# Patient Record
Sex: Male | Born: 1940 | Race: White | Hispanic: No | Smoking: Current every day smoker
Health system: Southern US, Community
[De-identification: ages and names within clinical notes are randomized; demographics above are authoritative.]

## PROBLEM LIST (undated history)

## (undated) DIAGNOSIS — I219 Acute myocardial infarction, unspecified: Secondary | ICD-10-CM

## (undated) DIAGNOSIS — I639 Cerebral infarction, unspecified: Secondary | ICD-10-CM

## (undated) DIAGNOSIS — I2510 Atherosclerotic heart disease of native coronary artery without angina pectoris: Secondary | ICD-10-CM

## (undated) DIAGNOSIS — I10 Essential (primary) hypertension: Secondary | ICD-10-CM

## (undated) DIAGNOSIS — I480 Paroxysmal atrial fibrillation: Secondary | ICD-10-CM

## (undated) DIAGNOSIS — E785 Hyperlipidemia, unspecified: Secondary | ICD-10-CM

## (undated) DIAGNOSIS — I739 Peripheral vascular disease, unspecified: Secondary | ICD-10-CM

## (undated) DIAGNOSIS — F0390 Unspecified dementia without behavioral disturbance: Secondary | ICD-10-CM

## (undated) DIAGNOSIS — N189 Chronic kidney disease, unspecified: Secondary | ICD-10-CM

## (undated) DIAGNOSIS — J449 Chronic obstructive pulmonary disease, unspecified: Secondary | ICD-10-CM

## (undated) DIAGNOSIS — I509 Heart failure, unspecified: Secondary | ICD-10-CM

## (undated) DIAGNOSIS — I6529 Occlusion and stenosis of unspecified carotid artery: Secondary | ICD-10-CM

## (undated) DIAGNOSIS — Z720 Tobacco use: Secondary | ICD-10-CM

## (undated) HISTORY — DX: Acute myocardial infarction, unspecified: I21.9

## (undated) HISTORY — DX: Chronic obstructive pulmonary disease, unspecified: J44.9

## (undated) HISTORY — DX: Heart failure, unspecified: I50.9

## (undated) HISTORY — DX: Hyperlipidemia, unspecified: E78.5

## (undated) HISTORY — DX: Occlusion and stenosis of unspecified carotid artery: I65.29

## (undated) HISTORY — DX: Chronic kidney disease, unspecified: N18.9

## (undated) HISTORY — DX: Essential (primary) hypertension: I10

---

## 1998-06-01 ENCOUNTER — Encounter: Payer: Self-pay | Admitting: Internal Medicine

## 1998-06-01 ENCOUNTER — Ambulatory Visit (HOSPITAL_COMMUNITY): Admission: RE | Admit: 1998-06-01 | Discharge: 1998-06-01 | Payer: Self-pay | Admitting: Internal Medicine

## 1998-07-20 ENCOUNTER — Ambulatory Visit (HOSPITAL_COMMUNITY): Admission: RE | Admit: 1998-07-20 | Discharge: 1998-07-20 | Payer: Self-pay | Admitting: *Deleted

## 2000-06-15 ENCOUNTER — Other Ambulatory Visit: Admission: RE | Admit: 2000-06-15 | Discharge: 2000-06-15 | Payer: Self-pay | Admitting: Urology

## 2000-06-15 ENCOUNTER — Encounter (INDEPENDENT_AMBULATORY_CARE_PROVIDER_SITE_OTHER): Payer: Self-pay | Admitting: Specialist

## 2001-03-08 ENCOUNTER — Encounter: Payer: Self-pay | Admitting: Internal Medicine

## 2001-03-08 ENCOUNTER — Encounter: Admission: RE | Admit: 2001-03-08 | Discharge: 2001-03-08 | Payer: Self-pay | Admitting: Internal Medicine

## 2001-08-13 ENCOUNTER — Encounter: Admission: RE | Admit: 2001-08-13 | Discharge: 2001-08-13 | Payer: Self-pay | Admitting: Specialist

## 2001-08-13 ENCOUNTER — Encounter: Payer: Self-pay | Admitting: Specialist

## 2001-12-26 ENCOUNTER — Inpatient Hospital Stay (HOSPITAL_COMMUNITY): Admission: RE | Admit: 2001-12-26 | Discharge: 2002-01-08 | Payer: Self-pay | Admitting: Surgery

## 2001-12-27 ENCOUNTER — Encounter (INDEPENDENT_AMBULATORY_CARE_PROVIDER_SITE_OTHER): Payer: Self-pay | Admitting: Cardiology

## 2002-01-02 ENCOUNTER — Encounter: Payer: Self-pay | Admitting: Surgery

## 2002-01-04 ENCOUNTER — Encounter: Payer: Self-pay | Admitting: Surgery

## 2002-01-04 HISTORY — PX: OTHER SURGICAL HISTORY: SHX169

## 2002-01-05 ENCOUNTER — Encounter: Payer: Self-pay | Admitting: Surgery

## 2002-01-06 ENCOUNTER — Encounter: Payer: Self-pay | Admitting: Surgery

## 2002-01-29 ENCOUNTER — Encounter: Admission: RE | Admit: 2002-01-29 | Discharge: 2002-01-29 | Payer: Self-pay | Admitting: Surgery

## 2002-01-29 ENCOUNTER — Encounter: Payer: Self-pay | Admitting: Surgery

## 2002-04-10 ENCOUNTER — Encounter: Payer: Self-pay | Admitting: Cardiovascular Disease

## 2002-04-10 ENCOUNTER — Inpatient Hospital Stay (HOSPITAL_COMMUNITY): Admission: EM | Admit: 2002-04-10 | Discharge: 2002-04-13 | Payer: Self-pay | Admitting: Emergency Medicine

## 2002-07-10 ENCOUNTER — Ambulatory Visit (HOSPITAL_COMMUNITY): Admission: RE | Admit: 2002-07-10 | Discharge: 2002-07-10 | Payer: Self-pay | Admitting: Cardiovascular Disease

## 2002-07-17 ENCOUNTER — Encounter (INDEPENDENT_AMBULATORY_CARE_PROVIDER_SITE_OTHER): Payer: Self-pay | Admitting: Specialist

## 2002-07-17 ENCOUNTER — Ambulatory Visit (HOSPITAL_COMMUNITY): Admission: RE | Admit: 2002-07-17 | Discharge: 2002-07-17 | Payer: Self-pay | Admitting: *Deleted

## 2002-09-10 ENCOUNTER — Encounter (INDEPENDENT_AMBULATORY_CARE_PROVIDER_SITE_OTHER): Payer: Self-pay | Admitting: Cardiology

## 2002-09-10 ENCOUNTER — Inpatient Hospital Stay (HOSPITAL_COMMUNITY): Admission: AD | Admit: 2002-09-10 | Discharge: 2002-09-18 | Payer: Self-pay

## 2002-09-10 ENCOUNTER — Encounter: Payer: Self-pay | Admitting: Cardiovascular Disease

## 2002-09-13 ENCOUNTER — Encounter: Payer: Self-pay | Admitting: Cardiovascular Disease

## 2002-09-17 ENCOUNTER — Encounter: Payer: Self-pay | Admitting: Cardiovascular Disease

## 2003-04-28 ENCOUNTER — Ambulatory Visit (HOSPITAL_COMMUNITY): Admission: RE | Admit: 2003-04-28 | Discharge: 2003-04-28 | Payer: Self-pay | Admitting: Neurosurgery

## 2003-07-11 ENCOUNTER — Encounter: Admission: RE | Admit: 2003-07-11 | Discharge: 2003-07-11 | Payer: Self-pay | Admitting: Neurosurgery

## 2003-10-06 ENCOUNTER — Ambulatory Visit (HOSPITAL_COMMUNITY): Admission: RE | Admit: 2003-10-06 | Discharge: 2003-10-06 | Payer: Self-pay | Admitting: *Deleted

## 2003-10-06 ENCOUNTER — Encounter (INDEPENDENT_AMBULATORY_CARE_PROVIDER_SITE_OTHER): Payer: Self-pay | Admitting: *Deleted

## 2003-11-03 ENCOUNTER — Ambulatory Visit: Admission: RE | Admit: 2003-11-03 | Discharge: 2003-11-03 | Payer: Self-pay | Admitting: Internal Medicine

## 2004-02-11 ENCOUNTER — Encounter: Admission: RE | Admit: 2004-02-11 | Discharge: 2004-02-11 | Payer: Self-pay | Admitting: Cardiovascular Disease

## 2004-02-16 ENCOUNTER — Ambulatory Visit (HOSPITAL_COMMUNITY): Admission: RE | Admit: 2004-02-16 | Discharge: 2004-02-16 | Payer: Self-pay | Admitting: Cardiovascular Disease

## 2004-04-11 ENCOUNTER — Emergency Department (HOSPITAL_COMMUNITY): Admission: EM | Admit: 2004-04-11 | Discharge: 2004-04-11 | Payer: Self-pay | Admitting: Emergency Medicine

## 2004-06-04 ENCOUNTER — Encounter: Admission: RE | Admit: 2004-06-04 | Discharge: 2004-06-04 | Payer: Self-pay | Admitting: Neurosurgery

## 2004-06-08 ENCOUNTER — Ambulatory Visit (HOSPITAL_COMMUNITY): Admission: RE | Admit: 2004-06-08 | Discharge: 2004-06-08 | Payer: Self-pay | Admitting: Neurosurgery

## 2004-06-17 ENCOUNTER — Ambulatory Visit (HOSPITAL_COMMUNITY): Admission: RE | Admit: 2004-06-17 | Discharge: 2004-06-17 | Payer: Self-pay | Admitting: Neurosurgery

## 2004-06-23 ENCOUNTER — Encounter: Admission: RE | Admit: 2004-06-23 | Discharge: 2004-06-23 | Payer: Self-pay | Admitting: Internal Medicine

## 2004-07-21 ENCOUNTER — Encounter: Admission: RE | Admit: 2004-07-21 | Discharge: 2004-07-21 | Payer: Self-pay | Admitting: Internal Medicine

## 2004-11-12 ENCOUNTER — Encounter: Admission: RE | Admit: 2004-11-12 | Discharge: 2004-11-12 | Payer: Self-pay | Admitting: Nephrology

## 2004-11-18 ENCOUNTER — Ambulatory Visit (HOSPITAL_COMMUNITY): Admission: RE | Admit: 2004-11-18 | Discharge: 2004-11-18 | Payer: Self-pay | Admitting: *Deleted

## 2004-11-18 ENCOUNTER — Encounter (INDEPENDENT_AMBULATORY_CARE_PROVIDER_SITE_OTHER): Payer: Self-pay | Admitting: *Deleted

## 2005-01-10 ENCOUNTER — Inpatient Hospital Stay (HOSPITAL_COMMUNITY): Admission: AD | Admit: 2005-01-10 | Discharge: 2005-01-12 | Payer: Self-pay | Admitting: Cardiovascular Disease

## 2005-07-02 ENCOUNTER — Emergency Department (HOSPITAL_COMMUNITY): Admission: EM | Admit: 2005-07-02 | Discharge: 2005-07-02 | Payer: Self-pay | Admitting: Emergency Medicine

## 2005-08-05 ENCOUNTER — Inpatient Hospital Stay (HOSPITAL_COMMUNITY): Admission: EM | Admit: 2005-08-05 | Discharge: 2005-08-07 | Payer: Self-pay | Admitting: Emergency Medicine

## 2005-11-03 ENCOUNTER — Emergency Department (HOSPITAL_COMMUNITY): Admission: EM | Admit: 2005-11-03 | Discharge: 2005-11-03 | Payer: Self-pay | Admitting: Emergency Medicine

## 2006-02-23 ENCOUNTER — Inpatient Hospital Stay (HOSPITAL_COMMUNITY): Admission: EM | Admit: 2006-02-23 | Discharge: 2006-02-25 | Payer: Self-pay | Admitting: Emergency Medicine

## 2006-05-23 ENCOUNTER — Encounter: Admission: RE | Admit: 2006-05-23 | Discharge: 2006-05-23 | Payer: Self-pay | Admitting: *Deleted

## 2006-07-12 ENCOUNTER — Encounter (INDEPENDENT_AMBULATORY_CARE_PROVIDER_SITE_OTHER): Payer: Self-pay | Admitting: Specialist

## 2006-07-12 ENCOUNTER — Ambulatory Visit (HOSPITAL_COMMUNITY): Admission: RE | Admit: 2006-07-12 | Discharge: 2006-07-12 | Payer: Self-pay | Admitting: *Deleted

## 2006-07-24 ENCOUNTER — Encounter: Payer: Self-pay | Admitting: Infectious Diseases

## 2006-08-09 ENCOUNTER — Encounter: Admission: RE | Admit: 2006-08-09 | Discharge: 2006-08-09 | Payer: Self-pay | Admitting: Neurosurgery

## 2006-09-29 ENCOUNTER — Ambulatory Visit (HOSPITAL_COMMUNITY): Admission: RE | Admit: 2006-09-29 | Discharge: 2006-09-29 | Payer: Self-pay | Admitting: Neurosurgery

## 2006-10-04 ENCOUNTER — Ambulatory Visit: Payer: Self-pay | Admitting: Infectious Diseases

## 2006-10-04 DIAGNOSIS — Z951 Presence of aortocoronary bypass graft: Secondary | ICD-10-CM | POA: Insufficient documentation

## 2006-10-04 DIAGNOSIS — E118 Type 2 diabetes mellitus with unspecified complications: Secondary | ICD-10-CM | POA: Insufficient documentation

## 2006-10-04 DIAGNOSIS — Z981 Arthrodesis status: Secondary | ICD-10-CM

## 2006-10-04 DIAGNOSIS — K219 Gastro-esophageal reflux disease without esophagitis: Secondary | ICD-10-CM | POA: Insufficient documentation

## 2006-10-04 DIAGNOSIS — N189 Chronic kidney disease, unspecified: Secondary | ICD-10-CM | POA: Insufficient documentation

## 2006-10-04 DIAGNOSIS — M869 Osteomyelitis, unspecified: Secondary | ICD-10-CM | POA: Insufficient documentation

## 2006-10-04 LAB — CONVERTED CEMR LAB
BUN: 51 mg/dL — ABNORMAL HIGH (ref 6–23)
CO2: 24 meq/L (ref 19–32)
CRP: 3.2 mg/dL — ABNORMAL HIGH (ref <0.6)
Calcium: 9 mg/dL (ref 8.4–10.5)
Chloride: 108 meq/L (ref 96–112)
Creatinine, Ser: 2.24 mg/dL — ABNORMAL HIGH (ref 0.40–1.50)
Eosinophils Absolute: 0.3 10*3/uL (ref 0.0–0.7)
Eosinophils Relative: 4 % (ref 0–5)
Glucose, Bld: 98 mg/dL (ref 70–99)
HCT: 43.5 % (ref 39.0–52.0)
Hemoglobin: 14.2 g/dL (ref 13.0–17.0)
Lymphs Abs: 2.2 10*3/uL (ref 0.7–3.3)
MCV: 103.3 fL — ABNORMAL HIGH (ref 78.0–100.0)
Monocytes Absolute: 0.6 10*3/uL (ref 0.2–0.7)
Monocytes Relative: 8 % (ref 3–11)
Platelets: 174 10*3/uL (ref 150–400)
Sed Rate: 23 mm/hr — ABNORMAL HIGH (ref 0–16)
Total Bilirubin: 0.5 mg/dL (ref 0.3–1.2)
WBC: 7.5 10*3/uL (ref 4.0–10.5)

## 2006-10-05 ENCOUNTER — Telehealth: Payer: Self-pay

## 2006-10-05 ENCOUNTER — Ambulatory Visit (HOSPITAL_COMMUNITY): Admission: RE | Admit: 2006-10-05 | Discharge: 2006-10-05 | Payer: Self-pay | Admitting: *Deleted

## 2006-10-05 ENCOUNTER — Encounter: Payer: Self-pay | Admitting: Infectious Diseases

## 2006-10-06 ENCOUNTER — Telehealth: Payer: Self-pay | Admitting: Infectious Diseases

## 2006-10-06 ENCOUNTER — Ambulatory Visit (HOSPITAL_COMMUNITY): Admission: RE | Admit: 2006-10-06 | Discharge: 2006-10-06 | Payer: Self-pay | Admitting: Infectious Diseases

## 2006-10-10 ENCOUNTER — Telehealth: Payer: Self-pay | Admitting: Infectious Diseases

## 2006-10-10 ENCOUNTER — Encounter: Payer: Self-pay | Admitting: Infectious Diseases

## 2006-10-11 ENCOUNTER — Encounter (HOSPITAL_COMMUNITY): Admission: RE | Admit: 2006-10-11 | Discharge: 2006-12-04 | Payer: Self-pay | Admitting: Infectious Diseases

## 2006-10-13 ENCOUNTER — Encounter: Payer: Self-pay | Admitting: Infectious Diseases

## 2006-10-16 ENCOUNTER — Telehealth: Payer: Self-pay | Admitting: Infectious Diseases

## 2006-10-24 ENCOUNTER — Telehealth: Payer: Self-pay | Admitting: Infectious Diseases

## 2006-10-30 ENCOUNTER — Encounter: Payer: Self-pay | Admitting: Infectious Diseases

## 2006-10-31 ENCOUNTER — Encounter: Payer: Self-pay | Admitting: Infectious Diseases

## 2006-11-07 ENCOUNTER — Encounter: Payer: Self-pay | Admitting: Infectious Diseases

## 2006-11-08 ENCOUNTER — Encounter (INDEPENDENT_AMBULATORY_CARE_PROVIDER_SITE_OTHER): Payer: Self-pay | Admitting: Internal Medicine

## 2006-11-08 ENCOUNTER — Observation Stay (HOSPITAL_COMMUNITY): Admission: EM | Admit: 2006-11-08 | Discharge: 2006-11-09 | Payer: Self-pay | Admitting: Emergency Medicine

## 2006-11-09 ENCOUNTER — Telehealth: Payer: Self-pay | Admitting: Infectious Diseases

## 2006-11-10 ENCOUNTER — Encounter: Payer: Self-pay | Admitting: Infectious Diseases

## 2006-11-14 ENCOUNTER — Encounter: Payer: Self-pay | Admitting: Infectious Diseases

## 2006-11-14 ENCOUNTER — Telehealth: Payer: Self-pay | Admitting: Infectious Diseases

## 2006-11-20 ENCOUNTER — Encounter: Admission: RE | Admit: 2006-11-20 | Discharge: 2006-11-20 | Payer: Self-pay | Admitting: Podiatry

## 2006-11-21 ENCOUNTER — Telehealth: Payer: Self-pay | Admitting: Infectious Diseases

## 2006-11-21 ENCOUNTER — Encounter: Payer: Self-pay | Admitting: Infectious Diseases

## 2006-12-25 ENCOUNTER — Ambulatory Visit: Payer: Self-pay | Admitting: Infectious Diseases

## 2006-12-25 LAB — CONVERTED CEMR LAB
BUN: 26 mg/dL — ABNORMAL HIGH (ref 6–23)
Basophils Absolute: 0 10*3/uL (ref 0.0–0.1)
CO2: 25 meq/L (ref 19–32)
Chloride: 103 meq/L (ref 96–112)
Creatinine, Ser: 1.55 mg/dL — ABNORMAL HIGH (ref 0.40–1.50)
Eosinophils Relative: 1 % (ref 0–5)
HCT: 45.7 % (ref 39.0–52.0)
Hemoglobin: 15.5 g/dL (ref 13.0–17.0)
Lymphocytes Relative: 22 % (ref 12–46)
Lymphs Abs: 2 10*3/uL (ref 0.7–3.3)
Monocytes Absolute: 0.6 10*3/uL (ref 0.2–0.7)
Neutro Abs: 6.5 10*3/uL (ref 1.7–7.7)
Potassium: 4.6 meq/L (ref 3.5–5.3)
RBC: 4.54 M/uL (ref 4.22–5.81)
Sed Rate: 15 mm/hr (ref 0–16)
WBC: 9.2 10*3/uL (ref 4.0–10.5)

## 2006-12-26 ENCOUNTER — Encounter: Admission: RE | Admit: 2006-12-26 | Discharge: 2006-12-26 | Payer: Self-pay | Admitting: Neurosurgery

## 2007-01-03 ENCOUNTER — Encounter: Admission: RE | Admit: 2007-01-03 | Discharge: 2007-01-03 | Payer: Self-pay | Admitting: Internal Medicine

## 2007-01-12 ENCOUNTER — Encounter: Payer: Self-pay | Admitting: Infectious Diseases

## 2007-07-12 ENCOUNTER — Encounter: Admission: RE | Admit: 2007-07-12 | Discharge: 2007-07-12 | Payer: Self-pay | Admitting: Cardiovascular Disease

## 2007-07-17 ENCOUNTER — Inpatient Hospital Stay (HOSPITAL_COMMUNITY): Admission: RE | Admit: 2007-07-17 | Discharge: 2007-07-27 | Payer: Self-pay | Admitting: Cardiovascular Disease

## 2007-07-17 ENCOUNTER — Encounter: Payer: Self-pay | Admitting: Infectious Diseases

## 2007-07-17 ENCOUNTER — Ambulatory Visit: Payer: Self-pay | Admitting: Infectious Diseases

## 2007-07-17 ENCOUNTER — Ambulatory Visit: Payer: Self-pay | Admitting: Vascular Surgery

## 2007-08-14 ENCOUNTER — Ambulatory Visit (HOSPITAL_COMMUNITY): Admission: RE | Admit: 2007-08-14 | Discharge: 2007-08-14 | Payer: Self-pay | Admitting: Infectious Diseases

## 2007-08-14 ENCOUNTER — Ambulatory Visit: Payer: Self-pay | Admitting: Infectious Diseases

## 2007-08-14 DIAGNOSIS — L0390 Cellulitis, unspecified: Secondary | ICD-10-CM

## 2007-08-14 DIAGNOSIS — L0291 Cutaneous abscess, unspecified: Secondary | ICD-10-CM | POA: Insufficient documentation

## 2007-08-14 LAB — CONVERTED CEMR LAB
Alkaline Phosphatase: 145 units/L — ABNORMAL HIGH (ref 39–117)
BUN: 36 mg/dL — ABNORMAL HIGH (ref 6–23)
Basophils Relative: 1 % (ref 0–1)
CO2: 24 meq/L (ref 19–32)
Creatinine, Ser: 1.61 mg/dL — ABNORMAL HIGH (ref 0.40–1.50)
Eosinophils Absolute: 0.3 10*3/uL (ref 0.0–0.7)
Eosinophils Relative: 4 % (ref 0–5)
Glucose, Bld: 123 mg/dL — ABNORMAL HIGH (ref 70–99)
HCT: 45.7 % (ref 39.0–52.0)
Lymphs Abs: 2.9 10*3/uL (ref 0.7–4.0)
MCHC: 33.9 g/dL (ref 30.0–36.0)
MCV: 96.8 fL (ref 78.0–100.0)
Monocytes Relative: 8 % (ref 3–12)
Neutrophils Relative %: 48 % (ref 43–77)
RBC: 4.72 M/uL (ref 4.22–5.81)
Total Bilirubin: 1 mg/dL (ref 0.3–1.2)
WBC: 7.5 10*3/uL (ref 4.0–10.5)

## 2007-08-15 ENCOUNTER — Encounter: Payer: Self-pay | Admitting: Infectious Diseases

## 2007-08-20 ENCOUNTER — Telehealth: Payer: Self-pay | Admitting: Infectious Diseases

## 2007-08-24 ENCOUNTER — Telehealth: Payer: Self-pay | Admitting: Infectious Diseases

## 2007-09-13 ENCOUNTER — Telehealth: Payer: Self-pay | Admitting: Infectious Diseases

## 2007-09-18 ENCOUNTER — Ambulatory Visit (HOSPITAL_COMMUNITY): Admission: RE | Admit: 2007-09-18 | Discharge: 2007-09-18 | Payer: Self-pay | Admitting: Infectious Diseases

## 2007-09-18 ENCOUNTER — Ambulatory Visit: Payer: Self-pay | Admitting: Infectious Diseases

## 2007-09-18 DIAGNOSIS — I739 Peripheral vascular disease, unspecified: Secondary | ICD-10-CM

## 2007-09-18 LAB — CONVERTED CEMR LAB
BUN: 18 mg/dL (ref 6–23)
CO2: 29 meq/L (ref 19–32)
Calcium: 9.2 mg/dL (ref 8.4–10.5)
Eosinophils Absolute: 0.5 10*3/uL (ref 0.0–0.7)
Eosinophils Relative: 7 % — ABNORMAL HIGH (ref 0–5)
Glucose, Bld: 117 mg/dL — ABNORMAL HIGH (ref 70–99)
HCT: 42.7 % (ref 39.0–52.0)
Hemoglobin: 14.2 g/dL (ref 13.0–17.0)
Lymphs Abs: 2.1 10*3/uL (ref 0.7–4.0)
MCHC: 33.3 g/dL (ref 30.0–36.0)
MCV: 101.2 fL — ABNORMAL HIGH (ref 78.0–100.0)
Monocytes Absolute: 0.6 10*3/uL (ref 0.1–1.0)
Monocytes Relative: 9 % (ref 3–12)
Neutrophils Relative %: 54 % (ref 43–77)
RBC: 4.22 M/uL (ref 4.22–5.81)
Sodium: 141 meq/L (ref 135–145)
WBC: 7.2 10*3/uL (ref 4.0–10.5)

## 2007-09-24 ENCOUNTER — Telehealth: Payer: Self-pay | Admitting: Infectious Diseases

## 2007-09-24 ENCOUNTER — Telehealth (INDEPENDENT_AMBULATORY_CARE_PROVIDER_SITE_OTHER): Payer: Self-pay | Admitting: Licensed Clinical Social Worker

## 2007-10-16 ENCOUNTER — Ambulatory Visit: Payer: Self-pay | Admitting: Vascular Surgery

## 2007-11-28 ENCOUNTER — Ambulatory Visit: Payer: Self-pay | Admitting: Infectious Diseases

## 2008-01-14 ENCOUNTER — Telehealth: Payer: Self-pay | Admitting: Infectious Diseases

## 2008-01-31 ENCOUNTER — Emergency Department (HOSPITAL_COMMUNITY): Admission: EM | Admit: 2008-01-31 | Discharge: 2008-01-31 | Payer: Self-pay | Admitting: Emergency Medicine

## 2008-02-26 ENCOUNTER — Ambulatory Visit: Payer: Self-pay | Admitting: Infectious Diseases

## 2008-02-26 ENCOUNTER — Ambulatory Visit (HOSPITAL_COMMUNITY): Admission: RE | Admit: 2008-02-26 | Discharge: 2008-02-26 | Payer: Self-pay | Admitting: Infectious Diseases

## 2008-04-23 ENCOUNTER — Ambulatory Visit (HOSPITAL_COMMUNITY): Admission: RE | Admit: 2008-04-23 | Discharge: 2008-04-23 | Payer: Self-pay | Admitting: *Deleted

## 2008-04-23 ENCOUNTER — Encounter (INDEPENDENT_AMBULATORY_CARE_PROVIDER_SITE_OTHER): Payer: Self-pay | Admitting: *Deleted

## 2008-05-08 ENCOUNTER — Ambulatory Visit: Payer: Self-pay | Admitting: Infectious Diseases

## 2008-07-11 ENCOUNTER — Encounter (HOSPITAL_BASED_OUTPATIENT_CLINIC_OR_DEPARTMENT_OTHER): Admission: RE | Admit: 2008-07-11 | Discharge: 2008-08-08 | Payer: Self-pay | Admitting: General Surgery

## 2008-07-16 ENCOUNTER — Ambulatory Visit (HOSPITAL_COMMUNITY): Admission: RE | Admit: 2008-07-16 | Discharge: 2008-07-16 | Payer: Self-pay | Admitting: General Surgery

## 2008-07-22 ENCOUNTER — Ambulatory Visit (HOSPITAL_COMMUNITY): Admission: RE | Admit: 2008-07-22 | Discharge: 2008-07-22 | Payer: Self-pay | Admitting: General Surgery

## 2008-07-26 ENCOUNTER — Inpatient Hospital Stay (HOSPITAL_COMMUNITY): Admission: EM | Admit: 2008-07-26 | Discharge: 2008-08-13 | Payer: Self-pay | Admitting: Emergency Medicine

## 2008-07-28 ENCOUNTER — Ambulatory Visit: Payer: Self-pay | Admitting: Vascular Surgery

## 2008-07-29 ENCOUNTER — Encounter: Payer: Self-pay | Admitting: Vascular Surgery

## 2008-07-29 ENCOUNTER — Encounter (INDEPENDENT_AMBULATORY_CARE_PROVIDER_SITE_OTHER): Payer: Self-pay | Admitting: Cardiology

## 2008-07-31 ENCOUNTER — Encounter: Payer: Self-pay | Admitting: Vascular Surgery

## 2008-07-31 HISTORY — PX: OTHER SURGICAL HISTORY: SHX169

## 2008-08-01 ENCOUNTER — Ambulatory Visit: Payer: Self-pay | Admitting: Physical Medicine & Rehabilitation

## 2008-08-07 ENCOUNTER — Encounter (INDEPENDENT_AMBULATORY_CARE_PROVIDER_SITE_OTHER): Payer: Self-pay | Admitting: Internal Medicine

## 2008-08-12 ENCOUNTER — Encounter (INDEPENDENT_AMBULATORY_CARE_PROVIDER_SITE_OTHER): Payer: Self-pay | Admitting: Cardiology

## 2008-08-19 ENCOUNTER — Ambulatory Visit: Payer: Self-pay | Admitting: Vascular Surgery

## 2008-10-09 ENCOUNTER — Inpatient Hospital Stay (HOSPITAL_COMMUNITY): Admission: EM | Admit: 2008-10-09 | Discharge: 2008-10-22 | Payer: Self-pay | Admitting: Emergency Medicine

## 2008-10-10 ENCOUNTER — Ambulatory Visit: Payer: Self-pay | Admitting: Infectious Diseases

## 2008-10-16 ENCOUNTER — Telehealth: Payer: Self-pay | Admitting: Infectious Diseases

## 2008-10-16 ENCOUNTER — Encounter (INDEPENDENT_AMBULATORY_CARE_PROVIDER_SITE_OTHER): Payer: Self-pay | Admitting: Internal Medicine

## 2008-10-17 ENCOUNTER — Encounter: Payer: Self-pay | Admitting: Internal Medicine

## 2008-10-27 ENCOUNTER — Telehealth: Payer: Self-pay | Admitting: Internal Medicine

## 2008-11-07 ENCOUNTER — Ambulatory Visit (HOSPITAL_COMMUNITY): Admission: RE | Admit: 2008-11-07 | Discharge: 2008-11-07 | Payer: Self-pay | Admitting: Internal Medicine

## 2008-11-12 ENCOUNTER — Telehealth: Payer: Self-pay | Admitting: Internal Medicine

## 2008-11-21 ENCOUNTER — Encounter: Admission: RE | Admit: 2008-11-21 | Discharge: 2009-01-13 | Payer: Self-pay | Admitting: Internal Medicine

## 2008-11-27 ENCOUNTER — Ambulatory Visit: Payer: Self-pay | Admitting: Internal Medicine

## 2008-11-27 DIAGNOSIS — A4102 Sepsis due to Methicillin resistant Staphylococcus aureus: Secondary | ICD-10-CM

## 2008-11-27 LAB — CONVERTED CEMR LAB
CO2: 26 meq/L (ref 19–32)
Calcium: 8.6 mg/dL (ref 8.4–10.5)
Creatinine, Ser: 0.88 mg/dL (ref 0.40–1.50)
Glucose, Bld: 144 mg/dL — ABNORMAL HIGH (ref 70–99)
Sed Rate: 76 mm/hr — ABNORMAL HIGH (ref 0–16)

## 2008-12-15 ENCOUNTER — Telehealth: Payer: Self-pay

## 2008-12-23 ENCOUNTER — Ambulatory Visit: Payer: Self-pay | Admitting: Internal Medicine

## 2008-12-23 ENCOUNTER — Encounter: Payer: Self-pay | Admitting: Infectious Diseases

## 2009-01-09 ENCOUNTER — Encounter (HOSPITAL_COMMUNITY): Admission: RE | Admit: 2009-01-09 | Discharge: 2009-01-13 | Payer: Self-pay | Admitting: Internal Medicine

## 2009-01-22 ENCOUNTER — Ambulatory Visit: Payer: Self-pay | Admitting: Internal Medicine

## 2009-03-24 ENCOUNTER — Ambulatory Visit: Payer: Self-pay | Admitting: Internal Medicine

## 2009-03-25 ENCOUNTER — Encounter: Payer: Self-pay | Admitting: Internal Medicine

## 2009-04-23 ENCOUNTER — Telehealth: Payer: Self-pay | Admitting: Internal Medicine

## 2009-04-23 ENCOUNTER — Ambulatory Visit: Payer: Self-pay | Admitting: Internal Medicine

## 2009-04-23 ENCOUNTER — Ambulatory Visit (HOSPITAL_COMMUNITY): Admission: RE | Admit: 2009-04-23 | Discharge: 2009-04-23 | Payer: Self-pay | Admitting: Internal Medicine

## 2009-04-23 LAB — CONVERTED CEMR LAB
BUN: 25 mg/dL — ABNORMAL HIGH (ref 6–23)
CO2: 24 meq/L (ref 19–32)
Chloride: 106 meq/L (ref 96–112)
Creatinine, Ser: 1.33 mg/dL (ref 0.40–1.50)
HCT: 37.5 % — ABNORMAL LOW (ref 39.0–52.0)
MCV: 98.9 fL (ref >=78.0)
RBC: 3.79 M/uL — ABNORMAL LOW (ref 4.22–5.81)
WBC: 5.8 10*3/uL (ref 4.0–10.5)

## 2009-04-30 ENCOUNTER — Ambulatory Visit: Payer: Self-pay | Admitting: Vascular Surgery

## 2009-04-30 ENCOUNTER — Encounter: Payer: Self-pay | Admitting: Infectious Diseases

## 2009-05-05 ENCOUNTER — Ambulatory Visit: Payer: Self-pay | Admitting: Internal Medicine

## 2009-06-05 ENCOUNTER — Encounter: Admission: RE | Admit: 2009-06-05 | Discharge: 2009-06-05 | Payer: Self-pay | Admitting: Gastroenterology

## 2009-07-07 ENCOUNTER — Ambulatory Visit: Payer: Self-pay | Admitting: Internal Medicine

## 2009-07-07 DIAGNOSIS — L89509 Pressure ulcer of unspecified ankle, unspecified stage: Secondary | ICD-10-CM | POA: Insufficient documentation

## 2009-08-26 ENCOUNTER — Encounter: Admission: RE | Admit: 2009-08-26 | Discharge: 2009-08-26 | Payer: Self-pay

## 2009-09-24 ENCOUNTER — Ambulatory Visit: Payer: Self-pay | Admitting: Vascular Surgery

## 2009-11-04 ENCOUNTER — Ambulatory Visit: Payer: Self-pay | Admitting: Vascular Surgery

## 2009-12-02 ENCOUNTER — Inpatient Hospital Stay (HOSPITAL_COMMUNITY)
Admission: EM | Admit: 2009-12-02 | Discharge: 2009-12-14 | Payer: Self-pay | Source: Home / Self Care | Admitting: Emergency Medicine

## 2009-12-03 ENCOUNTER — Ambulatory Visit: Payer: Self-pay | Admitting: Vascular Surgery

## 2009-12-03 ENCOUNTER — Encounter (INDEPENDENT_AMBULATORY_CARE_PROVIDER_SITE_OTHER): Payer: Self-pay | Admitting: Internal Medicine

## 2009-12-03 ENCOUNTER — Encounter: Payer: Self-pay | Admitting: Vascular Surgery

## 2009-12-04 ENCOUNTER — Encounter (INDEPENDENT_AMBULATORY_CARE_PROVIDER_SITE_OTHER): Payer: Self-pay | Admitting: Internal Medicine

## 2009-12-07 ENCOUNTER — Encounter (INDEPENDENT_AMBULATORY_CARE_PROVIDER_SITE_OTHER): Payer: Self-pay | Admitting: Internal Medicine

## 2009-12-07 HISTORY — PX: OTHER SURGICAL HISTORY: SHX169

## 2009-12-08 ENCOUNTER — Ambulatory Visit: Payer: Self-pay | Admitting: Physical Medicine & Rehabilitation

## 2010-01-06 ENCOUNTER — Ambulatory Visit: Payer: Self-pay | Admitting: Vascular Surgery

## 2010-03-24 ENCOUNTER — Encounter (HOSPITAL_COMMUNITY)
Admission: RE | Admit: 2010-03-24 | Discharge: 2010-04-06 | Payer: Self-pay | Source: Home / Self Care | Attending: Cardiovascular Disease | Admitting: Cardiovascular Disease

## 2010-03-28 ENCOUNTER — Encounter: Payer: Self-pay | Admitting: Neurosurgery

## 2010-03-28 ENCOUNTER — Encounter: Payer: Self-pay | Admitting: Neurology

## 2010-04-01 IMAGING — XA IR FLUORO GUIDE CV LINE*R*
1 series · 3 of 3 positions shown · non-contrast
Comparison: none

CLINICAL DATA: MRSA; retracted right upper extremity PICC line;
request is now made for exchange of existing  PICC for a new PICC

RIGHT UPPER EXTREMITY  PICC EXCHANGE UNDER FLUORO GUIDANCE
TECHNIQUE: The existing right upper extremity PICC entry site was
prepped and draped in the normal sterile fashion.  1% lidocaine was
used for local anesthesia.  The proximal segment of the PICC cath
was cut and a guide wire was placed and advanced to the SVC right
atrial junction.  The existing PICC cath was then removed and
exchanged out over a peel-away sheath for a new 44 cm double lumen
power PICC  with its tip lying in the SVC right atrial junction.
Fluoroscopy during the procedure and fluoro spot radiograph
confirms appropriate catheter position.  The catheter was flushed,
secured to the skin with Prolene sutures, and covered with a
sterile dressing.  No immediate complication.
Fluoroscopy Time: 1.3 minutes.

[Series 1: run · 3 of 3 slices shown]
[im 1/3]
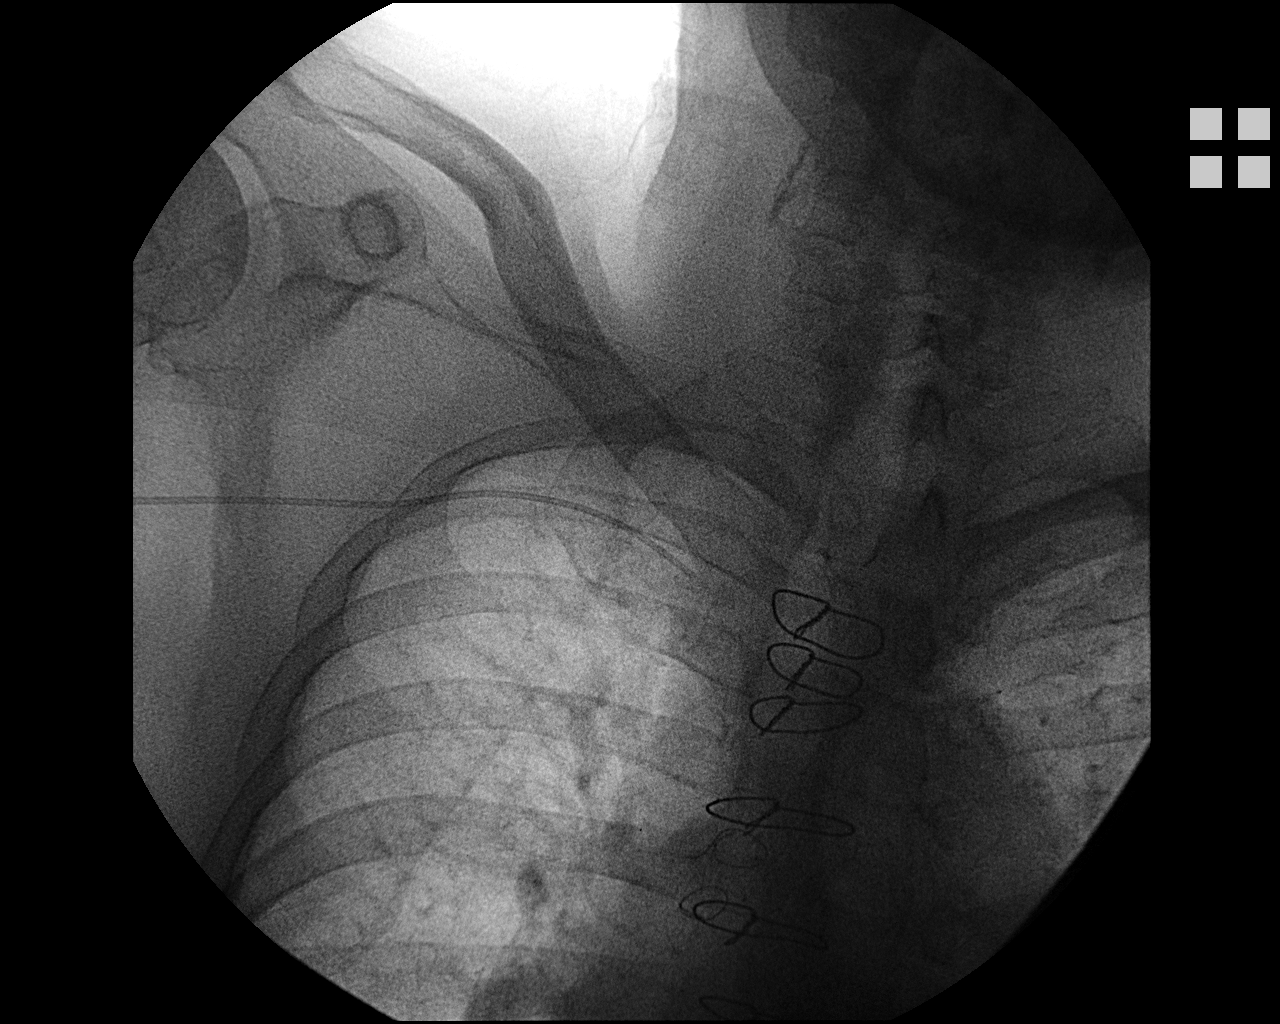
[im 2/3]
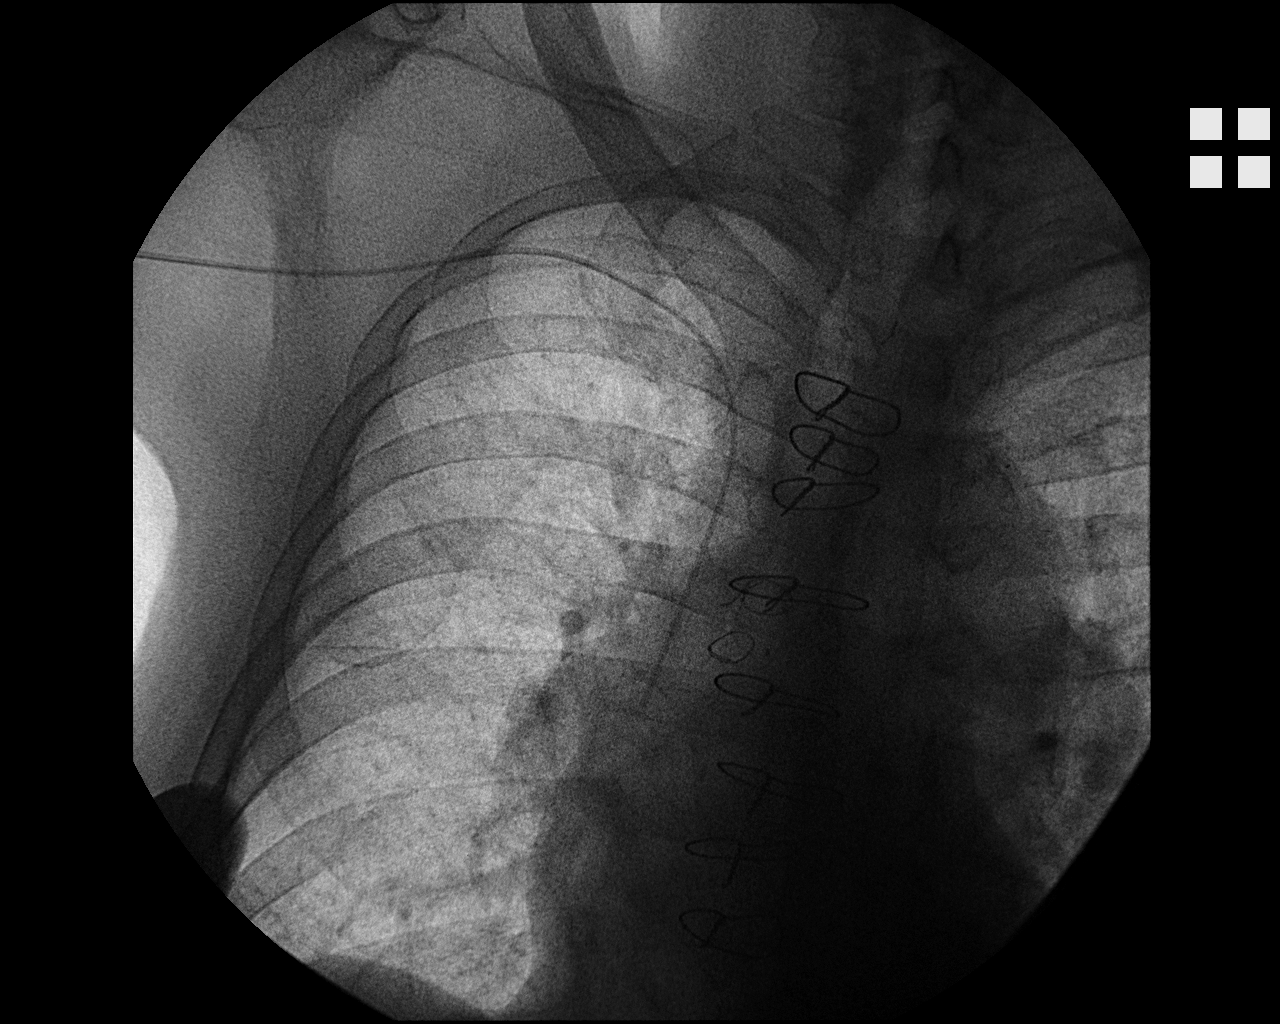
[im 3/3]
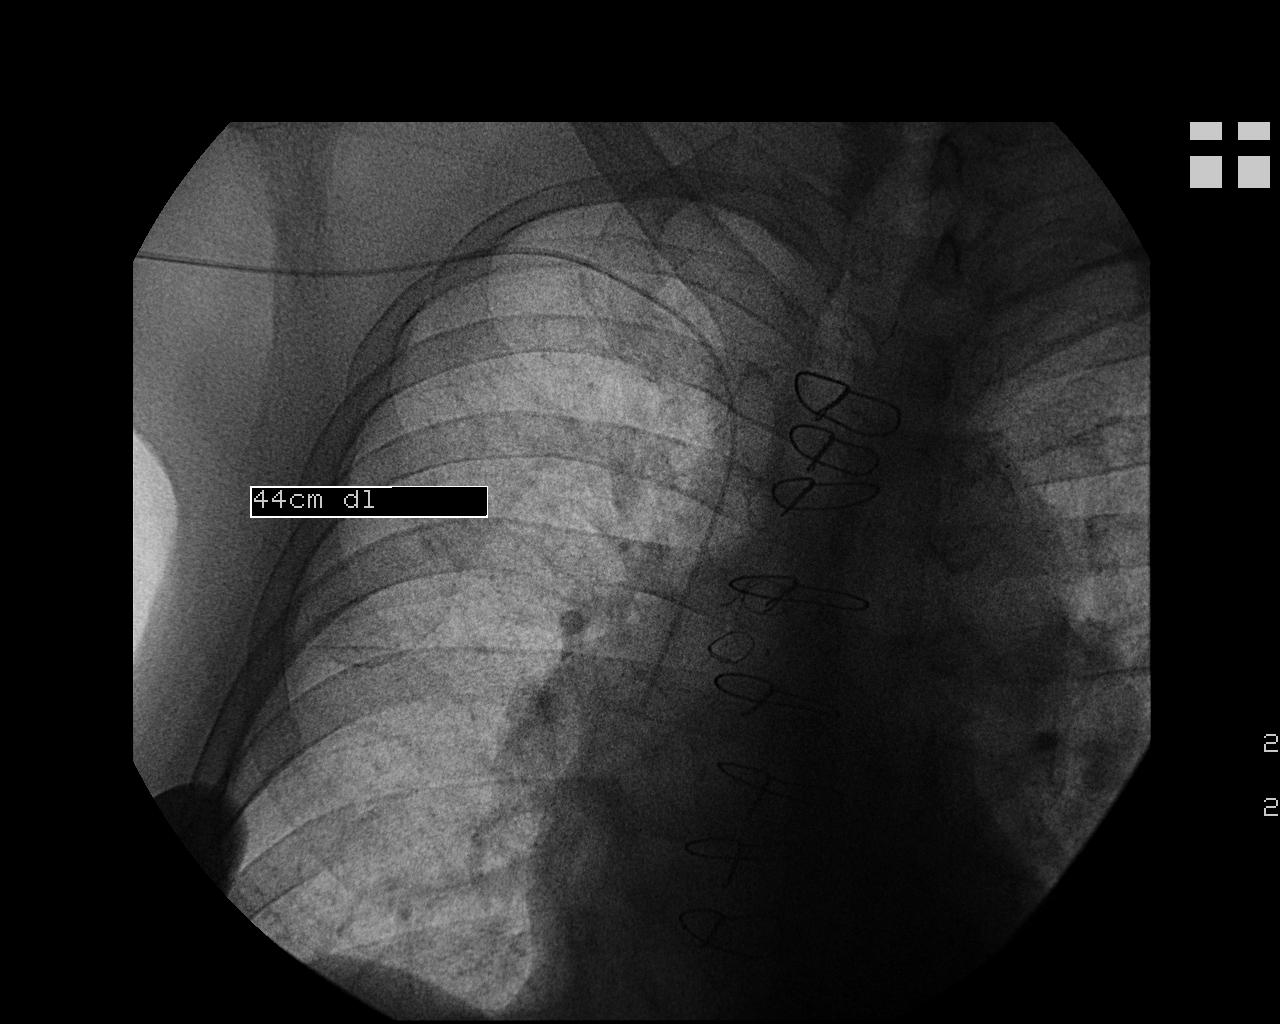

[3 of 3 positions shown; findings below may reference images not displayed]

IMPRESSION: Successful fluoroscopic guided exchange of existing right upper
extremity PICC for a new double lumen power PICC. .  The catheter
is ready for use.

Read by: Mazari, Ibraa.-AURREL

## 2010-04-04 LAB — CONVERTED CEMR LAB: Sed Rate: 16 mm/hr (ref 0–16)

## 2010-04-06 NOTE — Assessment & Plan Note (Signed)
Summary: 6WK F/U/VS   Primary Provider:  [name]  CC:  f/u visit .  History of Present Illness: [name] is in for his routine follow-up visit.  He has now been off of all antibiotics for about 2 1/2 months following nearly 3 months of antibiotic therapy for MRSA bacteremia and thoracic spine infection.  He has been off of his narcotic pain medication for one month.  He lost his balance about 3 weeks ago while climbing the ramp to his house and fell on his left side.  He has had more pain in his left hip since that time.  He has difficulty distinguishing between his various aches and pains but it appears that he has had no significant increase in his back pain.  He has had no fever, chills, or sweats.  Preventive Screening-Counseling & Management  Alcohol-Tobacco     Alcohol drinks/day: 1     Alcohol type: beer     Smoking Status: current     Smoking Cessation Counseling: yes     Packs/Day: 2.0     Passive Smoke Exposure: yes  Caffeine-Diet-Exercise     Caffeine use/day: 3     Does Patient Exercise: not on PT at home  Safety-Violence-Falls     Seat Belt Use: 0   Updated Prior Medication List: AMIODARONE HCL 200 MG  TABS (AMIODARONE HCL) Take 1 tablet by mouth once every other day.  On oppostie day take a half tablet. BENICAR 40 MG TABS (OLMESARTAN MEDOXOMIL) Take 1 tablet by mouth once a day FLOVENT HFA 220 MCG/ACT AERO (FLUTICASONE PROPIONATE  HFA) 1 puff two times a day GABAPENTIN 300 MG CAPS (GABAPENTIN) 1-2 daily HYDROXYZINE HCL 25 MG TABS (HYDROXYZINE HCL) 1-3 as needed itching REMERON 15 MG  TABS (MIRTAZAPINE) one at bedtime NIASPAN 1000 MG  TBCR (NIACIN (ANTIHYPERLIPIDEMIC)) Take 1 tablet by mouth once a day PRAVASTATIN SODIUM 40 MG  TABS (PRAVASTATIN SODIUM) Take 1 tablet by mouth once a day PRISTIQ 50 MG XR24H-TAB (DESVENLAFAXINE SUCCINATE) 1 daily PROTONIX 40 MG  PACK (PANTOPRAZOLE SODIUM) Take 1 tablet by mouth at bedtime ATIVAN 0.5 MG TABS (LORAZEPAM) Take  1/2-1 tablet by mouth two times a day BC FAST PAIN RELIEF 845-65 MG PACK (ASPIRIN-CAFFEINE) Take 1 tablet by mouth three times a day as needed for pain  Current Allergies (reviewed today): ! WELLBUTRIN Vital Signs:  Patient profile:   70 year old male Height:      72 inches (182.88 cm) Weight:      156.8 pounds (71.27 kg) BMI:     21.34 Temp:     97.5 degrees F (36.39 degrees C) oral Pulse (ortho):   67 / minute BP sitting:   171 / 75  (right arm) Cuff size:   large  Vitals Entered By: [name] (March 24, 2009 3:11 PM) CC: f/u visit  Is Patient Diabetic? Yes Did you bring your meter with you today? not checked this morning Pain Assessment Patient in pain? yes     Location: lower back Intensity: 7 Type: aching Onset of pain  Constant Nutritional Status BMI of 19 -24 = normal Nutritional Status Detail appetite "not good"  Have you ever been in a relationship where you felt threatened, hurt or afraid?not asked, daughter present   Does patient need assistance? Functional Status Self care Ambulation Wheelchair   Physical Exam  General:  alert and well-nourished.   Mouth:  poor dentition and teeth missing.   Lungs:  normal breath sounds, no  crackles, and no wheezes.   Heart:  normal rate, regular rhythm, and no murmur.   Skin:  he has multiple scabbed skin lesions.  There is a large scab on his right forearm from where his job scratched them.  He has a large scab on his left shin where he cut himself when he fell recently.  He has a chronic unchanged scattered lesion on his left lateral malleolus.  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  None of these lesions appear infected.   Impression & Recommendations:  Problem # 1:  INFECTION NOS, BONE, UNSPECIFIED SITE (ICD-730.90) [name] has now been off antibiotics for over two months.  I suspect that his hip pain is due to the fall and not due to a relapse of his staph infection.  I  will continue observation off of antibiotics and repeat a sed rate and C. reactive protein today.  They had completely normalized at the time of his last visit. The following medications were removed from the medication list:    Hydrocodone-acetaminophen 10-325 Mg Tabs (Hydrocodone-acetaminophen) ..... One by mouth every 4 hours as needed for pain His updated medication list for this problem includes:    Bc Fast Pain Relief 845-65 Mg Pack (Aspirin-caffeine) .Marland Kitchen... Take 1 tablet by mouth three times a day as needed for pain  Orders: Est. Patient Level IV (91478) T-C-Reactive Protein (29562-13086) T-Sed Rate (Automated) (57846-96295)  Patient Instructions: 1)  Please schedule a follow-up appointment in 1 month. Process Orders Check Orders Results:     [facility]: Check successful Tests Sent for requisitioning (March 24, 2009 3:44 PM):     03/24/2009: [facility] -- T-C-Reactive Protein [[id number]] (signed)     03/24/2009: [facility] -- T-Sed Rate (Automated) [id number] (signed)

## 2010-04-06 NOTE — Consult Note (Signed)
Summary: [facility]   Imported By: Florinda Marker 05/27/2009 09:11:02  _____________________________________________________________________  External Attachment:    Type:   Image     Comment:   External Document

## 2010-04-06 NOTE — Assessment & Plan Note (Signed)
Summary: 59month f/u/vs   Primary Provider:  [name]  CC:  follow-up visit, right foot lateral ulcer on ankle, and right foot edema.  History of Present Illness: [name] is in for his routine follow-up visit.  He has been off of antibiotics for about two months now and has not had any further fever, chills, or recurrent back pain.  However the small scabbed area on his left lateral ankle has recently enlarged and he has had a slight amount of yellow drainage.  He does not recall any trauma to the area.  Preventive Screening-Counseling & Management  Alcohol-Tobacco     Alcohol drinks/day: 1     Alcohol type: beer     Smoking Status: current     Smoking Cessation Counseling: yes     Packs/Day: 2.0     Passive Smoke Exposure: yes  Caffeine-Diet-Exercise     Caffeine use/day: 3     Does Patient Exercise: not on PT at home  Safety-Violence-Falls     Seat Belt Use: 0   Current Allergies (reviewed today): ! WELLBUTRIN Vital Signs:  Patient profile:   70 year old male Height:      72 inches (182.88 cm) Weight:      160.8 pounds (73.09 kg) BMI:     21.89 Temp:     97.5 degrees F (36.39 degrees C) oral Pulse rate:   64 / minute BP sitting:   135 / 68  (right arm) Cuff size:   large  Vitals Entered By: [name] RN (April 23, 2009 11:00 AM) CC: follow-up visit, right foot lateral ulcer on ankle, right foot edema Is Patient Diabetic? Yes Did you bring your meter with you today? 115 yesterday Pain Assessment Patient in pain? yes     Location: lower back Intensity: 5 Type: aching Onset of pain  Constant Nutritional Status BMI of 19 -24 = normal  Have you ever been in a relationship where you felt threatened, hurt or afraid?not asked daughter present   Does patient need assistance? Functional Status Cook/clean, Shopping, Social activities Ambulation Impaired:Risk for fall Comments artificial lower leg, uses wheelchair most days    Physical  Exam  General:  alert and well-nourished.  he is in his wheelchair. Extremities:  the small superficial scab on the left than ankle laterally has now turned into a dime-sized ulcer with surrounding erythema.  There is a small amount of yellow dressing on a gauze dressing.  I cannot express any drainage from the ulcer.  There is no fluctuance.   Impression & Recommendations:  Problem # 1:  CELLULITIS AND ABSCESS OF UNSPECIFIED SITE (ICD-682.9) I suspect that his MRSA bacteremia and thoracic spine infection are cured.  His sed rate and C. reactive protein at the time of his visit last month had normalized. However, he has developed a new left ankle ulcer her that has mild surrounding cellulitis.  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  I have told him and his daughter that if he continues to smoke it will be very difficult to cure this small ulcer.  I will start him back on doxycycline, check lab work and a plain x-ray today and attempt to arrange reevaluation  with Dr. Cari Caraway, his vascular surgeon. His updated medication list for this problem includes:    Bc Fast Pain Relief 845-65 Mg Pack (Aspirin-caffeine) .Marland Kitchen... Take 1 tablet by mouth three times a day as needed for pain    Doxycycline Hyclate 100 Mg Caps (Doxycycline hyclate) .Marland Kitchen... Take 1 capsule by mouth two times a day  Orders: Est. Patient Level III (04540) T-Basic Metabolic Panel ([id number]) T-CBC No Diff ([id number]) T-C-Reactive Protein ([id number]) T-Sed Rate (Automated) ([id number]) Diagnostic X-Ray/Fluoroscopy (Diagnostic X-Ray/Flu)  Medications Added to Medication List This Visit: 1)  Ativan 0.5 Mg Tabs (Lorazepam) ....  As needed only per pt. 2)  Doxycycline Hyclate 100 Mg Caps (Doxycycline hyclate) .... Take 1 capsule by mouth two times a day  Patient Instructions: 1)  Please schedule a follow-up appointment in 2 weeks, 05/05/09. Prescriptions: DOXYCYCLINE HYCLATE 100 MG CAPS (DOXYCYCLINE HYCLATE) Take 1 capsule by mouth two times a day  #28 x 0   Entered and Authorized by:   Cliffton Asters MD   Signed by:   Cliffton Asters MD on 04/23/2009   Method used:   Print then Give to Patient   RxID:   [id number]  Process Orders Check Orders Results:     [facility]: Check successful Tests Sent for requisitioning (April 23, 2009 12:20 PM):     04/23/2009: [facility] -- T-Basic Metabolic Panel [id number] (signed)     04/23/2009: [facility] -- T-CBC No Diff [[id number]] (signed)     04/23/2009: [facility] -- T-C-Reactive Protein [phone] (signed)     04/23/2009: [facility] -- T-Sed Rate (Automated) [[id number]] (signed)   Prevention & Chronic Care Immunizations   Influenza vaccine: Not documented    Tetanus booster: Not documented    Pneumococcal vaccine: Not documented    H. zoster vaccine: Not documented  Colorectal Screening   Hemoccult: Not documented    Colonoscopy: Not documented  Other Screening   PSA: Not documented   Smoking status: current  (04/23/2009)   Smoking cessation counseling: yes  (04/23/2009)  Diabetes Mellitus   HgbA1C: Not documented    Eye exam: Not documented    Foot exam: Not documented   High risk foot: Not documented   Foot care education: Not documented    Urine microalbumin/creatinine ratio: Not documented  Lipids   Total Cholesterol: Not documented   LDL: Not documented   LDL Direct: Not documented   HDL: Not documented   Triglycerides: Not documented  Self-Management Support :    Diabetes self-management support: Not documented

## 2010-04-06 NOTE — Assessment & Plan Note (Signed)
Summary: 2WK F/U/PER DR [name]/VS   Primary Provider:  [name]  CC:  follow-up visit, wound on left outer ankle, dime-sized with erythema on periphery, whitish center witih moist-appearing eschar, and very little yellowish exudate on 2x2 gauze that was removed .  History of Present Illness: [name] is in for his follow-up visit.  He has 2 1/2 more days of his doxycycline to complete.  He says that he feels like the redness and swelling of his left foot have improved.  After his last visit he and his daughter returned home and noticed that his wheelchair or did tend to rub his left ankle when he was getting up.  Since adjusted it so this is not as likely to happen.  He saw Dr. Edilia [name] again  who felt that it was reasonable to continue observation as long as his foot and ulcer were improving, but he said that he would have a very low threshold for arteriography if he is not improving.  [name] continues to smoke cigarettes.  Preventive Screening-Counseling & Management  Alcohol-Tobacco     Alcohol drinks/day: 1     Alcohol type: beer     Smoking Status: current     Smoking Cessation Counseling: yes     Packs/Day: 2.0     Passive Smoke Exposure: yes  Caffeine-Diet-Exercise     Caffeine use/day: 3     Does Patient Exercise: not on PT at home   Current Allergies (reviewed today): ! [facility] Vital Signs:  Patient profile:   70 year old male Height:      72 inches (182.88 cm) Weight:      159.0 pounds (72.27 kg) BMI:     21.64 Temp:     97.5 degrees F (36.39 degrees C) oral Pulse rate:   62 / minute BP sitting:   152 / 68  (left arm)  Vitals Entered By: Bryan Maduro RN (May 05, 2009 10:45 AM) CC: follow-up visit, wound on left outer ankle, dime-sized with erythema on periphery, whitish center witih moist-appearing eschar, very little yellowish exudate on 2x2 gauze that was removed  Is Patient Diabetic? Yes Did you bring your meter with you today? No, doesn't  check regularly Pain Assessment Patient in pain? no      Nutritional Status BMI of 19 -24 = normal Nutritional Status Detail appetite "not good"  Have you ever been in a relationship where you felt threatened, hurt or afraid?not asked   Does patient need assistance? Functional Status Cook/clean, Shopping Ambulation Impaired:Risk for fall, Wheelchair   Physical Exam  General:  alert and well-nourished.  he is in his wheelchair. Extremities:  there is less erythema around his left lateral ankle ulcer or hand over the dorsum of his foot.  The swelling of his foot had also improved.  However the ulcer has increased in size and now measures 2 x 1.5 cm.  The center of the ulcer is heaped up area of fairly firm but yellow tissue.   Impression & Recommendations:  Problem # 1:  CELLULITIS AND ABSCESS OF UNSPECIFIED SITE (ICD-682.9) is mild cellulitis has improved with empiric doxycycline.  Her recent x-ray did not reveal any evidence of osteomyelitis.  His sed rate was normal and his C. reactive protein was only slightly elevated.  However I am very concerned about his ability to heal this ulcer.  I've encouraged him to do everything he possibly can to quit smoking cigarettes.  He plans to follow up with Dr. Christiane Ha  Allyson [name]  and I would agree with Dr. Adele [name] low threshold for diagnostic arteriography of his left leg.  I will have him stop doxycycline when he runs out in two and one half days. His updated medication list for this problem includes:    Bc Fast Pain Relief 845-65 Mg Pack (Aspirin-caffeine) .Marland Kitchen... Take 1 tablet by mouth three times a day as needed for pain    Doxycycline Hyclate 100 Mg Caps (Doxycycline hyclate) .Marland Kitchen... Take 1 capsule by mouth two times a day  Orders: Est. Patient Level III (16109)  Patient Instructions: 1)  Please schedule a follow-up appointment in 2 months.  Prevention & Chronic Care Immunizations   Influenza vaccine: Not documented    Tetanus booster: Not  documented    Pneumococcal vaccine: Not documented    H. zoster vaccine: Not documented  Colorectal Screening   Hemoccult: Not documented    Colonoscopy: Not documented  Other Screening   PSA: Not documented   Smoking status: current  (05/05/2009)   Smoking cessation counseling: yes  (05/05/2009)  Diabetes Mellitus   HgbA1C: Not documented    Eye exam: Not documented    Foot exam: Not documented   High risk foot: Not documented   Foot care education: Not documented    Urine microalbumin/creatinine ratio: Not documented  Lipids   Total Cholesterol: Not documented   LDL: Not documented   LDL Direct: Not documented   HDL: Not documented   Triglycerides: Not documented  Self-Management Support :    Diabetes self-management support: Not documented

## 2010-04-06 NOTE — Assessment & Plan Note (Signed)
Summary: F/U/VS   Primary Provider:  [name]  CC:  follow-up visit.  History of Present Illness: [name] is in for his routine visit.  He has now been off of doxycycline and all other antibiotics for two months.  He continues to have problems with a poorly healing left lateral ankle ulcer but has not had any fever changes in the ulcers suggesting infection.  He saw Dr. Daphene Jaeger recently for some cardiac testing but does not know the results.  He has not seen any of his other doctors since his last visit with me.  He continues to smoke 1 1/2 packs of cigarettes daily and does not believe he is able to quit.  Preventive Screening-Counseling & Management  Alcohol-Tobacco     Alcohol drinks/day: 1     Alcohol type: beer     Smoking Status: current     Smoking Cessation Counseling: yes     Packs/Day: 1.5     Passive Smoke Exposure: yes  Caffeine-Diet-Exercise     Caffeine use/day: 3     Does Patient Exercise: not on PT at home  Safety-Violence-Falls     Seat Belt Use: 0   Updated Prior Medication List: AMIODARONE HCL 200 MG  TABS (AMIODARONE HCL) Take 1 tablet by mouth once every other day.  On oppostie day take a half tablet. BENICAR 40 MG TABS (OLMESARTAN MEDOXOMIL) Take 1 tablet by mouth once a day FLOVENT HFA 220 MCG/ACT AERO (FLUTICASONE PROPIONATE  HFA) 1 puff two times a day GABAPENTIN 300 MG CAPS (GABAPENTIN) 1-2 daily REMERON 15 MG  TABS (MIRTAZAPINE) one at bedtime NIASPAN 1000 MG  TBCR (NIACIN (ANTIHYPERLIPIDEMIC)) Take 1 tablet by mouth once a day PRAVASTATIN SODIUM 40 MG  TABS (PRAVASTATIN SODIUM) Take 1 tablet by mouth once a day PROTONIX 40 MG  PACK (PANTOPRAZOLE SODIUM) Take 1 tablet by mouth at bedtime ATIVAN 0.5 MG TABS (LORAZEPAM) as needed only per pt. BC FAST PAIN RELIEF 845-65 MG PACK (ASPIRIN-CAFFEINE) Take 1 tablet by mouth three times a day as needed for pain CITALOPRAM HYDROBROMIDE 20 MG TABS (CITALOPRAM HYDROBROMIDE) Take 1 tablet by mouth  once a day  Current Allergies (reviewed today): ! WELLBUTRIN Vital Signs:  Patient profile:   70 year old male Height:      72 inches (182.88 cm) Weight:      162 pounds (73.64 kg) BMI:     22.05 Temp:     98.2 degrees F (36.78 degrees C) oral Pulse rate:   56 / minute BP sitting:   182 / 81  (left arm) Cuff size:   regular  Vitals Entered By: [name] RN (Jul 07, 2009 10:25 AM) CC: follow-up visit Is Patient Diabetic? No Pain Assessment Patient in pain? yes     Location: stomach, back Intensity: 7 Type: aching Onset of pain  gallstones, intermitent Nutritional Status BMI of 19 -24 = normal Nutritional Status Detail appetite "not good"  Have you ever been in a relationship where you felt threatened, hurt or afraid?not asked   Does patient need assistance? Functional Status Self care Ambulation Normal   Physical Exam  General:  alert.   Extremities:  the left lateral ankle ulcer is slightly smaller and measures about 1.5 cm in diameter.  There is no erythema or other signs of infection.   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  He has no signs of infection at this time I will continue observation off of antibiotics.  I suggested that he follow up with Dr. Nanetta Batty if the ulcer is not healing to see if he recommends repeat arteriogram. The following medications were removed from the medication list:    Doxycycline Hyclate 100 Mg Caps (Doxycycline hyclate) .Marland Kitchen... Take 1 capsule by mouth two times a day His updated medication list for this problem includes:    Bc Fast Pain Relief 845-65 Mg Pack (Aspirin-caffeine) .Marland Kitchen... Take 1 tablet by mouth three times a day as needed for pain  Orders: Est. Patient Level III (30865)  Medications Added to Medication List This Visit: 1)  Citalopram Hydrobromide 20 Mg  Tabs (Citalopram hydrobromide) .... Take 1 tablet by mouth once a day  Patient Instructions: 1)  Please schedule a follow-up appointment as needed.

## 2010-04-06 NOTE — Progress Notes (Signed)
Summary: Appt w/ Dr. Edilia Bo, 04/30/2009 @ 1500 (dopplers) 1530 MD  Phone Note Outgoing Call   Call placed by: [name] RN,  April 23, 2009 12:34 PM Call placed to: Dr. Cristal Deer Dickson's office Summary of Call: Left lower extremity ankle ulcer.  Dr. Orvan Falconer requesting appt. with Dr. Edilia Bo to evaluate left lower extremity. Message left on appt. secretary's phone line. [name] RN  April 23, 2009 12:36 PM [name] office returned call.  Appt. given for Thrusday, Feb. 24, 2011 @ 1500 for Dopplers and 1530 to see Dr. Edilia Bo.  Phone message left for daughter, Joanthan Hlavacek with Appt. information.  [name] to call [name] office if she needs to change these appts. [name] RN  April 23, 2009 2:48 PM

## 2010-04-28 NOTE — Consult Note (Signed)
NAME:  [name], [name] NO.:  [id number]  MEDICAL RECORD NO.:  [id number]           PATIENT TYPE:  LOCATION:                                 FACILITY:  PHYSICIAN:  Nanetta Batty, M.D.   DATE OF BIRTH:  03-20-1940  DATE OF CONSULTATION:  12/03/2009 DATE OF DISCHARGE:                                CONSULTATION   CHIEF COMPLAINT:  Symptomatic bradycardia/hypotension.  HISTORY OF PRESENT ILLNESS:  I have a 70 year old pleasant white male known to [facility] needing cardiac evaluation due to presenting chief complaint of exacerbated general weakness over the past 24 hours.  The patient denies any exacerbating or alleviating factors.  The patient denies any related near syncopal and/or syncopal events.  The patient also denies any associated chest pain, anginal equivalence, DOE, PMD, pillow orthopnea and/or pedal edema of the left lower extremity.  The patient also denies any concomitant unexplained weight gain.  The patient states he has been compliant and tolerant of prescribed therapeutic regimen which includes multiple antiarrhythmics as well as antihypertensive medications.  Upon evaluation in the ED, the patient was found to be borderline hypotensive, sinus bradycardia at 54 beats per minute.  The patient denies any associated fever, chills and/or frank GI blood loss. The patient has a significant coronary history to include known multivessel CAD, history status post CABG x3 vessels, hypertension, atrial fibrillation, CHF with reported EF of 45%- 50% as well as peripheral vascular disease.  Other known ACS risk factors include type 2 diabetes, dyslipidemia, COPD, chronic kidney disease.  PAST MEDICAL HISTORY: 1. 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 2. Type 2 diabetes. 3. Hypertension. 4. Dyslipidemia. 5. CAD. 6. CHF. 7. COPD, on O2 and is prednisone dependent. 8. Stage II CKD. 9. PVD. 10.Atrial  fibrillation. 11.ACD. 12.Status post CABG x3 vessels. 13.Osteoporosis. 14.History of MRSA bacteremia.  SOCIAL HISTORY:  The patient is a reformed smoker.  Denies any use of illicit drugs and the use of consumption of alcohol beverages.  FAMILY HISTORY:  Negative for any family history of premature CAD, acute MI, CVA or TIA in family members.  ALLERGIES MULTIPLE:  Cymbalta, Ibuprofen, IVP dye, iodine.  HOME MEDICATIONS: 1. BC powders p.r.n. 2. Zantac 150 mg b.i.d. 3. Ranexa 500 mg daily. 4. Pravastatin 80 mg daily. 5. MiraLAX 17 g q.i.d. 6. Omeprazole 20 mg b.i.d. 7. Niaspan 500 mg q.h.s. 9. Mirtazapine 50 mg q.h.s. 10.Imdur 60 mg daily. 11.Hydrocodone p.r.n. 12.Gabapentin 300 mg t.i.d. 13.Flovent 220 mcg 1 puff b.i.d. 14.Citalopram 40 mg daily. 15.Carvedilol 6.25 mg b.i.d. 16.Benicar HCT 40/12.5 mg daily. 17.Ambien 100 mg daily.  REVIEW OF SYSTEMS:  CARDIOVASCULAR:  History of present illness. ALLERGIES:  Cymbalta, Ibuprofen, IVP dye, iodine.  PULMONARY:  Positive for COPD.  Negative for current shortness of breath, cough, wheezing, TB.  No history of hemoptysis.  GI:  Positive for GERD.  Negative for PED, IBS, diverticulitis/diverticulosis, cholecystitis, pancreatitis, history of colonic polyp or mass.  NEUROLOGICAL:  Negative for CVA, TIA, seizure disorder, syncopal spell.  ENDOCRINE:  Positive for diabetes, dyslipidemia, osteoporosis.  Negative for gout.  No thyroid disease. GENITOURINARY:  Positive for  CKD type 2.  Negative for BPH, hematuria. Positive for nephrolithiasis and history of renal cell CA.  Rest of 12- point review of systems unremarkable.  PHYSICAL EXAMINATION:  GENERAL:  I have a 70 year old male, alert and oriented x3 spheres in no acute distress. VITAL SIGNS:  Afebrile with a blood pressure currently 94/46, heart rate 49, respirations 12.  PERTINENT LABORATORY DATA:  Sodium 139, potassium 3.8, chloride 108, CO2 24, glucose 104, total calcium 7.9.   BUN and creatinine 39 and 2.57, respectively.  STUDIES:  Chest x-ray performed without any acute cardiopulmonary changes noted.  Cardiac enzymes x1, troponins less than 0.05.  CK MB less than 1.  White count 9,000.  H AND H 11 and 35, platelet count 137. EKG sinus brady without any acute ST-T wave changes consistent with infarction and ongoing ischemia. NECK:  Soft, supple without any appreciable JVD and/or palpable goiter. LUNGS:  Decreased aeration at bases bilaterally with scant expiratory wheezing noted. HEART:  S1, S2 without appreciable gallops or rubs. EXTREMITIES:  No edema to the left lower extremity.  Dorsalis pedis pulse 1/4. NEUROLOGICAL:  The patient neurologically intact.  Cranial nerves II-XII grossly intact.  Patient following simple commands and mentating appropriately.  ASSESSMENT/PLAN: 1. Symptomatic bradycardia, rule out possible acute coronary syndrome.     Other differential diagnoses consider possible iatrogenic secondary     to patient's multiple antihypertensive antiarrhythmics.  Also,     consider possible tachybrady syndrome.  Plan is to continue to     cycle cardiac enzymes q.6 hours x3 total.  Also, check BNP with     a.m. labs.  Will assess for 2D echo with [facility] to read.  Left ventricular systolic function without any     disease in the patient's native valves.  Will also hold beta     blocker therapy as well as the patient's Benicar and Imdur.  Showed     a possible EP evaluation for TBS if the patient does not rule in. 2. Hypotension, likely multifactorial considered secondary to     dehydration.  Also, consider possible sepsis and/or secondary     iatrogenic cause due to patient's known multiple antihypertensive     agents. 3. Known multivitamin coronary artery disease status post coronary     artery bypass graft x3 vessels.  Plan was ACS protocol initiated.     Will defer use of nitrates and/or beta-blocker therapy  due to     patient's current hypotensive state. 4. Ejection fraction of 45%-50%.  Plan will be to repeat 2D echo in     a.m. 5. History of atrial fibrillation. 6. Peripheral vascular disease. 7. Stage II chronic kidney disease, likely acute on chronic     exacerbation secondary to volume depletion. 8. [place] Heart Association and Class II systolic congestive heart     failure.  Currently compensated.    ______________________________ Donell Sievert, PA   ______________________________ Nanetta Batty, M.D.    SS/MEDQ  D:  12/03/2009  T:  12/03/2009  Job:  [id number]  cc:   Dr. Charolett Bumpers, MD  Electronically Signed by Donell Sievert PA on 04/22/2010 07:32:30 AM Electronically Signed by Nanetta Batty M.D. on 04/28/2010 11:34:49 AM

## 2010-05-05 ENCOUNTER — Ambulatory Visit: Payer: Self-pay | Admitting: Vascular Surgery

## 2010-05-19 LAB — BASIC METABOLIC PANEL
BUN: 12 mg/dL (ref 6–23)
BUN: 13 mg/dL (ref 6–23)
BUN: 13 mg/dL (ref 6–23)
BUN: 14 mg/dL (ref 6–23)
BUN: 21 mg/dL (ref 6–23)
CO2: 24 mEq/L (ref 19–32)
CO2: 29 mEq/L (ref 19–32)
CO2: 29 mEq/L (ref 19–32)
CO2: 31 mEq/L (ref 19–32)
CO2: 31 mEq/L (ref 19–32)
Calcium: 8.4 mg/dL (ref 8.4–10.5)
Calcium: 8.4 mg/dL (ref 8.4–10.5)
Calcium: 8.6 mg/dL (ref 8.4–10.5)
Calcium: 8.6 mg/dL (ref 8.4–10.5)
Calcium: 8.6 mg/dL (ref 8.4–10.5)
Calcium: 8.7 mg/dL (ref 8.4–10.5)
Calcium: 9.3 mg/dL (ref 8.4–10.5)
Chloride: 103 mEq/L (ref 96–112)
Chloride: 104 mEq/L (ref 96–112)
Chloride: 104 mEq/L (ref 96–112)
Chloride: 106 mEq/L (ref 96–112)
Creatinine, Ser: 1.23 mg/dL (ref 0.4–1.5)
Creatinine, Ser: 1.37 mg/dL (ref 0.4–1.5)
Creatinine, Ser: 1.69 mg/dL — ABNORMAL HIGH (ref 0.4–1.5)
GFR calc Af Amer: 49 mL/min — ABNORMAL LOW (ref >60)
GFR calc Af Amer: 57 mL/min — ABNORMAL LOW (ref >60)
GFR calc Af Amer: >60 mL/min (ref >60)
GFR calc Af Amer: >60 mL/min (ref >60)
GFR calc non Af Amer: 43 mL/min — ABNORMAL LOW (ref >60)
GFR calc non Af Amer: 50 mL/min — ABNORMAL LOW (ref >60)
GFR calc non Af Amer: 51 mL/min — ABNORMAL LOW (ref >60)
GFR calc non Af Amer: 58 mL/min — ABNORMAL LOW (ref >60)
Glucose, Bld: 104 mg/dL — ABNORMAL HIGH (ref 70–99)
Glucose, Bld: 105 mg/dL — ABNORMAL HIGH (ref 70–99)
Glucose, Bld: 123 mg/dL — ABNORMAL HIGH (ref 70–99)
Glucose, Bld: 137 mg/dL — ABNORMAL HIGH (ref 70–99)
Glucose, Bld: 93 mg/dL (ref 70–99)
Potassium: 3.6 mEq/L (ref 3.5–5.1)
Potassium: 3.6 mEq/L (ref 3.5–5.1)
Potassium: 3.8 mEq/L (ref 3.5–5.1)
Potassium: 3.9 mEq/L (ref 3.5–5.1)
Potassium: 4.2 mEq/L (ref 3.5–5.1)
Sodium: 138 mEq/L (ref 135–145)
Sodium: 138 mEq/L (ref 135–145)
Sodium: 139 mEq/L (ref 135–145)
Sodium: 140 mEq/L (ref 135–145)
Sodium: 143 mEq/L (ref 135–145)

## 2010-05-19 LAB — CBC
HCT: 26.8 % — ABNORMAL LOW (ref 39.0–52.0)
HCT: 27.6 % — ABNORMAL LOW (ref 39.0–52.0)
HCT: 28.7 % — ABNORMAL LOW (ref 39.0–52.0)
HCT: 30.2 % — ABNORMAL LOW (ref 39.0–52.0)
HCT: 32.2 % — ABNORMAL LOW (ref 39.0–52.0)
Hemoglobin: 10 g/dL — ABNORMAL LOW (ref 13.0–17.0)
Hemoglobin: 12.1 g/dL — ABNORMAL LOW (ref 13.0–17.0)
Hemoglobin: 8.9 g/dL — ABNORMAL LOW (ref 13.0–17.0)
Hemoglobin: 9 g/dL — ABNORMAL LOW (ref 13.0–17.0)
Hemoglobin: 9.4 g/dL — ABNORMAL LOW (ref 13.0–17.0)
MCH: 33.5 pg (ref 26.0–34.0)
MCH: 33.8 pg (ref 26.0–34.0)
MCH: 34.9 pg — ABNORMAL HIGH (ref 26.0–34.0)
MCH: 35.2 pg — ABNORMAL HIGH (ref 26.0–34.0)
MCHC: 32.7 g/dL (ref 30.0–36.0)
MCHC: 32.8 g/dL (ref 30.0–36.0)
MCHC: 33.1 g/dL (ref 30.0–36.0)
MCHC: 33.3 g/dL (ref 30.0–36.0)
MCHC: 33.9 g/dL (ref 30.0–36.0)
MCV: 102.1 fL — ABNORMAL HIGH (ref 78.0–100.0)
MCV: 103.9 fL — ABNORMAL HIGH (ref 78.0–100.0)
MCV: 104.5 fL — ABNORMAL HIGH (ref 78.0–100.0)
Platelets: 201 10*3/uL (ref 150–400)
Platelets: 222 10*3/uL (ref 150–400)
RBC: 2.6 MIL/uL — ABNORMAL LOW (ref 4.22–5.81)
RBC: 2.81 MIL/uL — ABNORMAL LOW (ref 4.22–5.81)
RBC: 3.47 MIL/uL — ABNORMAL LOW (ref 4.22–5.81)
RDW: 12.9 % (ref 11.5–15.5)
RDW: 13 % (ref 11.5–15.5)
RDW: 13.1 % (ref 11.5–15.5)
RDW: 13.1 % (ref 11.5–15.5)
RDW: 14.1 % (ref 11.5–15.5)
WBC: 11.1 10*3/uL — ABNORMAL HIGH (ref 4.0–10.5)
WBC: 9.1 10*3/uL (ref 4.0–10.5)

## 2010-05-19 LAB — GLUCOSE, CAPILLARY
Glucose-Capillary: 103 mg/dL — ABNORMAL HIGH (ref 70–99)
Glucose-Capillary: 112 mg/dL — ABNORMAL HIGH (ref 70–99)
Glucose-Capillary: 112 mg/dL — ABNORMAL HIGH (ref 70–99)
Glucose-Capillary: 116 mg/dL — ABNORMAL HIGH (ref 70–99)
Glucose-Capillary: 117 mg/dL — ABNORMAL HIGH (ref 70–99)
Glucose-Capillary: 118 mg/dL — ABNORMAL HIGH (ref 70–99)
Glucose-Capillary: 120 mg/dL — ABNORMAL HIGH (ref 70–99)
Glucose-Capillary: 123 mg/dL — ABNORMAL HIGH (ref 70–99)
Glucose-Capillary: 124 mg/dL — ABNORMAL HIGH (ref 70–99)
Glucose-Capillary: 128 mg/dL — ABNORMAL HIGH (ref 70–99)
Glucose-Capillary: 139 mg/dL — ABNORMAL HIGH (ref 70–99)
Glucose-Capillary: 139 mg/dL — ABNORMAL HIGH (ref 70–99)
Glucose-Capillary: 141 mg/dL — ABNORMAL HIGH (ref 70–99)
Glucose-Capillary: 142 mg/dL — ABNORMAL HIGH (ref 70–99)
Glucose-Capillary: 153 mg/dL — ABNORMAL HIGH (ref 70–99)
Glucose-Capillary: 161 mg/dL — ABNORMAL HIGH (ref 70–99)
Glucose-Capillary: 72 mg/dL (ref 70–99)
Glucose-Capillary: 84 mg/dL (ref 70–99)
Glucose-Capillary: 86 mg/dL (ref 70–99)
Glucose-Capillary: 87 mg/dL (ref 70–99)
Glucose-Capillary: 89 mg/dL (ref 70–99)
Glucose-Capillary: 93 mg/dL (ref 70–99)
Glucose-Capillary: 95 mg/dL (ref 70–99)

## 2010-05-19 LAB — BRAIN NATRIURETIC PEPTIDE
Pro B Natriuretic peptide (BNP): 619 pg/mL — ABNORMAL HIGH (ref 0.0–100.0)
Pro B Natriuretic peptide (BNP): 908 pg/mL — ABNORMAL HIGH (ref 0.0–100.0)

## 2010-05-19 LAB — DIFFERENTIAL
Basophils Absolute: 0 10*3/uL (ref 0.0–0.1)
Basophils Relative: 0 % (ref 0–1)
Eosinophils Relative: 2 % (ref 0–5)
Lymphocytes Relative: 15 % (ref 12–46)
Monocytes Absolute: 0.6 10*3/uL (ref 0.1–1.0)

## 2010-05-19 LAB — CLOSTRIDIUM DIFFICILE EIA: C difficile Toxins A+B, EIA: NEGATIVE

## 2010-05-20 LAB — COMPREHENSIVE METABOLIC PANEL
ALT: 9 U/L (ref 0–53)
AST: 10 U/L (ref 0–37)
AST: 13 U/L (ref 0–37)
Albumin: 2.5 g/dL — ABNORMAL LOW (ref 3.5–5.2)
Alkaline Phosphatase: 56 U/L (ref 39–117)
BUN: 37 mg/dL — ABNORMAL HIGH (ref 6–23)
CO2: 21 mEq/L (ref 19–32)
CO2: 22 mEq/L (ref 19–32)
Calcium: 8.1 mg/dL — ABNORMAL LOW (ref 8.4–10.5)
Chloride: 116 mEq/L — ABNORMAL HIGH (ref 96–112)
Creatinine, Ser: 2.38 mg/dL — ABNORMAL HIGH (ref 0.4–1.5)
GFR calc Af Amer: 33 mL/min — ABNORMAL LOW (ref >60)
GFR calc Af Amer: 45 mL/min — ABNORMAL LOW (ref >60)
GFR calc non Af Amer: 27 mL/min — ABNORMAL LOW (ref >60)
GFR calc non Af Amer: 37 mL/min — ABNORMAL LOW (ref >60)
Glucose, Bld: 119 mg/dL — ABNORMAL HIGH (ref 70–99)
Potassium: 4.2 mEq/L (ref 3.5–5.1)
Sodium: 144 mEq/L (ref 135–145)
Total Bilirubin: 0.3 mg/dL (ref 0.3–1.2)
Total Bilirubin: 0.6 mg/dL (ref 0.3–1.2)

## 2010-05-20 LAB — BASIC METABOLIC PANEL
Calcium: 7.9 mg/dL — ABNORMAL LOW (ref 8.4–10.5)
Chloride: 108 mEq/L (ref 96–112)
Creatinine, Ser: 2.57 mg/dL — ABNORMAL HIGH (ref 0.4–1.5)
GFR calc Af Amer: 30 mL/min — ABNORMAL LOW (ref >60)
Sodium: 139 mEq/L (ref 135–145)

## 2010-05-20 LAB — CARDIAC PANEL(CRET KIN+CKTOT+MB+TROPI)
CK, MB: 1.7 ng/mL (ref 0.3–4.0)
Total CK: 34 U/L (ref 7–232)
Total CK: 41 U/L (ref 7–232)

## 2010-05-20 LAB — HEMOCCULT GUIAC POC 1CARD (OFFICE): Fecal Occult Bld: NEGATIVE

## 2010-05-20 LAB — IRON AND TIBC
Iron: 14 ug/dL — ABNORMAL LOW (ref 42–135)
TIBC: 173 ug/dL — ABNORMAL LOW (ref 215–435)

## 2010-05-20 LAB — POCT I-STAT, CHEM 8
Chloride: 112 mEq/L (ref 96–112)
HCT: 36 % — ABNORMAL LOW (ref 39.0–52.0)
Potassium: 4.1 mEq/L (ref 3.5–5.1)

## 2010-05-20 LAB — MRSA PCR SCREENING: MRSA by PCR: NEGATIVE

## 2010-05-20 LAB — RETICULOCYTES
RBC.: 3.53 MIL/uL — ABNORMAL LOW (ref 4.22–5.81)
Retic Count, Absolute: 28.2 10*3/uL (ref 19.0–186.0)
Retic Ct Pct: 0.8 % (ref 0.4–3.1)

## 2010-05-20 LAB — URINE MICROSCOPIC-ADD ON

## 2010-05-20 LAB — LIPID PANEL
LDL Cholesterol: 39 mg/dL (ref 0–99)
Total CHOL/HDL Ratio: 2.4 RATIO
VLDL: 10 mg/dL (ref 0–40)

## 2010-05-20 LAB — HEMOGLOBIN A1C: Mean Plasma Glucose: 114 mg/dL (ref <117)

## 2010-05-20 LAB — C-REACTIVE PROTEIN: CRP: 19.5 mg/dL — ABNORMAL HIGH (ref <0.6)

## 2010-05-20 LAB — DIFFERENTIAL
Lymphocytes Relative: 17 % (ref 12–46)
Lymphs Abs: 1.6 10*3/uL (ref 0.7–4.0)
Neutrophils Relative %: 69 % (ref 43–77)

## 2010-05-20 LAB — URINALYSIS, ROUTINE W REFLEX MICROSCOPIC
Bilirubin Urine: NEGATIVE
Glucose, UA: NEGATIVE mg/dL
Ketones, ur: NEGATIVE mg/dL
Nitrite: NEGATIVE
Protein, ur: 30 mg/dL — AB
pH: 5 (ref 5.0–8.0)

## 2010-05-20 LAB — CBC
Hemoglobin: 11.5 g/dL — ABNORMAL LOW (ref 13.0–17.0)
Hemoglobin: 12.1 g/dL — ABNORMAL LOW (ref 13.0–17.0)
MCH: 34.5 pg — ABNORMAL HIGH (ref 26.0–34.0)
MCHC: 32.8 g/dL (ref 30.0–36.0)
MCV: 101.5 fL — ABNORMAL HIGH (ref 78.0–100.0)
MCV: 105.1 fL — ABNORMAL HIGH (ref 78.0–100.0)
Platelets: 157 10*3/uL (ref 150–400)
Platelets: 163 10*3/uL (ref 150–400)
RBC: 3.25 MIL/uL — ABNORMAL LOW (ref 4.22–5.81)
RBC: 3.35 MIL/uL — ABNORMAL LOW (ref 4.22–5.81)
WBC: 11.3 10*3/uL — ABNORMAL HIGH (ref 4.0–10.5)
WBC: 9 10*3/uL (ref 4.0–10.5)

## 2010-05-20 LAB — PROCALCITONIN: Procalcitonin: <0.1 ng/mL

## 2010-05-20 LAB — POCT CARDIAC MARKERS
Myoglobin, poc: 105 ng/mL (ref 12–200)
Troponin i, poc: <0.05 ng/mL (ref 0.00–0.09)

## 2010-05-20 LAB — GLUCOSE, CAPILLARY
Glucose-Capillary: 114 mg/dL — ABNORMAL HIGH (ref 70–99)
Glucose-Capillary: 119 mg/dL — ABNORMAL HIGH (ref 70–99)
Glucose-Capillary: 133 mg/dL — ABNORMAL HIGH (ref 70–99)
Glucose-Capillary: 142 mg/dL — ABNORMAL HIGH (ref 70–99)
Glucose-Capillary: 161 mg/dL — ABNORMAL HIGH (ref 70–99)
Glucose-Capillary: 77 mg/dL (ref 70–99)

## 2010-05-20 LAB — CULTURE, BLOOD (ROUTINE X 2)
Culture  Setup Time: 201109290354
Culture: NO GROWTH

## 2010-05-20 LAB — LACTIC ACID, PLASMA: Lactic Acid, Venous: 0.7 mmol/L (ref 0.5–2.2)

## 2010-05-20 LAB — URINE CULTURE: Culture  Setup Time: 201109291653

## 2010-05-20 LAB — ROCKY MTN SPOTTED FVR AB, IGG-BLOOD: RMSF IgG: 0.04 IV

## 2010-05-20 LAB — SODIUM, URINE, RANDOM: Sodium, Ur: 66 mEq/L

## 2010-05-20 LAB — CK TOTAL AND CKMB (NOT AT ARMC): Relative Index: INVALID (ref 0.0–2.5)

## 2010-05-20 LAB — SEDIMENTATION RATE: Sed Rate: 66 mm/hr — ABNORMAL HIGH (ref 0–16)

## 2010-05-20 LAB — CORTISOL: Cortisol, Plasma: 13.4 ug/dL

## 2010-05-20 LAB — VITAMIN B12: Vitamin B-12: 1985 pg/mL — ABNORMAL HIGH (ref 211–911)

## 2010-05-20 LAB — ROCKY MTN SPOTTED FVR AB, IGM-BLOOD: RMSF IgM: 0.14 IV (ref 0.00–0.89)

## 2010-06-10 ENCOUNTER — Other Ambulatory Visit: Payer: Self-pay | Admitting: Gastroenterology

## 2010-06-12 LAB — BASIC METABOLIC PANEL
BUN: 11 mg/dL (ref 6–23)
BUN: 12 mg/dL (ref 6–23)
BUN: 12 mg/dL (ref 6–23)
BUN: 10 mg/dL (ref 6–23)
BUN: 15 mg/dL (ref 6–23)
BUN: 16 mg/dL (ref 6–23)
BUN: 17 mg/dL (ref 6–23)
BUN: 19 mg/dL (ref 6–23)
BUN: 26 mg/dL — ABNORMAL HIGH (ref 6–23)
CO2: 30 mEq/L (ref 19–32)
CO2: 33 mEq/L — ABNORMAL HIGH (ref 19–32)
CO2: 23 mEq/L (ref 19–32)
CO2: 25 mEq/L (ref 19–32)
CO2: 25 mEq/L (ref 19–32)
Calcium: 8.4 mg/dL (ref 8.4–10.5)
Calcium: 8.9 mg/dL (ref 8.4–10.5)
Calcium: 9 mg/dL (ref 8.4–10.5)
Calcium: 9 mg/dL (ref 8.4–10.5)
Chloride: 100 mEq/L (ref 96–112)
Chloride: 102 mEq/L (ref 96–112)
Chloride: 102 mEq/L (ref 96–112)
Chloride: 108 mEq/L (ref 96–112)
Chloride: 112 mEq/L (ref 96–112)
Chloride: 112 mEq/L (ref 96–112)
Creatinine, Ser: 1.02 mg/dL (ref 0.4–1.5)
Creatinine, Ser: 1.17 mg/dL (ref 0.4–1.5)
Creatinine, Ser: 0.77 mg/dL (ref 0.4–1.5)
Creatinine, Ser: 0.88 mg/dL (ref 0.4–1.5)
Creatinine, Ser: 0.93 mg/dL (ref 0.4–1.5)
Creatinine, Ser: 0.96 mg/dL (ref 0.4–1.5)
GFR calc Af Amer: >60 mL/min (ref >60)
GFR calc Af Amer: >60 mL/min (ref >60)
GFR calc Af Amer: >60 mL/min (ref >60)
GFR calc non Af Amer: >60 mL/min (ref >60)
GFR calc non Af Amer: >60 mL/min (ref >60)
GFR calc non Af Amer: >60 mL/min (ref >60)
GFR calc non Af Amer: >60 mL/min (ref >60)
GFR calc non Af Amer: >60 mL/min (ref >60)
GFR calc non Af Amer: >60 mL/min (ref >60)
Glucose, Bld: 158 mg/dL — ABNORMAL HIGH (ref 70–99)
Glucose, Bld: 170 mg/dL — ABNORMAL HIGH (ref 70–99)
Glucose, Bld: 190 mg/dL — ABNORMAL HIGH (ref 70–99)
Glucose, Bld: 146 mg/dL — ABNORMAL HIGH (ref 70–99)
Glucose, Bld: 170 mg/dL — ABNORMAL HIGH (ref 70–99)
Glucose, Bld: 170 mg/dL — ABNORMAL HIGH (ref 70–99)
Glucose, Bld: 129 mg/dL — ABNORMAL HIGH (ref 70–99)
Potassium: 2.9 mEq/L — ABNORMAL LOW (ref 3.5–5.1)
Potassium: 3.4 mEq/L — ABNORMAL LOW (ref 3.5–5.1)
Potassium: 3.5 mEq/L (ref 3.5–5.1)
Potassium: 3 mEq/L — ABNORMAL LOW (ref 3.5–5.1)
Potassium: 3.6 mEq/L (ref 3.5–5.1)
Potassium: 4.4 mEq/L (ref 3.5–5.1)
Sodium: 141 mEq/L (ref 135–145)
Sodium: 138 mEq/L (ref 135–145)

## 2010-06-12 LAB — CROSSMATCH

## 2010-06-12 LAB — CBC
HCT: 30.9 % — ABNORMAL LOW (ref 39.0–52.0)
HCT: 30.6 % — ABNORMAL LOW (ref 39.0–52.0)
HCT: 25 % — ABNORMAL LOW (ref 39.0–52.0)
Hemoglobin: 10.2 g/dL — ABNORMAL LOW (ref 13.0–17.0)
Hemoglobin: 10.4 g/dL — ABNORMAL LOW (ref 13.0–17.0)
Hemoglobin: 8 g/dL — ABNORMAL LOW (ref 13.0–17.0)
MCHC: 32.9 g/dL (ref 30.0–36.0)
MCHC: 31.1 g/dL (ref 30.0–36.0)
MCHC: 32.1 g/dL (ref 30.0–36.0)
MCV: 91.3 fL (ref 78.0–100.0)
MCV: 88.6 fL (ref 78.0–100.0)
MCV: 90.4 fL (ref 78.0–100.0)
Platelets: 291 10*3/uL (ref 150–400)
Platelets: 361 10*3/uL (ref 150–400)
Platelets: 409 10*3/uL — ABNORMAL HIGH (ref 150–400)
Platelets: 417 10*3/uL — ABNORMAL HIGH (ref 150–400)
Platelets: 465 10*3/uL — ABNORMAL HIGH (ref 150–400)
Platelets: 537 10*3/uL — ABNORMAL HIGH (ref 150–400)
Platelets: 557 10*3/uL — ABNORMAL HIGH (ref 150–400)
RBC: 3.31 MIL/uL — ABNORMAL LOW (ref 4.22–5.81)
RBC: 3.43 MIL/uL — ABNORMAL LOW (ref 4.22–5.81)
RBC: 2.87 MIL/uL — ABNORMAL LOW (ref 4.22–5.81)
RDW: 22.1 % — ABNORMAL HIGH (ref 11.5–15.5)
RDW: 16.7 % — ABNORMAL HIGH (ref 11.5–15.5)
RDW: 17.6 % — ABNORMAL HIGH (ref 11.5–15.5)
RDW: 16.5 % — ABNORMAL HIGH (ref 11.5–15.5)
RDW: 16.7 % — ABNORMAL HIGH (ref 11.5–15.5)
WBC: 11.1 10*3/uL — ABNORMAL HIGH (ref 4.0–10.5)
WBC: 13.2 10*3/uL — ABNORMAL HIGH (ref 4.0–10.5)
WBC: 14.4 10*3/uL — ABNORMAL HIGH (ref 4.0–10.5)

## 2010-06-12 LAB — DIFFERENTIAL
Eosinophils Absolute: 0 10*3/uL (ref 0.0–0.7)
Eosinophils Absolute: 0.1 10*3/uL (ref 0.0–0.7)
Eosinophils Relative: 0 % (ref 0–5)
Lymphocytes Relative: 5 % — ABNORMAL LOW (ref 12–46)
Lymphocytes Relative: 6 % — ABNORMAL LOW (ref 12–46)
Lymphs Abs: 0.8 10*3/uL (ref 0.7–4.0)
Lymphs Abs: 1 10*3/uL (ref 0.7–4.0)
Monocytes Absolute: 0.9 10*3/uL (ref 0.1–1.0)
Monocytes Relative: 6 % (ref 3–12)
Monocytes Relative: 5 % (ref 3–12)
Neutro Abs: 12.8 10*3/uL — ABNORMAL HIGH (ref 1.7–7.7)
Neutrophils Relative %: 89 % — ABNORMAL HIGH (ref 43–77)

## 2010-06-12 LAB — GLUCOSE, CAPILLARY
Glucose-Capillary: 120 mg/dL — ABNORMAL HIGH (ref 70–99)
Glucose-Capillary: 154 mg/dL — ABNORMAL HIGH (ref 70–99)
Glucose-Capillary: 157 mg/dL — ABNORMAL HIGH (ref 70–99)
Glucose-Capillary: 160 mg/dL — ABNORMAL HIGH (ref 70–99)
Glucose-Capillary: 168 mg/dL — ABNORMAL HIGH (ref 70–99)
Glucose-Capillary: 215 mg/dL — ABNORMAL HIGH (ref 70–99)
Glucose-Capillary: 242 mg/dL — ABNORMAL HIGH (ref 70–99)
Glucose-Capillary: 108 mg/dL — ABNORMAL HIGH (ref 70–99)
Glucose-Capillary: 116 mg/dL — ABNORMAL HIGH (ref 70–99)
Glucose-Capillary: 119 mg/dL — ABNORMAL HIGH (ref 70–99)
Glucose-Capillary: 131 mg/dL — ABNORMAL HIGH (ref 70–99)
Glucose-Capillary: 131 mg/dL — ABNORMAL HIGH (ref 70–99)
Glucose-Capillary: 132 mg/dL — ABNORMAL HIGH (ref 70–99)
Glucose-Capillary: 136 mg/dL — ABNORMAL HIGH (ref 70–99)
Glucose-Capillary: 137 mg/dL — ABNORMAL HIGH (ref 70–99)
Glucose-Capillary: 138 mg/dL — ABNORMAL HIGH (ref 70–99)
Glucose-Capillary: 140 mg/dL — ABNORMAL HIGH (ref 70–99)
Glucose-Capillary: 142 mg/dL — ABNORMAL HIGH (ref 70–99)
Glucose-Capillary: 148 mg/dL — ABNORMAL HIGH (ref 70–99)
Glucose-Capillary: 149 mg/dL — ABNORMAL HIGH (ref 70–99)
Glucose-Capillary: 152 mg/dL — ABNORMAL HIGH (ref 70–99)
Glucose-Capillary: 154 mg/dL — ABNORMAL HIGH (ref 70–99)
Glucose-Capillary: 157 mg/dL — ABNORMAL HIGH (ref 70–99)
Glucose-Capillary: 158 mg/dL — ABNORMAL HIGH (ref 70–99)
Glucose-Capillary: 160 mg/dL — ABNORMAL HIGH (ref 70–99)
Glucose-Capillary: 163 mg/dL — ABNORMAL HIGH (ref 70–99)
Glucose-Capillary: 164 mg/dL — ABNORMAL HIGH (ref 70–99)
Glucose-Capillary: 171 mg/dL — ABNORMAL HIGH (ref 70–99)
Glucose-Capillary: 172 mg/dL — ABNORMAL HIGH (ref 70–99)
Glucose-Capillary: 179 mg/dL — ABNORMAL HIGH (ref 70–99)
Glucose-Capillary: 96 mg/dL (ref 70–99)
Glucose-Capillary: 99 mg/dL (ref 70–99)
Glucose-Capillary: 103 mg/dL — ABNORMAL HIGH (ref 70–99)

## 2010-06-12 LAB — URINALYSIS, ROUTINE W REFLEX MICROSCOPIC
Glucose, UA: NEGATIVE mg/dL
Ketones, ur: NEGATIVE mg/dL
Leukocytes, UA: NEGATIVE
Protein, ur: 30 mg/dL — AB
Urobilinogen, UA: 0.2 mg/dL (ref 0.0–1.0)

## 2010-06-12 LAB — CLOSTRIDIUM DIFFICILE EIA: C difficile Toxins A+B, EIA: NEGATIVE

## 2010-06-12 LAB — URINE MICROSCOPIC-ADD ON

## 2010-06-12 LAB — CULTURE, BLOOD (ROUTINE X 2)
Culture: NO GROWTH
Culture: NO GROWTH
Culture: NO GROWTH

## 2010-06-12 LAB — COMPREHENSIVE METABOLIC PANEL
ALT: 28 U/L (ref 0–53)
AST: 30 U/L (ref 0–37)
Albumin: 2.1 g/dL — ABNORMAL LOW (ref 3.5–5.2)
CO2: 28 mEq/L (ref 19–32)
Calcium: 9.3 mg/dL (ref 8.4–10.5)
Creatinine, Ser: 1.11 mg/dL (ref 0.4–1.5)
GFR calc Af Amer: >60 mL/min (ref >60)
Sodium: 135 mEq/L (ref 135–145)
Total Protein: 7.6 g/dL (ref 6.0–8.3)

## 2010-06-12 LAB — CARDIAC PANEL(CRET KIN+CKTOT+MB+TROPI): Troponin I: 0.07 ng/mL — ABNORMAL HIGH (ref 0.00–0.06)

## 2010-06-12 LAB — IRON AND TIBC
Iron: 14 ug/dL — ABNORMAL LOW (ref 42–135)
Saturation Ratios: 9 % — ABNORMAL LOW (ref 20–55)
UIBC: 144 ug/dL

## 2010-06-12 LAB — POTASSIUM: Potassium: 4.2 mEq/L (ref 3.5–5.1)

## 2010-06-12 LAB — MAGNESIUM: Magnesium: 1.8 mg/dL (ref 1.5–2.5)

## 2010-06-12 LAB — CULTURE, ROUTINE-ABSCESS: Culture: NO GROWTH

## 2010-06-12 LAB — PHOSPHORUS
Phosphorus: 3 mg/dL (ref 2.3–4.6)
Phosphorus: 3.7 mg/dL (ref 2.3–4.6)

## 2010-06-12 LAB — HEMOCCULT GUIAC POC 1CARD (OFFICE): Fecal Occult Bld: NEGATIVE

## 2010-06-12 LAB — VITAMIN B12: Vitamin B-12: 625 pg/mL (ref 211–911)

## 2010-06-12 LAB — HEMOGLOBIN A1C: Hgb A1c MFr Bld: 6.4 % — ABNORMAL HIGH (ref 4.6–6.1)

## 2010-06-12 LAB — RETICULOCYTES
RBC.: 2.77 MIL/uL — ABNORMAL LOW (ref 4.22–5.81)
Retic Count, Absolute: 47.1 10*3/uL (ref 19.0–186.0)

## 2010-06-12 LAB — VANCOMYCIN, TROUGH
Vancomycin Tr: 22.5 ug/mL — ABNORMAL HIGH (ref 10.0–20.0)
Vancomycin Tr: 22.7 ug/mL — ABNORMAL HIGH (ref 10.0–20.0)

## 2010-06-14 LAB — BASIC METABOLIC PANEL
CO2: 29 mEq/L (ref 19–32)
CO2: 29 mEq/L (ref 19–32)
Calcium: 8.3 mg/dL — ABNORMAL LOW (ref 8.4–10.5)
Calcium: 8.4 mg/dL (ref 8.4–10.5)
Chloride: 100 mEq/L (ref 96–112)
Chloride: 102 mEq/L (ref 96–112)
Chloride: 104 mEq/L (ref 96–112)
Creatinine, Ser: 1.1 mg/dL (ref 0.4–1.5)
GFR calc Af Amer: >60 mL/min (ref >60)
GFR calc non Af Amer: >60 mL/min (ref >60)
Glucose, Bld: 104 mg/dL — ABNORMAL HIGH (ref 70–99)
Glucose, Bld: 99 mg/dL (ref 70–99)
Potassium: 4.6 mEq/L (ref 3.5–5.1)
Sodium: 137 mEq/L (ref 135–145)
Sodium: 140 mEq/L (ref 135–145)

## 2010-06-14 LAB — HEPATIC FUNCTION PANEL
Albumin: 1.6 g/dL — ABNORMAL LOW (ref 3.5–5.2)
Alkaline Phosphatase: 76 U/L (ref 39–117)
Indirect Bilirubin: 0.7 mg/dL (ref 0.3–0.9)
Total Protein: 5.9 g/dL — ABNORMAL LOW (ref 6.0–8.3)

## 2010-06-14 LAB — CULTURE, BLOOD (ROUTINE X 2)
Culture: NO GROWTH
Culture: NO GROWTH

## 2010-06-14 LAB — COMPREHENSIVE METABOLIC PANEL
AST: 19 U/L (ref 0–37)
Alkaline Phosphatase: 77 U/L (ref 39–117)
BUN: 10 mg/dL (ref 6–23)
CO2: 32 mEq/L (ref 19–32)
Calcium: 8.5 mg/dL (ref 8.4–10.5)
Creatinine, Ser: 1.11 mg/dL (ref 0.4–1.5)
Creatinine, Ser: 1.34 mg/dL (ref 0.4–1.5)
GFR calc Af Amer: >60 mL/min (ref >60)
GFR calc non Af Amer: 53 mL/min — ABNORMAL LOW (ref >60)
Glucose, Bld: 142 mg/dL — ABNORMAL HIGH (ref 70–99)
Potassium: 3.9 mEq/L (ref 3.5–5.1)
Total Bilirubin: 0.7 mg/dL (ref 0.3–1.2)
Total Protein: 6.5 g/dL (ref 6.0–8.3)

## 2010-06-14 LAB — BRAIN NATRIURETIC PEPTIDE: Pro B Natriuretic peptide (BNP): 842 pg/mL — ABNORMAL HIGH (ref 0.0–100.0)

## 2010-06-14 LAB — CBC
HCT: 24.4 % — ABNORMAL LOW (ref 39.0–52.0)
HCT: 26.1 % — ABNORMAL LOW (ref 39.0–52.0)
Hemoglobin: 8.2 g/dL — ABNORMAL LOW (ref 13.0–17.0)
Hemoglobin: 8.4 g/dL — ABNORMAL LOW (ref 13.0–17.0)
Hemoglobin: 8.9 g/dL — ABNORMAL LOW (ref 13.0–17.0)
MCHC: 34.3 g/dL (ref 30.0–36.0)
MCHC: 34.6 g/dL (ref 30.0–36.0)
MCV: 103.5 fL — ABNORMAL HIGH (ref 78.0–100.0)
MCV: 103.7 fL — ABNORMAL HIGH (ref 78.0–100.0)
MCV: 104.6 fL — ABNORMAL HIGH (ref 78.0–100.0)
Platelets: 263 10*3/uL (ref 150–400)
Platelets: 271 10*3/uL (ref 150–400)
RBC: 2.33 MIL/uL — ABNORMAL LOW (ref 4.22–5.81)
RBC: 2.33 MIL/uL — ABNORMAL LOW (ref 4.22–5.81)
RDW: 13.8 % (ref 11.5–15.5)
RDW: 13.9 % (ref 11.5–15.5)
RDW: 14.1 % (ref 11.5–15.5)
WBC: 7.5 10*3/uL (ref 4.0–10.5)

## 2010-06-14 LAB — GLUCOSE, CAPILLARY
Glucose-Capillary: 100 mg/dL — ABNORMAL HIGH (ref 70–99)
Glucose-Capillary: 124 mg/dL — ABNORMAL HIGH (ref 70–99)
Glucose-Capillary: 125 mg/dL — ABNORMAL HIGH (ref 70–99)
Glucose-Capillary: 134 mg/dL — ABNORMAL HIGH (ref 70–99)
Glucose-Capillary: 136 mg/dL — ABNORMAL HIGH (ref 70–99)
Glucose-Capillary: 141 mg/dL — ABNORMAL HIGH (ref 70–99)
Glucose-Capillary: 149 mg/dL — ABNORMAL HIGH (ref 70–99)
Glucose-Capillary: 150 mg/dL — ABNORMAL HIGH (ref 70–99)
Glucose-Capillary: 153 mg/dL — ABNORMAL HIGH (ref 70–99)
Glucose-Capillary: 169 mg/dL — ABNORMAL HIGH (ref 70–99)
Glucose-Capillary: 216 mg/dL — ABNORMAL HIGH (ref 70–99)
Glucose-Capillary: 55 mg/dL — ABNORMAL LOW (ref 70–99)
Glucose-Capillary: 68 mg/dL — ABNORMAL LOW (ref 70–99)
Glucose-Capillary: 72 mg/dL (ref 70–99)
Glucose-Capillary: 72 mg/dL (ref 70–99)
Glucose-Capillary: 89 mg/dL (ref 70–99)
Glucose-Capillary: 97 mg/dL (ref 70–99)

## 2010-06-14 LAB — BLOOD GAS, ARTERIAL
Acid-Base Excess: 5.8 mmol/L — ABNORMAL HIGH (ref 0.0–2.0)
O2 Content: 3 L/min
pCO2 arterial: 46.8 mmHg — ABNORMAL HIGH (ref 35.0–45.0)
pH, Arterial: 7.425 (ref 7.350–7.450)
pO2, Arterial: 79.6 mmHg — ABNORMAL LOW (ref 80.0–100.0)

## 2010-06-14 LAB — AMMONIA: Ammonia: 17 umol/L (ref 11–35)

## 2010-06-14 LAB — VANCOMYCIN, TROUGH: Vancomycin Tr: 21.9 ug/mL — ABNORMAL HIGH (ref 10.0–20.0)

## 2010-06-15 LAB — DIFFERENTIAL
Basophils Relative: 0 % (ref 0–1)
Basophils Relative: 0 % (ref 0–1)
Eosinophils Relative: 0 % (ref 0–5)
Eosinophils Relative: 0 % (ref 0–5)
Lymphocytes Relative: 2 % — ABNORMAL LOW (ref 12–46)
Lymphs Abs: 0.4 10*3/uL — ABNORMAL LOW (ref 0.7–4.0)
Monocytes Absolute: 0.5 10*3/uL (ref 0.1–1.0)
Monocytes Relative: 3 % (ref 3–12)
Monocytes Relative: 7 % (ref 3–12)
Neutrophils Relative %: 95 % — ABNORMAL HIGH (ref 43–77)
Smear Review: DECREASED

## 2010-06-15 LAB — CBC
HCT: 29.2 % — ABNORMAL LOW (ref 39.0–52.0)
HCT: 31.7 % — ABNORMAL LOW (ref 39.0–52.0)
HCT: 34.9 % — ABNORMAL LOW (ref 39.0–52.0)
HCT: 36.2 % — ABNORMAL LOW (ref 39.0–52.0)
Hemoglobin: 10.1 g/dL — ABNORMAL LOW (ref 13.0–17.0)
Hemoglobin: 10.9 g/dL — ABNORMAL LOW (ref 13.0–17.0)
Hemoglobin: 11 g/dL — ABNORMAL LOW (ref 13.0–17.0)
Hemoglobin: 11.1 g/dL — ABNORMAL LOW (ref 13.0–17.0)
MCHC: 34 g/dL (ref 30.0–36.0)
MCHC: 34.5 g/dL (ref 30.0–36.0)
MCHC: 34.9 g/dL (ref 30.0–36.0)
MCV: 105 fL — ABNORMAL HIGH (ref 78.0–100.0)
MCV: 105.1 fL — ABNORMAL HIGH (ref 78.0–100.0)
MCV: 105.6 fL — ABNORMAL HIGH (ref 78.0–100.0)
MCV: 105.7 fL — ABNORMAL HIGH (ref 78.0–100.0)
Platelets: 134 10*3/uL — ABNORMAL LOW (ref 150–400)
Platelets: 144 10*3/uL — ABNORMAL LOW (ref 150–400)
Platelets: 149 10*3/uL — ABNORMAL LOW (ref 150–400)
Platelets: 151 10*3/uL (ref 150–400)
Platelets: 153 10*3/uL (ref 150–400)
RBC: 2.96 MIL/uL — ABNORMAL LOW (ref 4.22–5.81)
RDW: 13.5 % (ref 11.5–15.5)
RDW: 14.8 % (ref 11.5–15.5)
RDW: 14.8 % (ref 11.5–15.5)
RDW: 15 % (ref 11.5–15.5)
RDW: 15 % (ref 11.5–15.5)
WBC: 11.7 10*3/uL — ABNORMAL HIGH (ref 4.0–10.5)
WBC: 13.7 10*3/uL — ABNORMAL HIGH (ref 4.0–10.5)
WBC: 7.1 10*3/uL (ref 4.0–10.5)

## 2010-06-15 LAB — LIPID PANEL
Cholesterol: 84 mg/dL (ref 0–200)
LDL Cholesterol: 38 mg/dL (ref 0–99)
Triglycerides: 107 mg/dL (ref <150)
VLDL: 21 mg/dL (ref 0–40)

## 2010-06-15 LAB — GLUCOSE, CAPILLARY
Glucose-Capillary: 133 mg/dL — ABNORMAL HIGH (ref 70–99)
Glucose-Capillary: 140 mg/dL — ABNORMAL HIGH (ref 70–99)
Glucose-Capillary: 143 mg/dL — ABNORMAL HIGH (ref 70–99)
Glucose-Capillary: 145 mg/dL — ABNORMAL HIGH (ref 70–99)
Glucose-Capillary: 152 mg/dL — ABNORMAL HIGH (ref 70–99)
Glucose-Capillary: 173 mg/dL — ABNORMAL HIGH (ref 70–99)
Glucose-Capillary: 176 mg/dL — ABNORMAL HIGH (ref 70–99)
Glucose-Capillary: 177 mg/dL — ABNORMAL HIGH (ref 70–99)
Glucose-Capillary: 179 mg/dL — ABNORMAL HIGH (ref 70–99)
Glucose-Capillary: 188 mg/dL — ABNORMAL HIGH (ref 70–99)
Glucose-Capillary: 189 mg/dL — ABNORMAL HIGH (ref 70–99)
Glucose-Capillary: 194 mg/dL — ABNORMAL HIGH (ref 70–99)
Glucose-Capillary: 195 mg/dL — ABNORMAL HIGH (ref 70–99)
Glucose-Capillary: 197 mg/dL — ABNORMAL HIGH (ref 70–99)
Glucose-Capillary: 200 mg/dL — ABNORMAL HIGH (ref 70–99)
Glucose-Capillary: 202 mg/dL — ABNORMAL HIGH (ref 70–99)
Glucose-Capillary: 209 mg/dL — ABNORMAL HIGH (ref 70–99)
Glucose-Capillary: 209 mg/dL — ABNORMAL HIGH (ref 70–99)
Glucose-Capillary: 216 mg/dL — ABNORMAL HIGH (ref 70–99)
Glucose-Capillary: 224 mg/dL — ABNORMAL HIGH (ref 70–99)
Glucose-Capillary: 239 mg/dL — ABNORMAL HIGH (ref 70–99)
Glucose-Capillary: 258 mg/dL — ABNORMAL HIGH (ref 70–99)
Glucose-Capillary: 258 mg/dL — ABNORMAL HIGH (ref 70–99)

## 2010-06-15 LAB — CULTURE, BLOOD (ROUTINE X 2)

## 2010-06-15 LAB — COMPREHENSIVE METABOLIC PANEL
Albumin: 2.6 g/dL — ABNORMAL LOW (ref 3.5–5.2)
Albumin: 3.6 g/dL (ref 3.5–5.2)
BUN: 28 mg/dL — ABNORMAL HIGH (ref 6–23)
BUN: 11 mg/dL (ref 6–23)
Calcium: 8.4 mg/dL (ref 8.4–10.5)
Calcium: 9.2 mg/dL (ref 8.4–10.5)
Creatinine, Ser: 1.4 mg/dL (ref 0.4–1.5)
Creatinine, Ser: 0.96 mg/dL (ref 0.4–1.5)
Potassium: 3.4 mEq/L — ABNORMAL LOW (ref 3.5–5.1)
Total Protein: 6.3 g/dL (ref 6.0–8.3)
Total Protein: 7.1 g/dL (ref 6.0–8.3)

## 2010-06-15 LAB — BASIC METABOLIC PANEL
BUN: 15 mg/dL (ref 6–23)
BUN: 19 mg/dL (ref 6–23)
BUN: 19 mg/dL (ref 6–23)
BUN: 19 mg/dL (ref 6–23)
BUN: 22 mg/dL (ref 6–23)
CO2: 24 mEq/L (ref 19–32)
CO2: 27 mEq/L (ref 19–32)
CO2: 27 mEq/L (ref 19–32)
Calcium: 8.1 mg/dL — ABNORMAL LOW (ref 8.4–10.5)
Calcium: 8.1 mg/dL — ABNORMAL LOW (ref 8.4–10.5)
Calcium: 8.2 mg/dL — ABNORMAL LOW (ref 8.4–10.5)
Calcium: 8.2 mg/dL — ABNORMAL LOW (ref 8.4–10.5)
Chloride: 104 mEq/L (ref 96–112)
Chloride: 106 mEq/L (ref 96–112)
Chloride: 107 mEq/L (ref 96–112)
Chloride: 109 mEq/L (ref 96–112)
Creatinine, Ser: 1.19 mg/dL (ref 0.4–1.5)
Creatinine, Ser: 1.22 mg/dL (ref 0.4–1.5)
GFR calc Af Amer: >60 mL/min (ref >60)
GFR calc Af Amer: >60 mL/min (ref >60)
GFR calc non Af Amer: 53 mL/min — ABNORMAL LOW (ref >60)
GFR calc non Af Amer: 59 mL/min — ABNORMAL LOW (ref >60)
GFR calc non Af Amer: 59 mL/min — ABNORMAL LOW (ref >60)
GFR calc non Af Amer: >60 mL/min (ref >60)
GFR calc non Af Amer: >60 mL/min (ref >60)
Glucose, Bld: 141 mg/dL — ABNORMAL HIGH (ref 70–99)
Glucose, Bld: 157 mg/dL — ABNORMAL HIGH (ref 70–99)
Glucose, Bld: 159 mg/dL — ABNORMAL HIGH (ref 70–99)
Glucose, Bld: 212 mg/dL — ABNORMAL HIGH (ref 70–99)
Glucose, Bld: 223 mg/dL — ABNORMAL HIGH (ref 70–99)
Glucose, Bld: 229 mg/dL — ABNORMAL HIGH (ref 70–99)
Potassium: 3.2 mEq/L — ABNORMAL LOW (ref 3.5–5.1)
Potassium: 3.4 mEq/L — ABNORMAL LOW (ref 3.5–5.1)
Potassium: 3.6 mEq/L (ref 3.5–5.1)
Potassium: 3.6 mEq/L (ref 3.5–5.1)
Potassium: 3.7 mEq/L (ref 3.5–5.1)
Sodium: 137 mEq/L (ref 135–145)
Sodium: 138 mEq/L (ref 135–145)
Sodium: 139 mEq/L (ref 135–145)
Sodium: 140 mEq/L (ref 135–145)
Sodium: 143 mEq/L (ref 135–145)
Sodium: 143 mEq/L (ref 135–145)

## 2010-06-15 LAB — VANCOMYCIN, TROUGH: Vancomycin Tr: 18.8 ug/mL (ref 10.0–20.0)

## 2010-06-15 LAB — CALCIUM: Calcium: 8.2 mg/dL — ABNORMAL LOW (ref 8.4–10.5)

## 2010-06-15 LAB — POCT I-STAT, CHEM 8
Chloride: 99 mEq/L (ref 96–112)
Creatinine, Ser: 1.5 mg/dL (ref 0.4–1.5)
Glucose, Bld: 331 mg/dL — ABNORMAL HIGH (ref 70–99)
HCT: 38 % — ABNORMAL LOW (ref 39.0–52.0)
Potassium: 3.4 mEq/L — ABNORMAL LOW (ref 3.5–5.1)

## 2010-06-15 LAB — APTT
aPTT: 33 seconds (ref 24–37)
aPTT: 33 seconds (ref 24–37)
aPTT: 34 seconds (ref 24–37)

## 2010-06-15 LAB — URINALYSIS, ROUTINE W REFLEX MICROSCOPIC
Bilirubin Urine: NEGATIVE
Nitrite: NEGATIVE
Specific Gravity, Urine: 1.016 (ref 1.005–1.030)
pH: 5 (ref 5.0–8.0)

## 2010-06-15 LAB — MAGNESIUM: Magnesium: 1.8 mg/dL (ref 1.5–2.5)

## 2010-06-15 LAB — PROTIME-INR
INR: 1.2 (ref 0.00–1.49)
Prothrombin Time: 15.3 seconds — ABNORMAL HIGH (ref 11.6–15.2)

## 2010-06-15 LAB — TSH: TSH: 0.714 u[IU]/mL (ref 0.350–4.500)

## 2010-06-15 LAB — HEMOGLOBIN A1C: Mean Plasma Glucose: 114 mg/dL

## 2010-06-15 LAB — URINE MICROSCOPIC-ADD ON

## 2010-07-20 NOTE — Discharge Summary (Signed)
NAMEBRAYLON, [name]          ACCOUNT NO.:  [id number]   MEDICAL RECORD NO.:  [id number]          PATIENT TYPE:  INP   LOCATION:  3728                         FACILITY:  [facility]   PHYSICIAN:  Hind I Elsaid, MD      DATE OF BIRTH:  May 25, 1940   DATE OF ADMISSION:  07/26/2008  DATE OF DISCHARGE:  08/13/2008                               DISCHARGE SUMMARY   DISCHARGE DIAGNOSES:  1. Methicillin resistant staph aureus bacteremia with negative TEE.  2. Status post right below knee amputation secondary to osteomyelitis      and abscess of the right foot.  3. History of coronary artery disease, ejection fraction 45 to 50%.  4. Bilateral pleural effusion.  5. Osteoporosis.  6. Atrial fibrillation.  7. Multifactorial anemia.  8. Diabetes mellitus.  9. Altered mental status resolved.  10.Severe protein calorie malnutrition.  11.Deconditioning.  12.Hypertension.  13.Coronary artery disease status post coronary artery bypass graft.  14.Diabetic foot with Charcot joint.  15.History of degenerative disk disease status post back surgery.  16.Neuropathy.   MEDICATIONS:  1. Amiodarone 100 mg daily.  2. Neurontin 300 mg p.o. b.i.d..  3. Remeron 15 mg p.o. q.h.s.  4. Diovan 160 mg daily.  5. Lasix 40 mg p.o. daily.  6. Metoclopramide 10 mg twice a day before meals.  7. Niaspan 100 mg daily.  8. Omeprazole 20 mg twice a day.  9. Pravastatin 80 mg daily.  10.Promethazine 25 mg three times a day as needed.  11.Droperidol 1 mg daily.  12.__________ 100 mg daily.  13.__________ 2 to 5 mg three times a day as needed for stomach pain.  14.Advair Diskus 250 /50 one puff twice a day.  15.Albuterol MDI 2 puffs inhaler twice a day q.4h. p.r.n.Marland Kitchen  16.Vancomycin 500 mg IV q.12h until August 19, 2008.  17.Cipro 500 mg p.o. b.i.d. for two weeks.  18.Lactobacillus acidophilus one tablet p.o. b.i.d.Marland Kitchen  19.Potassium chloride 10 mEq p.o. daily.  20.Insulin, Lantus 20 units subcutaneous q.h.s.  21.Insulin  NovoLog sensitive scale t.i.d. a.c. and h.s.  22.Aspirin 325 mg p.o. daily.  23.Docusate sodium 100 mg p.o. daily.  24.Ensure 237 p.o. t.i.d.  25.Iron supplement 150 mg p.o. daily.  26.Oxycodone  27.Percocet one to two tablets p.o. q.4-6h. p.r.n.   FOLLOWUP:  The patient needs to followup with Dr. Di Kindle.  Edilia Bo, number [phone] for evaluation of the wound and removal of  staples within next 10 days.   PROCEDURE:  TEE which did not show any evidence of vegetation, trace  mitral regurgitation, mild mitral valve thickening, moderate to marked  atherosclerosis, ejection fraction 45%, general hypokinesia and  bilateral pleural effusion.   HOSPITAL COURSE:  Please review the discharge summary done by Dr. Elliot Cousin and Dr. Veneta Penton Elnour.   1. Regarding Methicillin resistant staph aureus, patient noted to have      on May 23, one of the blood cultures positive for MRSA.  The      patient had PE.  Report was negative for any evidence of      vegetation.  The patient will be discharged with vancomycin to  complete four weeks of antibiotics.  As we mentioned, vancomycin      started on Jul 26, 2008 and to be finished August 19, 2008.  We      recommend also to repeat blood culture after completing the      vancomycin to assure complete resolution of MRSA.  Also we      recommend vancomycin level peak and trough to be followed at the      nursing home facility and vancomycin trough should be checked 30      minutes prior to morning dose and the dose __________at 10 a.m.      Vancomycin dose will be adjusted by SNF pharmacy.  PICC line was      inserted on June 3 in anticipation of ongoing vancomycin treatment      following hospital discharge.  2. 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  Dr. Edilia Bo recommended followup within      10 to 15 days for removal of staples and to check the wound.  Also      he  recommended to be discontinued with Cipro rather than Zosyn.      The patient will be discharged on 2 weeks of Cipro and recommend      Dr. Edilia Bo to evaluate the wound before stopping Cipro.      Lactobacillus also supplement to avoid any side effects of      prolonged antibiotic.  3. Diabetes mellitus.  The patient will continue with insulin Lantus      and NovoLog sliding scale.  4. Multifactorial anemia.  Hemoglobin remained stable around 8.4.  5. Confusion which is completely resolved at this time.  It was felt      this could be secondary to medications, mainly narcotic.  The      patient will be discharged with small dose of pain medication.      Apparently the patient has history of chronic back pain and he is      on chronic pain medication.  6. Paroxysmal atrial fibrillation.  He is not treated with Coumadin      chronically and has remained under good control and he is on      aspirin, not Coumadin.   At this time we felt the patient received enough treatment at hospital  stay and further care could be provided at nursing home facility.      Hind Bosie Helper, MD  Electronically Signed    HIE/MEDQ  D:  08/13/2008  T:  08/13/2008  Job:  [id number]

## 2010-07-20 NOTE — Discharge Summary (Signed)
[name], [name]          ACCOUNT NO.:  [id number]   MEDICAL RECORD NO.:  [id number]          PATIENT TYPE:  INP   LOCATION:  6732                         FACILITY:  [facility]   PHYSICIAN:  Beckey Rutter, MD  DATE OF BIRTH:  Jul 22, 1940   DATE OF ADMISSION:  10/10/2008  DATE OF DISCHARGE:  10/22/2008                               DISCHARGE SUMMARY   Please refer to the previous dictated discharge summation on October 13, 2008, by Dr. __________on August 16 by Dr. Charlestine Massed.  Dictation done as a progress note.   DISCHARGE DIAGNOSIS:  Please refer to the August 16 interim discharge  summary for the discharge diagnosis list.   DISCHARGE MEDICATIONS:  1. Vancomycin 750 mg IV daily until November 22, 2008.  2. Rifampin 300 mg p.o. b.i.d.  3. Amiodarone 200 mg p.o. daily.  4. Aspirin 325 mg p.o. daily.  5. Coreg 12.5 mg p.o. b.i.d.  6. Fentanyl/Duragesic 25 mcg daily to be changed every 3 days.  Plan      is to taper off the Duragesic dose as tolerated to improve his      mentation.  7. FloraQ 1 capsule twice a day.  8. Flovent 1 puff b.i.d. p.r.n.  9. Lasix 20 mg p.o. b.i.d.  10.Neurontin 300 mg p.o. b.i.d.  11.Insulin aspart sliding scale t.i.d. a.c.  12.Iron complex/NuIron 150 mg p.o. b.i.d.  13.Lactose-free nutritional supplementation/Ensure t.i.d.  14.Xopenex 1.25 mg inhaler q.6 h p.r.n.  15.Flagyl 500 mg p.o. t.i.d.  The Flagyl should be continued until      October 26, 2008.  16.Remeron 15 mg p.o. q.h.s.  17.Niacin 500 mg p.o. q.h.s.  18.Benicar 40 mg p.o. n.p.o. daily.  19.Protonix 40 mg p.o. daily.  20.Potassium chloride/K-Dur 20 mEq p.o. daily.  21.Prednisone 5 mg p.o. daily.  22.Crestor 10 mg p.o. daily h.s.   The patient was seen by Cardiology today who recommended no further  cardiac testing/management at this time.   The patient still has waxing and waning disease status.  It is  recommended that the pain medication and psychotropic medication  be  minimized or tapered off to help with the mentation.  The patient's  condition is stable to be released to skilled nursing facility.  The  patient was advised to follow up with the ID clinic if further  recommendation regarding the long-term antibiotic is needed.      Beckey Rutter, MD  Electronically Signed     EME/MEDQ  D:  10/22/2008  T:  10/22/2008  Job:  [id number]

## 2010-07-20 NOTE — Op Note (Signed)
NAMEORDEAN, FOUTS NO.:  [id number]   MEDICAL RECORD NO.:  [id number]          PATIENT TYPE:  INP   LOCATION:  3732                         FACILITY:  [facility]   PHYSICIAN:  Di Kindle. Edilia Bo, M.D.DATE OF BIRTH:  Mar 05, 1941   DATE OF PROCEDURE:  07/31/2008  DATE OF DISCHARGE:                               OPERATIVE REPORT   PREOPERATIVE DIAGNOSIS:  Osteomyelitis of the right foot with abscess of  the right foot.   POSTOPERATIVE DIAGNOSIS:  Osteomyelitis of the right foot with abscess  of the right foot.   PROCEDURE:  Right below-the-knee amputation.   SURGEON:  Di Kindle. Edilia Bo, MD   ASSISTANT:  Jerold Coombe, PA   ANESTHESIA:  General.   INDICATIONS:  This is a 70 year old gentleman who presented with a  nonhealing wound of the right foot.  He was having significant purulent  drainage from the wound which grew MRSA.  He developed sepsis with MRSA  in his blood and he underwent an MRI which showed extensive infection of  the right foot.  There was also evidence of osteomyelitis and given the  extent of the wound and the proximity to the heel, I felt that after the  wound was fully debrided, there really would not be enough foot left for  limb salvage.  Given his sepsis, I felt the safest and most appropriate  course was primary below-the-knee amputation.  I discussed this with the  patient and his daughter and son and they were agreeable to proceed with  primary below-the-knee amputation.   TECHNIQUE:  The patient was taken to the operating room and received a  general anesthetic.  The right lower extremity was prepped and draped in  the usual sterile fashion.  The circumference of the limb was measured  10 cm distal to the tibial tuberosity and two thirds of this distance  was used to mark the anterior incision.  A long posterior flap of equal  length was then marked.  Tourniquet had been placed on the upper thigh.  The leg was  exsanguinated with an Esmarch bandage and the tourniquet  inflated to 300 mmHg.  Under tourniquet control, the incision was  carried down through the skin, subcutaneous tissue with fascia and  muscle to the tibia and fibula which were dissected free  circumferentially.  The periosteum was elevated and the bone divided  proximal to the level of skin division.  The anterior aspect of the  tibia was beveled.  The fibula was divided proximally and the limb  removed.  The arteries and veins were each individually suture ligated  with 2-0 silk ties.  The tourniquet was then released.  Additional  hemostasis was obtained using electrocautery and 2-0 silk ties.  The  edges of the bone were rasped and  then the wound irrigated with copious amounts of saline.  The fascial  layer was then closed with interrupted 2-0 Vicryls.  The skin was closed  with staples.  A sterile dressing was applied.  The patient tolerated  the procedure well and was transferred to the recovery room in  satisfactory condition.  All needle  and sponge counts were correct.      Di Kindle. Edilia Bo, M.D.  Electronically Signed     CSD/MEDQ  D:  07/31/2008  T:  07/31/2008  Job:  [id number]

## 2010-07-20 NOTE — H&P (Signed)
NAME:  [name], [name] NO.:  [id number]   MEDICAL RECORD NO.:  [id number]          PATIENT TYPE:  EMS   LOCATION:  ED                           FACILITY:  [facility]   PHYSICIAN:  Vania Rea, M.D. DATE OF BIRTH:  [birth year]   DATE OF ADMISSION:  10/09/2008  DATE OF DISCHARGE:                              HISTORY & PHYSICAL   PRIMARY CARE PHYSICIAN:  Dr. Jackquline Denmark at [facility] as  well as Dr. Merri Brunette as an outpatient.   NEUROSURGEON:  Dr. Lovell Sheehan.   CHIEF COMPLAINT:  MRSA bacteremia and confusion.   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  At the  facility, the patient has done very poorly because of his longstanding  history of chronic back pain, and about a month ago and was started on  narcotics for his back pain and has been, according to his daughter,  confused and hallucinating since then.  Of note, a CBC was done on June  24th which showed a white count of 8.6 with a hemoglobin of 8.3.  The  patient continued to be confused and have the back pains and do very  poorly.  CBC was repeated on July 29th, which showed a white count of  14.6.  The patient apparently had been having low grade fever.  He was  started on Rocephin.  Blood cultures were ordered, and blood cultures  revealed MRSA bacteremia.  The patient was started on vancomycin today  and sent to the emergency room for further evaluation.   Because of the patient's confusion we are unable to get a history  directly from him, so the history is coming from the nursing home and  his old medical records as well as from his daughter, [name].   His daughter, [name],  notes that he has had chronic back pains and  chronic back surgery with pinning about 2 to 3 years ago.  She notes  also he has a past history of infection in his spine about 3 years ago  and was treated with a 6-week course of intravenous antibiotics.  She  notes that she feels his current confusion is due to his narcotic  medications, and she feels he also has a history of a bad reaction to  Wellbutrin, according to her mother, and she feels he should be taking  Zoloft.   PAST MEDICAL HISTORY:  1. History of MRSA bacteremia in May.  2. 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  3. Coronary artery disease with ejection fraction of 45 to 50%.  4. Peripheral vascular disease.  5. Bilateral pleural effusion.  6. Osteoporosis.  7. Atrial fibrillation.  8. Chronic anemia, multifactorial.  9. Diabetes type 2.  10.Severe history of malnutrition.  11.Deconditioning.  12.Hypertension.  13.History of CABG.  14.History of diabetic foot with Charcot joint.  15.History of degenerative disk disease, status post back surgery.  16.History of neuropathy.   MEDICATIONS:  1. Percocet 5/325 1 tablet every 4 hours p.r.n.  2. Advair 250/50 1 puff twice daily.  3. Albuterol nebulizer 2.5 mg twice daily.  4. Lactobacillus 1 tablet daily.  5. Lantus 20 units at bedtime.  6. NovoLog by sliding scale 3 times daily before meals.  7. Glycolax 17 grams twice daily.  8. Colace 100 mg daily.  9. Senna 2 tablets daily.  10.Ferrex iron 150 mg twice daily.  11.Iron sulfate 325 mg daily.  12.Neurontin 300 mg each morning and 600 mg each evening.  13.Multivitamins with iron daily.  14.Omeprazole 20 mg twice daily.  15.Remeron 15 mg at bedtime.  16.Zoloft 150 mg daily.  17.Wellbutrin 75 mg daily.  18.Niaspan 500 mg at bedtime.  19.Pravastatin 80 mg daily.  20.Amiodarone 100 mg daily.  21.Dilantin 160 mg daily.  22.Lasix 40 mg daily.  23.Potassium 10 mEq  daily.  24.Aspirin 325 mg daily.  25.Prednisone 5 mg daily.  26.Reglan 10 mg twice daily before meals.  27.Rocephin 1 gram IM daily started about 6 days ago.  Last      discontinued yesterday.  28.Vancomycin 1 gram IV daily started this morning,   ALLERGIES:  Reportedly to IVP DYE, IBUPROFEN, and CYMBALTA, according to  his daughter.  He also has bad reaction to [facility].   SOCIAL HISTORY:  He is a former drinker and a tobacco abuser.  There is  no history of illicit drug use.   FAMILY HISTORY:  Significant for father who died at age 69 secondary to  alcohol abuse.  Mother who died in her 36s from a stroke.   REVIEW OF SYSTEMS:  Other than noted above unable to get a complete  review of systems.   PHYSICAL EXAMINATION:  A confused elderly Caucasian gentleman lying in  the stretcher.  VITALS:  Temperature is 100.7, pulse 84, respirations 18, blood pressure  145/88.  He is saturating at 93% on 2 liters.  Pupils are round and equal.  Mucous membranes pale.  Anicteric.  No cervical lymphadenopathy or thyromegaly.  CHEST:  Clear to auscultation bilaterally.  CARDIOVASCULAR:  Regular rhythm.  No murmurs.  ABDOMEN:  Soft and nontender.  EXTREMITIES:  He is status post right BKA.  He has stage II decubitus  ulcer of his right buttock.  CENTRAL NERVOUS SYSTEM:  The patient does not cooperate with the exam.  He does not appear to have any clear focal weakness but unable to assess  reflexes.   LABS:  White count is 16.6, hemoglobin 8.1, MCV 90.7, platelets 557.  He  has 88% neutrophils.  His absolute neutrophil count is 14.7.  CBC is  significant for glucose of 143, BUN of 33, and creatinine 1.1, otherwise  unremarkable.  His albumin is 2.1.  His liver functions otherwise  unremarkable.  Urinalysis:  He has 30 proteins, and the urine is cloudy.  Urinalysis microscopy is unremarkable.   Two-view chest x-ray shows COPD.  No acute pulmonary findings.   MRI of the thoracic spine was  degraded by motion artifact, but the  overall impression was consistent with diskitis and osteomyelitis  involving T4-T5 and T7-T11, and there was a slight impression upon the  cord of the T10-T11 area.  There is a possibility of a dorsal epidural  abscess extending from T5-T7.   ASSESSMENT:  1. 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  2. Methicillin-resistant staphylococcus aureus bacteremia.  3. Chronic confusion, possibly rated related to central nervous system      infection, possibly medication-induced, possibly due to bacteremia.  4. Chronic anemia.  5. Coronary artery disease.  6. Diabetes type 2.  7. History of atrial fibrillation, now in sinus rhythm.  8. Chronic obstructive pulmonary disease, stable.  9. Chronic pain syndrome.  10.Hypertension.   PLAN:  Will admit this gentleman for intravenous antibiotic therapy with  vancomycin at CNS doses.  Will consult neurosurgery for assistance with  management and will hold off on his psychotropic medications, which may  be confirmed attributing to mental status changes.  Will check a BNP and  hydrate him gently since he appears to have a  markedly elevated BUN-to-creatinine ratio.  Will otherwise continue his  home medications except for temporary hold on his Lasix and make  adjustments to his orders as more information becomes available.  Other  plans as per orders.      Vania Rea, M.D.  Electronically Signed     LC/MEDQ  D:  10/09/2008  T:  10/09/2008  Job:  [id number]   cc:   Soyla Murphy. Renne Crigler, M.D.  Fax: [phone]   Barry Dienes. Eloise Harman, M.D.  Fax: [phone]   Cristi Loron, M.D.  Fax: [phone]

## 2010-07-20 NOTE — H&P (Signed)
NAME:  [name], [name] NO.:  [id number]   MEDICAL RECORD NO.:  [id number]          PATIENT TYPE:  INP   LOCATION:  1827                         FACILITY:  [facility]   PHYSICIAN:  Hillery Aldo, M.D.   DATE OF BIRTH:  07-10-1940   DATE OF ADMISSION:  11/08/2006  DATE OF DISCHARGE:                              HISTORY & PHYSICAL   PRIMARY CARE PHYSICIAN:  Dr. Renne Crigler.   CARDIOLOGIST:  Dr. Tresa Endo.   CHIEF COMPLAINT:  Chest pressure, diaphoresis.   HISTORY OF PRESENT ILLNESS:  The patient is a 70 year old male who is  being actively treated for diskitis and possible osteomyelitis of the  lumbar spine with outpatient IV antibiotics.  The patient therefore has  a peripherally inserted central catheter in the right arm.  At  approximately 3 a.m. last night, he awoke with a vague sense of  discomfort accompanied by diaphoresis.  The patient's complaints seem to  center around a discomfort in the right chest area and he felt that it  was related to his PICC line.  The patient called his son, who lives 2  doors over from him, and his son sat with him for a while and when the  patient's symptoms did not resolve, he came to the emergency department  for evaluation.  When asked directly if he is having chest pain, the  patient denies this.  He states he feels better now.  He has had some  longstanding nausea, but no vomiting.  He denies any fever or chills.  He does endorse a decreased appetite with approximate 10- to 12-pound  weight loss over the past week which he thinks may be related to fluid  shifts.  He is occasionally short of breath due to his underlying COPD  and asthma, but denies any changes in his breathing patterns.  He has  not had any changes in his cough or amounts of sputum or quality of  sputum.  He denies any paroxysmal nocturnal dyspnea.  Nevertheless, the  patient has significant cardiac risk factors and therefore we are  admitting him to rule out acute  coronary syndrome.   PAST MEDICAL HISTORY:  1. Coronary artery disease, status post coronary artery bypass      grafting in 2003 with history of MI after back surgery.  2. Gastroesophageal reflux disease with Barrett's esophagus.  3. Peripheral vascular disease.  4. Renal artery stenosis.  5. Chronic renal insufficiency with a baseline creatinine of 1.4.  6. History of diabetes, not currently on any medications, states that      over the past year or 2, he has discontinued medications due to      hypoglycemic episodes.  7. Hypertension.  8. Hyperlipidemia.  9. Chronic obstructive pulmonary disease.  10.Peripheral neuropathy.  11.Degenerative disk disease, status post back surgery, with recent      diagnosis of diskitis/osteomyelitis of the lumbar spine.  12.History of paroxysmal atrial fibrillation, not a Coumadin candidate      due to falls.  13.History of hospitalization for community-acquired pneumonia.  14.History of MRSA foot ulcer.   FAMILY HISTORY:  The patient's mother is  alive at 84 and has had breast  cancer.  The patient's father died at 87 from lung cancer.  He has one  living brother who is 68 and healthy.  He has a sister who died at age  68 from possibly bleeding ulcer, although he cannot confirm this.   SOCIAL HISTORY:  The patient is divorced.  He lives alone.  His son  lives 2 doors down from him and can provide help if needed.  He has a  history of heavy alcohol use, but quit prior to his bypass grafting  surgery in 2003.  He has a heavy tobacco history, up to 2-3 packs daily,  currently 1-1/2 packs per day at a 40 pack-year history.  He is a  retired Personnel officer.   ALLERGIES:  CYMBALTA causes seizures.   MEDICATIONS:  1. Advair 250/50 one inhalation b.i.d.  2. Albuterol metered-dose inhaler two puffs p.r.n.  3. MSIR 15 mg t.i.d.  4. Mucinex 600 mg b.i.d.  5. Ambien 5 to 10 mg nightly.  6. Phenergan 25 mg p.r.n.  7. Amiodarone 200 mg daily.  8. Prilosec  daily.  9. Remeron, dosage unclear.  10.Avapro 150 mg daily.  11.Crestor 10 mg daily.  12.Lasix 20 mg b.i.d.  13.Lyrica 50 mg t.i.d.  14.Lopressor 12.5 mg b.i.d.:  Recently discontinued.   REVIEW OF SYSTEMS:  As noted in the elements of the HPI above.  The  patient also endorses insomnia with difficulty sleeping.  He also has  had constipation, but no melena or hematochezia.   PHYSICAL EXAM:  VITAL SIGNS:  Temperature 96.5, pulse 59, respirations  18, blood pressure 164/75, O2 saturation 98% on 2 liters.  General:  Well-developed, well-nourished male in no acute distress.  HEENT:  Normocephalic, atraumatic.  PERRL.  EOMI.  Oropharynx is clear  with poor dentition.  NECK:  Supple, no thyromegaly, no lymphadenopathy, no jugular venous  distention.  CHEST:  Decreased breath sounds at the bases, but clear.  No wheezes.  HEART:  Regular rate and rhythm.  No murmurs, rubs, or gallops.  ABDOMEN:  Soft, nontender, nondistended with normoactive bowel sounds.  He does have a periumbilical hernia that reduces easily.  EXTREMITIES:  There is asymmetric edema with the right foot being 2+ and  trace in the left.  SKIN:  Warm and dry.  There is a small ulcer to the bottom of his left  foot with no signs of active infection.  NEUROLOGIC:  The patient is alert and oriented x3.  Cranial nerves II-  XII are grossly intact.  Moves all extremities x4 with diminished, but  equal strength.  He does have peripheral neuropathy with altered  sensation in the feet bilaterally.   DATA REVIEW:  A 12-lead EKG shows normal sinus rhythm with first-degree  AV block and prolonged Q-T interval was 533 milliseconds.  There are no  ST- or T-wave abnormalities appreciated.   Chest x-ray:  No acute findings.  There are changes of COPD/emphysema  noted.   LABORATORY DATA:  White blood cell count is 7.7, hemoglobin 15.1,  hematocrit 43.6, platelets 178,000.  Sodium is 139, potassium 3.9,  chloride 105, bicarb 26.1,  BUN 15, creatinine 1.6, glucose 140.  Point-  of-care cardiac markers are negative for 2 sets.  Alcohol level was less  than 5.   ASSESSMENT AND PLAN:  1. Chest pressure with diaphoresis:  The patient has multiple cardiac      risk factors including his age, male sex, history of prior  myocardial infarction, history of hypertension, history of      dyslipidemia, and ongoing tobacco abuse.  He is at high risk for      recurrent events and therefore we are admitting him to the      telemetry unit and monitoring him closely.  We will cycle cardiac      enzymes every 8 hours for 3 sets.  We will check a 2-D      echocardiogram to evaluate his left ventricular function.  If      indicated, we will involve his cardiologist, Dr. Tresa Endo, for further      workup.  We will start him on aspirin therapy, continue his      angiotensin receptor blocker, and given his current hypertension,      we will restart Lopressor at low dose and hold for pulse rate less      than 60.  2. Hypertension:  As noted above, we will resume his Lopressor, which      had been discontinued recently, and monitor his blood pressure and      adjust his medications as needed.  3. Hyperlipidemia:  We will continue the patient's Crestor and check a      fasting lipid panel to ensure he is adequately treated.  4. Chronic obstructive pulmonary disease:  This appears stable; we      will continue his Advair and nebulized bronchodilator therapy.  5. Renal insufficiency:  The patient's creatinine is 1.6, slightly      higher than his known baseline of 1.4.  We will monitor his renal      function closely.  6. History of methicillin-resistant Staphylococcus aureus foot ulcer:      The patient has another ulcer which does not appear to be actively      infected.  There is no drainage.  We will get the wound care      registered nurse to evaluate and make treatment recommendations.  7. History of diabetes:  The patient's blood  glucose currently is 140.      We will monitor his blood glucoses and start him on sliding-scale      insulin if indicated..  We will also check a hemoglobin A1c.  8. Prophylaxis:  We will initiate Protonix for gastrointestinal      prophylaxis and Lovenox for deep venous thrombosis prophylaxis.  9. Diskitis: We will consult Dr. Ninetta Lights to determine his antibiotic      regimen and continue these in-house.  He has completed 5 out of a 6      week planned course of therapy.      Hillery Aldo, M.D.  Electronically Signed     CR/MEDQ  D:  11/08/2006  T:  11/08/2006  Job:  [id number]   cc:   Soyla Murphy. Renne Crigler, M.D.  Nicki Guadalajara, M.D.

## 2010-07-20 NOTE — Group Therapy Note (Signed)
[name], [name]          ACCOUNT NO.:  [id number]   MEDICAL RECORD NO.:  [id number]          PATIENT TYPE:  INP   LOCATION:  6732                         FACILITY:  [facility]   PHYSICIAN:  Charlestine Massed, MDDATE OF BIRTH:  05-Nov-1940                                 PROGRESS NOTE   PRIMARY CARE PHYSICIAN:  Dr. Renne Crigler.   CARDIOLOGIST:  Dr. Nanetta Batty of [facility].   INFECTIOUS DISEASE:  Dr. Clydie Braun.   NEUROSURGERY:  Dr. Phoebe Perch.   DIAGNOSES:  1. 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  2. Methicillin-resistant Staphylococcus aureus bacteremia.  3. Congestive heart failure with ejection fraction 20-25%, down from      40-45% one month ago.  4. Fluid overload as the main reason for ACUTE DECOMPENSATION AND      WORSENING OF congestive heart failure.  5. Hospital acquired pneumonia on antibiotics.  6. Episodes of nonsustained ventricular tachycardia, currently      controlled.  7. Hypertension.  8. Dyslipidemia.  9. History of coronary artery disease.  10.Diabetes mellitus.  11.Altered mental status with delirium and occasional hallucinations,      currently resolving.  12.Deconditioning with weakness.   CURRENT MEDICATIONS:  1. Vancomycin IV piggyback 1 g IV q.24 hours until November 22, 2008.  2. Zosyn 3.375 g IV piggyback IV q.6 hours until November 01, 2008.  3. Rifampin 10 mg p.o. b.i.d. until November 22, 2008.  4. Avelox 400 mg IV daily until November 01, 2008.  5. Amiodarone 20 mg p.o. daily.  6. Aspirin 325 mg p.o. daily.  7. Coreg 12.5 mg p.o. b.i.d.  8. Fentanyl patch 25 mcg q.72 hours.  9. Flora-Q 1 cap p.o. b.i.d.  10.Lasix 20 mg p.o. b.i.d.  11.Gabapentin 10 mg p.o. b.i.d.  12.Niferex 150 mg p.o. b.i.d.  13.Lactose free Ensure 237 mL p.o. t.i.d.  14.Flagyl 500 mg p.o. t.i.d. until October 26, 2008.  15.Remeron 15 mg p.o. nightly  16.Niaspan 20 mg p.o.  nightly.  17.Benicar 40 mg p.o. daily.  18.Protonix 40 mg p.o. daily.  19.Prednisone 5 mg p.o. daily with meals.  20.Crestor 10 mg p.o. daily.  21.Xopenex 1.25 mg by nebulizer q.6 hours p.r.n. wheezing.   HOSPITAL COURSE BY PROBLEM:  Please refer to detailed summary dictated by Dr. Kathryne Hitch on October 13, 2008.   1. In short, [name] is a 70 year old gentleman with      previous history of spinal osteomyelitis with MRSA who was      readmitted with fever and confusion with encephalopathy, and blood      cultures grew MRSA bacteremia.  MRA of spine revealed 2 foci of      osteomyelitis in the thoracic spine with residual abscess.  The      patient was felt to be in need of surgery and there was no evidence      of compression.  The patient did not need any neurosurgical      intervention and he was started on IV vancomycin and rifampin as      per  infectious disease advice.  After the antibiotics were started,      repeat blood cultures were done, which showed that the bacteria was      cleared from the blood.  In view of the osteomyelitis and      bacteremia, the patient will need IV vancomycin for a total of 6      weeks.  Transthoracic echocardiogram was done, which revealed low      ejection fraction of 20%, but there was no evidence of vegetation      on the echocardiogram.  As per infectious disease, the patient will      require 6 weeks of antibiotics that will cover for any endocarditis      even if it is present.  2. The patient developed WORSENING OF THE HEART FAILURE during the      hospital stay.  The patient had previous SYSTOLIC dysfunction WITH      AN EF of 45% and currently is found to be 20-25%.  This could be      attributed to increased volume load as there is a 10 kg weight gain      over this period, and so the patient has been put on diuresis.  He      had episodes of nonspecific ventricular tachycardia, which are also      believed to be due to  heart failure.  Seen by Dr. Nanetta Batty of      [facility] who is his cardiologist and started      on diuresis.  The patient has diuresed extremely well.  He is      feeling a lot better now and he does not have any episodes of      nonsustained ventricular tachycardia.  Telemetry has revealed few      PVC and couplets.  Cardiology is set to continue the medication      diuresis and repeat BNP as well as echocardiogram after several      weeks.  Continue BNP check to see resolution of the heart failure.  3. The patient developed hospital acquired pneumonia during this stay.      He is discharged on Zosyn and also on Avelox.  Will continue Zosyn      and Avelox for 2 weeks.  He will be on vancomycin anyhow for the      osteomyelitis, so triple antibiotic coverage for hospital acquired      pneumonia will be continued and that is for a 2 weeks total.  4. Currently, the patient's level of functioning is very poor as he      gets pain with mobility.  He will need further continued PT/OT at      the skilled nursing facility.  Should also ensure no further      swelling in the spine and there is no cord compression noted.  Also      will follow up with them after that and suggested no neurosurgical      intervention, and the patient is okay for discharge.  5. The patient to have slightly more diuresis and once he is stable,      he will be discharged by the next 2 to 3 days.  He will be advised      to follow up with cardiology and infectious disease as an      outpatient.  6. His clostridium difficile colitis has been diagnosed during the  stay, and he will be on a total of 14 days of p.o. Flagyl.      Currently, he does not have any diarrhea, so I suppose that the      clostridium difficile is treated well and is responding to the      antibiotics.  Antibiotics course will end on August 22.   SUBJECTIVE:  Today, the patient was seen and evaluated and is feeling  better and ate  more today.  No aspiration and good urine output seen.   OBJECTIVE:  VITAL SIGNS:  Temperature 98.3, heart rate 61, respirations 16-20, blood  pressure 146/74, O2 saturation 94% on room air.  GENERAL:  The patient is awake and alert, answers questions well.  Not  in any distress.  HEAD AND NECK:  Pupils are equal and reactive to light.  No bleeding  seen or redness mucosa.  No thrush.  No JVD seen.  CHEST:  Bilateral air entry is good.  Few crackles heard at the bases on  the left side.  There is decreased air entry on the left side  posteriorly.  No wheezing heard.  CARDIAC:  S1, S2 regular.  No murmurs.  ABDOMEN:  Soft, nontender.  Bowel sounds positive.  EXTREMITIES:  Trace pedal edema bilaterally.  No tenderness or swelling  in the calves.  There is a PICC line on the right upper extremity and  the site is clean and no induration noted.  SKIN:  No sacral ulcers.  No skin breakages.  CNS:  Alert and fully oriented.  Does not have any hallucinations.   LABORATORY STUDIES:  BNP is 2723.  BMP sodium 140, potassium 3.8, chloride 107, bicarb 30.  BUN 8, creatinine 1.02, glucose 184.  Calcium 8.4.  Magnesium 1.7.  Phosphorus 3.0.  Blood cultures August 9, no growth so far.  Blood  cultures on August 5, showed methicillin-resistant Staphylococcus aureus  sensitive to vancomycin and sensitive to trimethoprim sulfamethoxazole,  tetracycline, and also resistant to levofloxacin and gentamicin.   ASSESSMENT AND PLAN:  1. Methicillin-resistant Staphylococcus aureus bacteremia.  2. Hospital acquired pneumonia.  3. Clostridium difficile colitis.  4. Coronary artery disease with congestive heart failure with fluid      overload.  5. Electrolyte imbalance currently corrected.  6. Hypertension.  7. Dyslipidemia.  8. Diabetes.  9. Encephalopathy currently resolved.  10.Deconditioning and weakness secondary to hospital stay and pain in      the thoracic spine.  11.Anemia of  chronic disease.   PLAN:  1. ID. The patient will be continued on vancomycin and Avelox, and      metronidazole, and on Zosyn.  Will follow the patient clinically.      TOTAL OF 6 WEEKS OF VANCOMYCIN, 2 WEEKS OF FLAGYL.  2. Cardiac.  Will continue on amiodarone, Coreg, and aspirin.  Also      diuresis with Lasix.  3. Pulmonary.  He will be continued on Flovent and Xopenex.  Stop      Advair with Solu-Medrol component, and will change albuterol in      view of the fact that the patient developed nonsustained      ventricular tachycardia, so no stimulants for now.  4. Pain.  Continue Fentanyl patch and gabapentin.  5. Depression history.  Continue Remeron.  6. Hypertension.  Continue Benicar.  7. Dyslipidemia.  Continue Crestor and Niaspan.  8. Deep vein thrombosis prophylaxis.  Continue Lovenox.   DISPOSITION:  The patient will be ready for discharge in another  1 to 2 days depending  on his cardiac status.  A total of 40 minutes spent today ON THE  DISCHARGE      Charlestine Massed, MD  Electronically Signed     UT/MEDQ  D:  10/20/2008  T:  10/20/2008  Job:  [id number]   cc:   Soyla Murphy. Renne Crigler, M.D.  Fax: [phone]   Nanetta Batty, M.D.  Fax: [phone]   Mick Sell, MD   Clydene Fake, M.D.  Fax: [phone]

## 2010-07-20 NOTE — Assessment & Plan Note (Signed)
OFFICE VISIT   [name], GENTER  DOB:  1940-10-17                                       09/24/2009  PIRJJ#:[id number]   I saw the patient in the office today for continued followup of his  wound on the left lateral malleolus.  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  He had an extensive wound  with MRSA and extensive osteomyelitis.  I saw him back in February of  this year with a wound on the lateral aspect of his left foot which  measured approximately a centimeter in diameter.  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  His creatinine at that time was 1.33.  He felt that  the wound was improving and did not wish to proceed with  revascularization.  He comes in now for a followup visit.   The patient states that the wound has been followed by [facility] and that they feel that the wound is improving at its base and  showing some evidence of healing.  He has not had any significant pain  associated with the wound.  He does have a prosthesis for his right  below the knee amputation and is ambulatory although his activity is  fairly limited.  I do not get any clear-cut history of claudication in  the left calf or history of rest pain.   SOCIAL HISTORY:  He is single.  He has two children.  He smokes one and  a half packs per day of cigarettes.   His past medical history is significant for adult onset diabetes,  hypertension, hypercholesterolemia, coronary artery disease, congestive  heart failure, COPD and chronic kidney disease.   REVIEW OF SYSTEMS:  CARDIOVASCULAR:  He has had some occasional chest  pain and he is being followed by Dr. Tresa Endo.  He is scheduled to see him  on August 2.  He also admits to  dyspnea on exertion.  He has had no  history of stroke or TIAs.  He has had no history of DVT or phlebitis.  PULMONARY:  He does have a history of productive cough, asthma and  wheezing at times.  MUSCULOSKELETAL:  He does have a history of arthritis.   PHYSICAL EXAMINATION:  General:  This is a pleasant 70 year old  gentleman who appears his stated age.  Vital signs:  His blood pressure  is 172/61, heart rate is 65, temperature is 98.3.  HEENT:  Unremarkable.  Lungs:  Are clear bilaterally to auscultation.  Cardiovascular:  I do  not detect any carotid bruits.  He has a regular rate and rhythm.  Right  below the knee amputation which has healed nicely with no open wounds.  On the left side I cannot palpate a femoral pulse.  I cannot palpate  popliteal or pedal pulses on the left.  The wound on the lateral  malleolus on the left leg measures 12 mm in maximum diameter.  There is  no erythema or drainage currently.  There appears to be a dry eschar  over the wound.  Abdomen:  Soft and nontender with normal pitched bowel  sounds.  Neurological:  He has no focal weakness or paresthesias.  Lymphatics:  He has no significant cervical, axillary or inguinal  lymphadenopathy.   I did order and independently interpret his arterial Doppler study today  which shows monophasic Doppler signals in the left, posterior tibial and  anterior tibial positions with an ABI of 52%.  This has not changed  compared to the study in February of this year.  I have also reviewed  his laboratory work.  His creatinine on 09/04/2009 was 1.73 which has  increased from 1.33 back in February.   With respect to his chest pain he is scheduled to see Dr. Tresa Endo later  this month.  He understands that this is a higher priority than his leg.  He is convinced that the wound is improving and that is the report from  the home health nurse although I am certainly concerned that this is a  limb threatening situation on the left  given that he has had this wound  at least since February with evidence of multilevel arterial occlusive  disease on exam.  The wound for now does appear to be stable however.  I  think he would benefit from an arteriogram although the situation is  further complicated by his renal insufficiency.  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  I plan on  seeing him back in 6 weeks.  He does know to call sooner if the wound  progresses or any other issues arise.     Di Kindle. Edilia Bo, M.D.  Electronically Signed   CSD/MEDQ  D:  09/24/2009  T:  09/25/2009  Job:  [id number]   cc:   Nicki Guadalajara, M.D.  Richard F. Caryn Section, M.D.  Soyla Murphy. Renne Crigler, M.D.

## 2010-07-20 NOTE — Group Therapy Note (Signed)
NAME:  [name], [name] NO.:  [id number]   MEDICAL RECORD NO.:  [id number]          PATIENT TYPE:  INP   LOCATION:  3728                         FACILITY:  [facility]   PHYSICIAN:  Elliot Cousin, M.D.    DATE OF BIRTH:  1941-02-11                                 PROGRESS NOTE   ADDENDUM  Please see the previous discharge summary dictated by Dr. Tamsen Roers.  Of  note, the patient has not been discharged yet.  This is an addendum  progress note summary covering the dates from August 06, 2008 through August 09, 2008.   HOSPITAL COURSE AND DIAGNOSES:  1. Methicillin-resistant Staphylococcus aureus bacteremia.  It was      noted that on Jul 27, 2008, one out of two blood cultures became      positive for MRSA.  The patient was started on vancomycin      empirically at the time of the initial hospital assessment on Jul 26, 2008.  I discussed ongoing therapy with Dr. Sampson Goon,      infectious diseases physician.  He recommended vancomycin therapy      for a total of 4 weeks.  He also recommended ordering a 2-D      echocardiogram for evaluation of endocarditis.  Prior to my      curbside consultation with Dr. Sampson Goon, I had already ordered      another set of blood cultures which was obtained on August 06, 2008.      The blood cultures have remained negative thus far.  The 2-D      echocardiogram was performed on August 07, 2008 and read last night by      Dr. Nicki Guadalajara.  The results essentially revealed an ejection      fraction of 40%, diffuse hypokinesis of the left ventricle, a      moderately thickened and mildly calcified aortic valve and mild      tricuspid regurgitation.  However, Dr. Tresa Endo noted that he could      not rule out a vegetation on the aortic valve and recommended      further evaluation with a TEE.  Dr. Tresa Endo was consulted today.  He      will try to scheduled the TEE in the next few days.  Currently, the      patient is afebrile and his white blood cell  count is within normal      limits.  As of today, he has completed 13 days out of 30 days of      vancomycin therapy.  Of note, a PICC line was inserted on August 07, 2008 in anticipation of ongoing vancomycin treatment following      hospital discharge.  2. 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  Vascular surgeon, Dr.      Edilia Bo, continued to follow the patient.  He restarted Zosyn      several days ago.  He examined the right stump this morning and per  his assessment, the incision site was intact and looked good.  He      advised that the staples should come out in approximately 3 weeks.      He plans to arrange a followup with the patient in his office.  In      addition to vancomycin, Dr. Edilia Bo suggested that the patient      could be discharged on Cipro rather than Zosyn.  3. History of coronary artery disease and cardiomyopathy.  As above,      the patient's echocardiogram revealed an ejection fraction of 40%.      His latest chest x-ray on August 07, 2008 revealed interval worsening      of bilateral lower lobe atelectasis or pneumonia.  Clinically, the      patient does not have pneumonia.  However, because of his      persistent recumbent state, the airspace densities in the lower      lobes are secondary to atelectasis.  The patient was encouraged to      use the incentive spirometry.  He is being treated with oxygen      periodically.  His oxygen saturations are ranging between 92% and      94% on 2 liters.  Although his BNP was elevated at 842, the patient      does not appear to be clinically volume overloaded.  He is being      maintained on Lasix, Zocor, Lovaza and Benicar.  4. Paroxysmal atrial fibrillation.  The patient's heart rate has been      well controlled on amiodarone.  He is also receiving antiplatelet      therapy with aspirin.  He is not treated with Coumadin chronically.  5.  Multifactorial anemia.  The patient has anemia secondary to a      combination of chronic disease, blood loss from surgery, and      possibly chronic blood loss from GI sources (he has a history of      gastritis/antritis and colon polyps).  The patient is being treated      with Nu-Iron and Protonix.  His hemoglobin has been ranging from      8.4 to 11.  6. Diabetes mellitus.  The patient's blood glucose has been well      controlled, although there has been a propensity for hypoglycemia      because of his poor intake.  Insulin has been adjusted.      accordingly.  7. Confusion.  The patient has underlying mild dementia.  Over the      past several days, he has been more confused.  Scheduled Oxycontin      was placed on hold.  A number of studies were ordered including an      ABG and blood ammonia.  The ABG on room 3 liters of oxygen revealed      a pH of 7.42, pC02 of 47, and p02 of 80.  His blood ammonia was 17.      The confusion has been likely opiate-induced.  8. Severe protein calorie malnutrion and poor appetite.  For the past      several days, the patient's oral intake has been poor.  He      acknowledged that he does not have an appetite to eat.  He does      have chronic abdominal pain, however, it has not been any worse      that usual. His albumin was  1.8.  The registered dietitian was      consulted and concurred with supplementing with Ensure which had      already been started.  The nursing staff was asked to encourage the      patient to eat and to assist him with his meals.  9. Deconditioning.  Skilled nursing facility placement was recommended      by the therapists.  The patient's daughter agreed with the      recommendation and therefore a bed search has been started.  10.Discharge disposition.  The patient is not ready for hospital      discharge as a TEE is planned, but has not been scheduled yet by      cardiology.  He will need to follow up with Dr. Edilia Bo in  2 to 3      weeks.      Elliot Cousin, M.D.  Electronically Signed     DF/MEDQ  D:  08/09/2008  T:  08/09/2008  Job:  [id number]   cc:   Di Kindle. Edilia Bo, M.D.  [address]  [place]  [place] [address]   Soyla Murphy. Renne Crigler, M.D.  Fax: [phone]   Nanetta Batty, M.D.  Fax: [phone]   Wilber Bihari. Caryn Section, M.D.  Fax: [phone]

## 2010-07-20 NOTE — Assessment & Plan Note (Signed)
OFFICE VISIT   [name], [name]  DOB:  Apr 15, 1940                                       08/19/2008  ZOXWR#:[id number]   I saw the patient in the office today for followup after his recent  right below the knee amputation.  This a pleasant 70 year old gentleman  who had presented with a nonhealing wound of the right foot.  He was  found to have significant purulent drainage which grew MRSA and sepsis.  MRI showed an extensive infection involving the entire foot and it was  not felt that this could be salvaged.  He underwent right below the knee  amputation on 07/31/2008 and did well postoperatively.   On examination the amputation site is healing nicely.  There is no  erythema or drainage.  The stump is warm and well-perfused.  We removed  his staples in the office today and we will have him return in 2 months  for a followup visit.  I have given him a prescription for a prosthesis.   Di Kindle. Edilia Bo, M.D.  Electronically Signed   CSD/MEDQ  D:  08/19/2008  T:  08/20/2008  Job:  [id number]

## 2010-07-20 NOTE — Discharge Summary (Signed)
NAMECHRISTON, [name] NO.:  [id number]   MEDICAL RECORD NO.:  [id number]          PATIENT TYPE:  INP   LOCATION:  2025                         FACILITY:  [facility]   PHYSICIAN:  Nanetta Batty, M.D.   DATE OF BIRTH:  [birth year]   DATE OF ADMISSION:  07/17/2007  DATE OF DISCHARGE:  07/27/2007                               DISCHARGE SUMMARY   DISCHARGE DIAGNOSES:  1. Methicillin-resistant Staphylococcus aureus cellulitis of the lower      extremities, discharged on antibiotics for 4 weeks.  2. Known peripheral vascular disease, peripheral vascular angiogram      this admission, plan is for medical therapy.  3. Coronary artery bypass grafting in 2003, Myoview in May 2009      showing no significant ischemia.  4. Preserved left ventricular function by echocardiogram with      diastolic dysfunction noted in May 2009.  5. Paroxysmal atrial fibrillation, holding sinus rhythm.  6. Treated hypertension.  7. Chronic obstructive pulmonary disease with history of 2-pack-a-day      smoking.  8. Treated dyslipidemia.   HOSPITAL COURSE:  The patient is a 70 year old male followed by Dr.  Allyson Sabal with a history of peripheral vascular disease and coronary  disease.  He presented on Jul 17, 2007, with lower extremity ulcers.  He  was seen in consult by the ID Service.  He was started on antibiotics.  Lower extremity venous Dopplers were negative for DVT.  It was decided  to proceed with peripheral angiogram to see if ischemia was  contributing.  This was done on Jul 23, 2007, by Dr. Allyson Sabal.  The patient  has a total left external iliac artery, common femoral artery, and SFA  with two-vessel runoff.  He has a total SFA on the right and 50%-60%  right external artery with three-vessel runoff.  He has an incidental  finding of a 60% left renal artery narrowing.  Plan is for medical  therapy at this time.  We continued antibiotics, and we feel he can be  discharged on Jul 27, 2007.  Plan  is for a total of 4 weeks of  doxycycline and rifampin.  During his hospitalization, he did have some  transient dyspnea which we felt was related to diastolic heart failure,  this is improved at discharge.  We will discharge him on an increased  dose of Lasix for a couple of days, and then have him seen in the office  by an extender to reevaluate.   LABORATORY DATA:  At discharge, his white count is 8.4, hemoglobin 12.8,  hematocrit 36.6, and platelets 205.  Sodium 141, potassium 3.8, BUN 19,  and creatinine 1.5.  Wound culture done on Jul 17, 2007, shows rare gram-  positive cocci and abundant methicillin-resistant staph.  BNP on Jul 27, 2007, was 15.04 which is coming down.   Chest x-ray showed pulmonary venous congestion and bibasilar  atelectasis, that was on Jul 25, 2007.  Telemetry shows sinus rhythm and  first-degree AV block.   DISCHARGE MEDICATIONS:  1. Multivitamins daily.  2. Calcium citrate twice a day.  3. Omega fish oils 1-2  capsules a day.  4. Lodine daily.  5. Advair 250/50 one puff twice a day as needed.  6. Albuterol 2 puffs q.4-6 h. p.r.n.  7. Amiodarone 100 mg a day.  8. Lasix 40 mg twice a day for 3 days, then 40 mg daily.  9. Lyrica 75 mg three times a day.  10.Metoprolol 12.5 mg twice a day.  11.Avapro has been increased to 150 mg a day.  12.Ambien at bedtime p.r.n.  13.Mucinex twice a day.  14.Niaspan 500 mg at bedtime.  15.Pravastatin 40 mg at bedtime.  16.Hydrocodone p.r.n.  17.Mirtazapine 15 mg at bedtime.   He has been given prescriptions for Vibramycin 100 mg twice a day for 4  weeks and rifampin 600 mg a day for 4 weeks.  We will also sent him home  on Protonix 40 mg a day.   DISPOSITION:  The patient is discharged in stable condition and will see  an extender next week to follow up for heart failure and then see Dr.  Allyson Sabal in 2-3 weeks.      Abelino Derrick, P.A.      Nanetta Batty, M.D.  Electronically Signed    LKK/MEDQ  D:   07/27/2007  T:  07/28/2007  Job:  [id number]   cc:   Nanetta Batty, M.D.

## 2010-07-20 NOTE — Assessment & Plan Note (Signed)
[facility]   NAME:  [name], [name] NO.:  [id number]   MEDICAL RECORD NO.:  [id number]      DATE OF BIRTH:  February 13, 1941   PHYSICIAN:  Leonie Man, M.D.    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  The patient has associated  microvascular occlusive disease.  The patient has been scheduled for HBO  and started his treatments today.  He is continuing to refuse  revascularization of his foot.  Following hyperbaric oxygen, we first  scheduled him for a placement in an easy cast to continue healing in his  foot.   On examination today, the patient's vital signs are stable.  The wound  is clean at its base.  It does continue to probe to bone.   Review of his laboratory findings shows he has a hemoglobin A1c of 5.6  which is quite good.  X-rays of his foot shows a neuropathic foot, but  which are negative for osteomyelitis.   We will go ahead and place him on the hydrogel Promogran dressing  followed by an easy cast and follow up with him in 24 hours to make sure  the cast is properly fitting.  Subsequently, he will be followed up in 1  week.      Leonie Man, M.D.  Electronically Signed     PB/MEDQ  D:  07/22/2008  T:  07/23/2008  Job:  [id number]

## 2010-07-20 NOTE — Op Note (Signed)
NAME:  [name], [name] NO.:  [id number]   MEDICAL RECORD NO.:  [id number]          PATIENT TYPE:  AMB   LOCATION:  ENDO                         FACILITY:  [facility]   PHYSICIAN:  Georgiana Spinner, M.D.    DATE OF BIRTH:  1940/08/04   DATE OF PROCEDURE:  DATE OF DISCHARGE:                               OPERATIVE REPORT   PROCEDURE:  Upper endoscopy with biopsy.   INDICATIONS:  Abdominal pain.   ANESTHESIA:  Fentanyl 50 mcg, Versed 4 mg.   PROCEDURE:  With the patient mildly sedated in the left lateral  decubitus position, the Pentax videoscopic endoscope was inserted in the  mouth, passed under direct vision through the esophagus, which appeared  normal until we reached the distal esophagus.  There were definite  changes of Barrett's esophagus with islands of tissue that was remote  from the squamocolumnar junction.  This was photographed and biopsied.   We then entered into the stomach; fundus and body appeared normal.  The  antrum; however, showed diffuse, deep erythema in the prepyloric area.  This was photographed and multiple biopsies taken.  The duodenal bulb  was also visualized, as was the second portion of the duodenum.   From this point the endoscope was slowly withdrawn, taking  circumferential views of the duodenal mucosa, until the endoscope had  been pulled back and the stomach placed in retroflexion to view the  stomach from below.  The endoscope was straightened and withdrawn,  taking circumferential views of remaining gastric and esophageal mucosa.  The patient's vital signs, pulse oximetry remained stable.  The patient  tolerated the procedure well without apparent complications.   FINDINGS:  Antritis biopsied and Barrett's esophagus biopsied.  Await  biopsy report.  The patient will call me for results and follow-up with  me as an outpatient.  It appears the patient is not on a PPI and should  probably be on one.  Will recommend that to him and  have the patient  call me for results of biopsy and follow-up with me as an outpatient.           ______________________________  Georgiana Spinner, M.D.     GMO/MEDQ  D:  04/23/2008  T:  04/23/2008  Job:  [id number]

## 2010-07-20 NOTE — Assessment & Plan Note (Signed)
OFFICE VISIT   [name], [name]  DOB:  29-Sep-1940                                       04/30/2009  WGNFA#:[id number]   I saw the patient in the office today for continued followup of his  peripheral vascular disease.  This is a pleasant 70 year old gentleman  who I previously performed a right below the knee amputation on for a  nonhealing wound of the right foot.  He had significant drainage from  the right foot which grew MRSA and he presented with sepsis.  He had  extensive osteomyelitis of the foot and the foot was not salvageable.  For this reason he underwent primary below knee amputation.  He presents  now with a wound on the lateral aspect of his left foot over the lateral  malleolus.  He believes that he injured the left ankle approximately 5-6  weeks ago when he injured, he bumped the ankle on his wheelchair.  Of  note, just recently he has begun ambulating with his prosthesis using a  walker.  Before he was pretty much in the wheelchair.  Since the wound  developed he has had some pain associated with this but the pain has not  been significant.  The wound was getting worse at the ankle and then he  was started on doxycycline by Dr. Renne Crigler and the wound has improved  significantly.  His erythema essentially resolved and according to the  daughter the wound has become much smaller.  His activity is fairly  limited so I really do not get any history of claudication.  He has had  no rest pain in the left foot.  He has had no fever or chills.   PAST MEDICAL HISTORY:  Significant for adult onset diabetes.  He does  not require insulin.  In addition, he has hypertension and  hypercholesterolemia both of which are stable on his current  medications.  He had a previous myocardial infarction in 1993.  He also  has a history of congestive heart failure and history of COPD.   He has undergone previous coronary revascularization.   FAMILY HISTORY:   There is no history of premature cardiovascular  disease.   SOCIAL HISTORY:  He is single.  He has two children.  He smokes 2 packs  per day of cigarettes and he has been smoking for as long as he can  remember.   REVIEW OF SYSTEMS:  GENERAL:  He has had no weight loss, weight gain or  problems with his appetite.  CARDIOVASCULAR:  He has had no chest pain, chest pressure, palpitations  or arrhythmias.  He has had no history of stroke, TIAs or amaurosis  fugax.  He has had no history of DVT or phlebitis.  PULMONARY:  He has had no significant productive cough, bronchitis,  asthma or wheezing.  MUSCULOSKELETAL:  He does have a history of  arthritis and joint pain.  PSYCHIATRIC:  He has had depression in the past.  ENT:  He has had some loss of his hearing recently.  GI:  He does have a history of Barrett's esophagus and also a history of  some chronic constipation.  GU, neurologic and hematologic review of systems is unremarkable.   PHYSICAL EXAMINATION:  General:  This is a pleasant 70 year old  gentleman who appears his stated age.  Vital signs:  His blood pressure  is 182/74, heart rate of 67, temperature 97.7.  HEENT:  Unremarkable.  Lungs:  Clear bilaterally to auscultation without rales, rhonchi or  wheezing.  Cardiovascular:  I do not detect any carotid bruits.  He has  a regular rate and rhythm.  He has no significant peripheral edema in  the left leg.  He has a right both below knee amputation which has  healed nicely.  On the right side he has a palpable right femoral pulse.  On the left side I cannot palpate a femoral pulse.  I cannot palpate  popliteal or pedal pulses on the left.  He does have monophasic Doppler  signals in the left foot.  Abdomen:  Soft and nontender with normal  pitched bowel sounds.  No masses appreciated.  Musculoskeletal:  He has  the right below the knee amputation.  There are no other major  deformities or cyanosis.  Neurologic:  He has no focal  weakness or  paresthesias.  Skin:  He does have a 1 cm ulcer over the lateral  malleolus of the left ankle currently with minimal erythema and no  significant drainage.  The wound is fairly dry.  There is no exposed  bone.  Lymphatic:  There is no significant cervical, axillary or  inguinal lymphadenopathy.   He did have an arterial Doppler study in our office today which shows  monophasic Doppler signals in the left posterior tibial position.  A  dorsalis pedis signal could not be obtained.  Ankle brachial index on  the left is 50%.  Of note, he did have a pressure in the digit on the  left of 79 mmHg which is surprisingly good.   I have also reviewed his labs that show a white count of 5.8, hemoglobin  12.8, creatinine is 1.33.   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  For this reason I would have a low threshold to proceed with  arteriography and consider iliac intervention to improve his chance of  healing this wound.  However, the daughter is quite convinced that the  wound has actually made significant progress and is smaller now that he  has been on doxycycline.  The daughter says that Dr. Allyson Sabal has  previously followed his peripheral vascular disease.  I have explained  that if the wound stops making progress or develops any erythema or  drainage then certainly I would have a very low threshold to proceed  with arteriography in the left leg and consider iliac intervention if  indicated to improve his chance of healing this wound.  If the wound  continues to progress then we can simply continue to follow it.  Certainly it is encouraging that the toe pressure is 79 mmHg on the left  which would suggest adequate circulation for healing although perhaps  this is falsely elevated given that he has  evidence of multilevel  arterial occlusive disease on the left.  With respect to his BKA on the  right this is healing nicely.  I plan on seeing him back in 1 year but  would be happy to see him sooner if any new issues arise.  However, as  Dr. Allyson Sabal has followed his peripheral vascular disease prior to this I  think it would certainly be reasonable to have him continue  to follow  his peripheral vascular disease.     Di Kindle. Edilia Bo, M.D.  Electronically Signed   CSD/MEDQ  D:  04/30/2009  T:  05/01/2009  Job:  [id number]   cc:   Soyla Murphy. Renne Crigler, M.D.  Dr Orvan Falconer  Nanetta Batty, M.D.

## 2010-07-20 NOTE — Discharge Summary (Signed)
[name], [name]          ACCOUNT NO.:  [id number]   MEDICAL RECORD NO.:  [id number]          PATIENT TYPE:  INP   LOCATION:  6732                         FACILITY:  [facility]   PHYSICIAN:  Monte Fantasia, MD  DATE OF BIRTH:  [birth year]   DATE OF ADMISSION:  10/10/2008  DATE OF DISCHARGE:                               DISCHARGE SUMMARY   PRIMARY CARE PHYSICIAN:  Unassigned.   INTERIM DIAGNOSES:  1. 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  2. Methicillin-resistant Staphylococcus aureus bacteremia.  3. Anemia of chronic disease.  4. Hypertension.  5. Hyperlipidemia.  6. History of coronary artery disease.  7. Diabetes.  8. Altered mental status, which was as resolved.  9. Delirium secondary to sepsis, which was improved.  10.Deconditioning.  11.Coronary artery disease, status post coronary artery bypass.   MEDICATIONS:  Would be dictated at the time of the final discharge  summary.   PROCEDURES DONE DURING THE STAY IN THE HOSPITAL:  IR-guided aspiration  of thoracic disk under fluoroscopy.   COURSE DURING THE HOSPITAL STAY:  [name], 70 year old Caucasian  male patient, was admitted on October 10, 2008, to [facility], sent in  from the nursing home with increasing confusion.  The patient on July 29  had a white count of 14.6 with low-grade fever, blood cultures grew MRSA  bacteremia.  The patient underwent a TEE for the same and had no  evidence of any.  The patient was started on vancomycin and was sent to  the emergency room for the evaluation.  The patient underwent an MRI on  admission, which showed diskitis and osteomyelitis of T4-T5 and T7-T11  with posterior projection of the disk with slight impression on the cord  prominent at T10-T11 space.  Possibility of dorsal epidural abscess  extending from T5-T7 level was raised, and did not appear to be causing  significant cord compression.  The patient was  transferred to [facility]  for neurosurgical evaluation as per evaluation with Dr. Phoebe Perch.  The  patient did not need any neurosurgical intervention, and IV antibiotics  were continued.  Repeat blood cultures done on admission grew MRSA, and  the patient has been on vancomycin with rifampin for the MRSA since  admission.  ID evaluation was called in for with Dr. Ninetta Lights and as per  Dr. Moshe Cipro recommendations, vancomycin and rifampin were continued.  The patient would need repeat blood cultures today and need for repeat  blood cultures are negative, then would be continued on vancomycin and  rifampin, would need it at least for 6-12 weeks.  Would follow up with  ID recommendations for the same.  Also as per discussions with  neurosurgery, Dr. Phoebe Perch, the patient would benefit from thoracolumbar  corset for support and would need at least repeat MRI in the next 10-14  days as a followup.   The patient had episodes of diarrhea, stools for C. diff was sent in  for, which was positive the first stool cultures and hence, the patient  was started on p.o. vancomycin and Flagyl for the C. diff.  The  patient  has been improving with his stools.  At present, would continue the  treatment for at least 14 days to complete the course.   Hypertension.  The patient's blood pressure has been well controlled  through the stay in the hospital, continued on amlodipine and Benicar.   Delirium.  The patient had come in with altered mental status and  delirium.  This could be secondary to underlying sepsis and bacteremia  and persistent pain, both the pain has been controlled well through the  stay, and the patient's delirium has resolved.   Anemia of chronic disease.  The patient's H and H has had dropped to 7.4  during the stay in the hospital, received 2 units of PRBC transfusion.  At present, the patient's H and H has been 9.7/29.3.  The patient would  continue to monitor.  The patient has also  received IV iron through the  stay in the hospital for his low iron levels and would continue p.o.  iron for his anemia.   For his COPD, the patient is on Advair as well as bronchodilators, and  has been stable through the stay in the hospital.   RADIOLOGICAL INVESTIGATIONS DONE DURING THE STAY IN THE HOSPITAL:  1. Chest x-ray done on October 09, 2008.  Impression:  Chronic lung      changes, chronic obstructive pulmonary disease.  No acute pulmonary      findings.  2. MRI done on October 09, 2008.  Impression:  Findings consistent with      diskitis and osteomyelitis involving T4-T5 and T7-T11 at the disk      space level where posterior projection of the disk and infection      with a slight impression upon the cord, most prominent at T10 and      T11 levels, possibility of dorsal epidural abscess extending from      T5-T7 level is raised.  Presently, this does not appear to be      causing significant cord compression.  The patient may require a      follow up when better able to cooperate.   Consults done with ID with Dr. Ninetta Lights and Neurosurgery with Dr. Phoebe Perch.   LABORATORIES DONE DURING THE STAY IN THE HOSPITAL:  Total WBC of 14.4,  hemoglobin 9.7, hematocrit 29.3, and platelets of 409.  Sodium 142,  potassium 3.9, chloride 112, bicarb 19, glucose 157, BUN 16, creatinine  0.96, calcium of 9, phosphorus 3.7, and magnesium 1.8.  Fecal occult  blood is negative.  Culture of the abscess, thoracic spine, no growth  for 2 days.  Blood cultures x2 done on August 8 have been no growth to  date.  Blood cultures done on August 5, gross MRSA in 1 of the bottles  sensitive to CLINDAMYCIN, GENTAMICIN, LEVOFLOXACIN, PENICILLIN,  RIFAMPIN, BACTRIM, VANCOMYCIN and TETRACYCLINE.  Resistant to OXACILLIN  and ERYTHROMYCIN.   C, diff done on October 10, 2008, was positive.   DISPOSITION:  The patient at present would wait for repeat blood  cultures to be no growth.  If they are still remained to do  not grow any  MRSA, then the patient could be continued on vancomycin and rifampin,  would need that therapy for at least 6-12 weeks.  Would also need an MRI  repeated in next 10-14 days.  The patient at present is  medically stable, to be transferred to a telemetry bed, and would  continue on the neuro checks for now.  At  present, the patient is not  medically stable to be discharged.   TOTAL TIME FOR DICTATION:  Half an hour.      Monte Fantasia, MD  Electronically Signed     MP/MEDQ  D:  10/13/2008  T:  10/13/2008  Job:  [id number]

## 2010-07-20 NOTE — H&P (Signed)
NAME:  [name], [name] NO.:  [id number]   MEDICAL RECORD NO.:  [id number]          PATIENT TYPE:  INP   LOCATION:  3732                         FACILITY:  [facility]   PHYSICIAN:  Manus Gunning, MD      DATE OF BIRTH:  1940/10/19   DATE OF ADMISSION:  07/26/2008  DATE OF DISCHARGE:                              HISTORY & PHYSICAL   CHIEF COMPLAINT:  Abdominal pain with ulcer of right foot.   HISTORY OF PRESENT ILLNESS:  [name] is a pleasant 70 year old  Caucasian male who has a history of chronic abdominal pain.  Of note the  patient is a very poor historian essentially and we have obtained most  of the history from the patient's son over telephone conversation.  The  patient's son's name is [name].  His home phone number is [phone].   [name] was brought to the emergency department because of  abdominal pain.  Of note he has been treated for this abdominal pain for  greater than three years.  A large portion of his treatment has occurred  at [facility] and the fact that the  patient is on Reglan and erythromycin for suspected gastroparesis.  Regardless a CT scan of the abdomen and pelvis was obtained in the  emergency department as an evaluative measure for his abdominal pain and  results demonstrated the patient to have a stable exam with  cholelithiasis and no acute pelvic features.  But unfortunately, the  patient had  a foul smell coming from the base of his right foot which  was in a cast and therefore the ED physician opened the cast up and  removed the boot at the base.  At the sole of his right foot the patient  had a Charcot joint right foot with a diabetic foot ulcer which is  approximately 3 x 3 cm.  It had recently been debrided by Dr. Lurene Shadow on  Jul 22, 2008, and this is when the foot was placed in cast and a boot.  Unfortunately at the time of removal of the boot the patient had foul-  smelling  discharge with seropurulent discharge associated with it.  Note  the patient's white blood cell count is 13,900 though he remains  afebrile at presentation.  He has got a left shift of 95% with bandemia  greater than 20.  The patient himself claims that his foot hurts  severely and it is more swollen and this is corroborated by his son.  Also the patient claims that he is unable to walk on it and this ulcer  has been going on for approximately three months now.  The patient  denies headaches, denies syncope, presyncope, no falls.  Denies chest  pain, palpitations, PND, or orthopnea.  No neck fullness, no  odynophagia, no dysphagia.  No abdominal pain, no nausea or vomiting, no  diarrhea, constipation, dysuria, or polyuria or hematuria.  No bright  red blood per rectum or melenic stool.  Positive abdominal pain  described as a pressure in the left lower quadrant, comes and goes.  No  aggravating or  alleviating factors are appreciated by the patient.  Again note the patient is a very poor historian.  There is no radiation.  There are no associated factors.  The patient denies shortness of  breath, cough, expectoration, or dyspnea on exertion.   Note Dr. 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  But based off of x-rays in the  past this has not been demonstrated.   PAST MEDICAL/SURGICAL HISTORY:  1. Diabetes mellitus, type 2.  2. Hypertension.  3. CAD, status post CABG.  4. Diabetic foot with Charcot joint.  5. Degenerative disk disease, status post back surgery.  6. Peripheral vascular disease with atrial fibrillation.  7. Neuropathy.   SOCIAL HISTORY:  Positive tobacco, one pack per day x35 years.  Drinks  alcohol at  home.  Just recently started drinking and that too is  sporadically and is not routine.  Denies any illicit drug use.   ALLERGIES:  No history of allergies.   FAMILY HISTORY:  Father died at the age of 71 secondary to alcohol  abuse.  Mother died at the age of 47 secondary to a stroke.   HOME MEDICATIONS:  Obtained by the son over the phone are as follows:  1. Amiodarone 100 mg daily.  2. Diovan 160 mg daily.  3. Furosemide 40 mg daily.  4. Metoclopramide10 mg twice daily before meals.  5. Mirtazapine 15 mg at bedtime.  6. Niaspan 1000 mg daily.  7. Omeprazole 20 mg twice a day.  8. Pravastatin 80 mg daily.  9. Promethazine 25 mg three times daily as needed.  10.Ropinirole 1 mg daily.  11.Sertraline 100 mg daily.  12.Ambien 10 mg at bedtime as needed.  13.Erythromycin 225 mg three times daily as needed for stomach pain.  14.Fish oil 1200 mg daily.  15.Advair Diskus 250/50 one puff twice daily.  16.Albuterol metered dose inhaler two puffs twice daily.   REVIEW OF SYSTEMS:  Essentially 14-point review of systems performed.  Pertinent positives and negatives as described above.   PHYSICAL EXAMINATION:  VITAL SIGNS:  At time of presentation are  temperature 98.1, heart rate 76, respiratory rate 16, blood pressure  131/64, O2 saturation 97% on room air.  GENERAL:  Well-nourished, well-developed, elderly gentleman, asleep in  bed, easily arouseable, following all commands, alert, and appropriate,  but very poor historian.  HEENT:  Normocephalic, atraumatic.  Dry oral mucosa.  No thrush,  erythema, or post nasal drip.  Eyes anicteric.  Extraocular muscles are  intact.  Pupils are equal and react to light and accommodation.  NECK:  Supple.  Good range of motion.  No thyromegaly.  No carotid  bruits appreciated.  CARDIOVASCULAR:  S1/S2 normal.  Regular rate and rhythm.  No murmurs,  rubs, or gallops.  LUNGS:  Air entry bilaterally equal.  He has coarse breath sounds.  No  rales,  rhonchi, or wheezes appreciated.  ABDOMEN:  Soft, nontender, nondistended.  Positive bowel sounds.  No  organomegaly.  EXTREMITIES:  No cyanosis, no clubbing.  Dorsalis pedis on the left  lower extremity is weak.  Right side cannot be palpated though the foot  is warm.  There is gross edema on the right foot extending halfway up to  the  ankle and leg associated with possibly a 3 x 3 cm foul-smelling  ulceration on the sole of his right foot.  CENTRAL NERVOUS SYSTEM:  The patient is alert, oriented x3.  Cranial  nerves II-XII grossly intact.  Sensation and reflexes bilaterally  symmetrical.   LABORATORY DATA:  UA demonstrates granular casts 0-2, otherwise  negative.  White blood cell count 13,700, hemoglobin 12.9, hematocrit  36.2, platelet count 134, polymorphs 95%.  Sodium 129, potassium 3.4,  chloride 94, CO2 24, glucose 348, BUN 28, creatinine 1.4.  AST 42, ALT  20, alkaline phosphatase 75, albumin 2.6, calcium 8.4, total protein  6.3.   ASSESSMENT/PLAN:  1. Right Charcot of foot with diabetic foot ulcer.  Start vancomycin 1      g IV q.12 h. and Zosyn 3.375 g IV q.8.  Will consult Dr. Lurene Shadow in      the morning.  Obtain blood cultures x2.  Start fluids at 75 ml an      hour of normal saline.  Repeat a chest x-ray in the morning.  Also      check an EKG.  At this time continue all home medications.  2. For pain control provide morphine 2 mg IV q.6 h. p.r.n. pain.  3. Gastrointestinal and deep vein thrombosis prophylaxis.  Continue      home omeprazole and place on sequential compression devices.      Manus Gunning, MD  Electronically Signed     SP/MEDQ  D:  07/26/2008  T:  07/27/2008  Job:  [id number]

## 2010-07-20 NOTE — Assessment & Plan Note (Signed)
OFFICE VISIT   JAHAZIEL, FRANCOIS  DOB:  [birth year]                                       11/04/2009  [id number]#:[id number]   I saw the patient in the office today for continued follow-up of the  wound on his left lateral malleolus.  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  He had bumped his left ankle on the wheelchair and  developed a small wound.  This is being treated aggressively by Dr.  Lajoyce Corners.  The daughter has shown me some pictures on her camera today and  it does appear that the wound on the lateral malleolus is gradually  improving.  He comes in for routine vascular follow-up visit.  He has  had no significant pain associated with the wound on his left lateral  malleolus.  He has noted a small amount of drainage, but this has been  stable.   REVIEW OF SYSTEMS:  He has had no fever or chills.   PHYSICAL EXAMINATION:  This is a pleasant 70 year old gentleman who  appears his stated age.  Blood pressure is 144/62, heart rate is 52,  saturation 92%.  Lungs are clear bilaterally to auscultation.  Cardiovascular examination; he has a regular rate and rhythm.  He has a  palpable right femoral pulse.  I cannot palpate a left femoral pulse.  Has monophasic posterior tibial, dorsalis pedis, anterior tibial and  peroneal signal on the left.  The wound is about 9 mm x 7 mm in  diameter.  There is some granulation.   According to the daughter the wound has been improving.  He does have  evidence of multilevel arterial occlusive disease on the left.  We have  been reluctant to consider arteriography because of his history of  chronic renal insufficiency.  The wound is improving and so he will  continue his follow-up with Dr. Lajoyce Corners.  If the wound deteriorates the  only other option would be to proceed with CO2  arteriography to see if  there are any options for revascularization to improve his chance of  healing, although he is not an ideal candidate for extensive bypass  given his significant cardiac history with coronary artery disease and  cardiomyopathy.  I plan on seeing him back in 6 weeks unless the wound  degenerates prior to that.  He knows to call sooner if he has problems.     Di Kindle. Edilia Bo, M.D.  Electronically Signed   CSD/MEDQ  D:  11/04/2009  T:  11/05/2009  Job:  [id number]   cc:   Nadara Mustard, MD  Soyla Murphy. Renne Crigler, M.D.

## 2010-07-20 NOTE — Discharge Summary (Signed)
[name], [name] NO.:  [id number]   MEDICAL RECORD NO.:  [id number]          PATIENT TYPE:  INP   LOCATION:  3737                         FACILITY:  [facility]   PHYSICIAN:  Hillery Aldo, M.D.   DATE OF BIRTH:  [birth year]   DATE OF ADMISSION:  11/08/2006  DATE OF DISCHARGE:  11/09/2006                               DISCHARGE SUMMARY   PRIMARY CARE PHYSICIAN:  Dr. Renne Crigler.   CARDIOLOGIST:  Dr. Tresa Endo.   DISCHARGE DIAGNOSES:  1. Noncardiac, vague chest discomfort.  2. History of coronary artery disease.  3. Cipro induced nausea.  4. Gastroesophageal reflux disease with a history of Barrett's      esophagus.  5. Peripheral vascular disease.  6. History of renal artery stenosis.  7. Chronic renal insufficiency.  8. Diet-controlled diabetes.  9. Hypertension.  10.Hyperlipidemia, controlled.  11.Chronic obstructive pulmonary disease.  12.Peripheral neuropathy.  13.Degenerative disk disease.  14.Diskitis/osteomyelitis of the lumbar spine, on week 4 out of 5 of      antibiotic therapy.  15.History of paroxysmal atrial fibrillation, not a Coumadin candidate      due to falls.  16.Healing foot ulcer with scab to the left heel.   DISCHARGE MEDICATIONS:  1. Amiodarone 100 mg daily.  2. Lasix 20 mg daily.  3. Lyrica 75 mg one to three times daily as needed.  4. Avapro 75 mg daily.  5. Symcor 500/20 daily.  6. Requip 1 mg daily.  7. Remeron 15 mg q h.s.  8. Testim 1%, one tube daily.  9. Protonix 40 mg daily.  10.Hydrocodone 500/10, one tablet every 4 hours as needed.  11.Omega III fish oil 1000 mg b.i.d.  12.Multivitamin daily.  13.Calcium citrate one to two tablets daily.  14.Reglan 10 mg q a.c. p.r.n.  15.Ambien 10 mg q h.s. p.r.n.  16.Advair 250/50 one puff b.i.d.  17.Spiriva one puff daily.  18.Albuterol 2 puffs q 4 to 6 hours p.r.n.  19.Gentamicin cream 0.1% every other day on heel.  20.Cipro 500 mg b.i.d.  21.Phenergan 25 mg one-half hour prior to  Cipro dosage.  22.Vancomycin as before at the outpatient infusion center.   CONSULTATIONS:  None.   BRIEF ADMISSION HISTORY OF PRESENT ILLNESS:  The patient is a 70-year-  old male with multiple cardiac risk factors, who presented with some a  complaints of right arm discomfort and right chest discomfort.  He felt  that the pain might be related to his peripherally inserted central  catheter.  Nevertheless, the pain was accompanied by some diaphoresis  and given his cardiac risk factors, he was admitted for 24 hour  observation to rule out acute coronary syndrome.  For the full details  of the HPI, please see my dictated report.   PROCEDURES AND DIAGNOSTIC STUDIES:  Chest X-Ray: On November 08, 2006  showed emphysema without acute disease.  There was left basilar  scarring.   DISCHARGE LABORATORY VALUES:  Cardiac enzymes were negative x 3 sets.  TSH was 0.801.  Cholesterol was 104, triglycerides 89, HDL 27, LDL 59.  Hemoglobin A1c was 5.8.   HOSPITAL COURSE BY PROBLEM:  1. Noncardiac  chest pain:  The patient was admitted to rule out acute      coronary syndrome given his significant risk factor profile.      Cardiac enzymes were checked q 8 hours x 3.  These were completely      negative.  The patient had no recurrence of pain or discomfort of      any type while in the hospital.  He is therefore medically stable      for discharge and should follow up with his primary care physician      and his cardiologist as previously scheduled.  2. Hypertension:  The patient's blood pressure has been well      controlled on his usual outpatient regimen.  3. Hyperlipidemia:  The patient's lipids are well controlled on his      current therapy.  4. Chronic obstructive pulmonary disease:  The patient's respiratory      status is stable.  5. Renal insufficiency:  The patient's creatinine on admission was      1.6, slightly higher than his baseline of 1.4.  He should have a      follow-up renal  function checked at his next physician's office      visit.  6. History of methicillin-resistant Staphylococcus aureus foot ulcer:      The patient was evaluated by the wound care RN who felt that no      additional treatment was needed as the ulcer is well healed with a      scab.  7. History of diabetes:  The patient's hemoglobin A1c was checked and      found to be 5.8%.  He states that he is diet controlled and we      maintained a carbohydrate modified diet while the patient was      hospitalized.  8. diskitis/osteomyelitis:  Dr. Ninetta Lights was contacted regarding the      patient's usual outpatient antibiotic regimen.  Vancomycin was      continued in-house.  Additionally, he was put on IV Cipro.  The      patient notes that Cipro often makes him nauseated and give him      some vague discomfort.  He has one week left of his antibiotics.      He should follow up with Dr. Ninetta Lights and have a repeat MRI scan      done in one week's time.   DISPOSITION:  The patient is medically stable for discharge home.  He  should follow up with Dr. Norma Fredrickson, Dr. Tresa Endo and Dr. Ninetta Lights.      Hillery Aldo, M.D.  Electronically Signed     CR/MEDQ  D:  11/09/2006  T:  11/09/2006  Job:  [id number]   cc:   Nicki Guadalajara, M.D.

## 2010-07-20 NOTE — Discharge Summary (Signed)
NAMECOLLINS, KERBY NO.:  [id number]   MEDICAL RECORD NO.:  [id number]          PATIENT TYPE:  INP   LOCATION:  2025                         FACILITY:  [facility]   PHYSICIAN:  Abelino Derrick, P.A.   DATE OF BIRTH:  1940/12/01   DATE OF ADMISSION:  07/17/2007  DATE OF DISCHARGE:  07/27/2007                               DISCHARGE SUMMARY   ADDENDUM   Mr. Eidem was also on ReQuip 1 mg at night and Zoloft 100 mg at  night.  He has a history of lower extremity neuropathy.  These were  prescribed by Dr. Ala Dach, and he has been on this for some time.  I  resumed these at discharge.      Abelino Derrick, P.ALenard Lance  D:  07/27/2007  T:  07/28/2007  Job:  [id number]

## 2010-07-20 NOTE — Discharge Summary (Signed)
NAME:  [name], [name] NO.:  [id number]   MEDICAL RECORD NO.:  [id number]          PATIENT TYPE:  INP   LOCATION:  3728                         FACILITY:  [facility]   PHYSICIAN:  Beckey Rutter, MD  DATE OF BIRTH:  [birth year]   DATE OF ADMISSION:  07/26/2008  DATE OF DISCHARGE:                               DISCHARGE SUMMARY   HISTORY OF PRESENT ILLNESS:  Mr. Szeto is a pleasant 70 year old  male who presented to the emergency room because of abdominal pain.  The  patient noted to have abdominal pain 3 days prior to the presentation,  and at that time, he received treatment at [facility].  He was started on erythromycin for suspicion of gastroparesis.  In any event, the patient responded well to IV fluid and simple bowel  rest.  It was noticed that the patient has a right-sided boot placed in  [facility], which was oozing.  It was also noticed  that the patient has leukocytosis, 14,000.  The patient was admitted and  started on antibiotic.   IMAGING:  1. CT abdomen without contrast on the day of admission:  Impression:  Stable exam, cholelithiasis.  1. The CT pelvis was showing no acute pelvic feature.  2. Chest x-ray on Jul 27, 2008.  Impression:  Lower lung volume with basilar atelectasis.  Right upper  lobe focal opacity described.  1. Jul 30, 2008, the patient had right foot MRI.  Impression:  Large mid foot abscess replaces much of the Lisfranc joint,  which collapsed and resumption of the middle and lateral cuneiforms.  Resumption and irregularity of much of the navicular and direct abscess  involvement and osteomyelitis of the cuboid.  1. Chest x-ray on Aug 02, 2008.  Impression:  Worsening patchy bilateral airspace.  New small bilateral  pleural effusion.  1. Cardiac catheterization with a history of coronary artery disease.      He had a coronary artery bypass grafting in October 2003.   ANGIOGRAPHIC RESULTS:  1. Renal arteries:  An 80% ostial left inferior accessory pole renal      artery stenosis.      a.     Infrarenal abdominal aorta, moderate and atherosclerotic       changes.  2. 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  3. Right lower extremity, long tubular calcific 50-60% right external      iliac artery stenosis.  There was approximately 20-mm gradient on      the pullback after administration of 200 mcg of intra-arterial      nitroglycerin.   IMPRESSION:  Mr. Beilke has moderate disease in his right external  iliac disease, which does not appear to be obstructive.  I believe he is  a good candidate for femoropopliteal bypass grafting for  revascularization in the setting of critical limb ischemia and  nonhealing ulcer.   HOSPITAL COURSE BY PROBLEM:  The osteomyelitis in the right foot:  First  the patient was considered for femoropopliteal bypass that is secondary  to the osteomyelitis found on the MRI, the patient received a below-knee  amputation.  He was receiving multiple intravenous antibiotic, which was  narrowed and will be actually changed to oral Keflex antibiotic today.  The plan is to continue his physical therapy and inpatient therapy, and  we are awaiting the Vascular Surgery clearance for discharge.   DISCHARGE DIAGNOSES:  1. Right foot osteomyelitis status post below-knee amputation.  2. Diabetes mellitus, type 2.  3. Hyperlipidemia.  4. Coronary artery disease status post coronary artery bypass graft.  5. Degenerative disk disease status post back surgery.  6. Peripheral vascular disease with atrial fibrillation.  7. Neuropathy.   DISCHARGE MEDICATIONS:  Discharge medication will be added to the  discharge summary on the day of actual discharge.      Beckey Rutter, MD  Electronically Signed     EME/MEDQ  D:  08/05/2008  T:  08/06/2008  Job:  [id number]

## 2010-07-20 NOTE — Assessment & Plan Note (Signed)
OFFICE VISIT   NENG, ALBEE  DOB:  March 19, 1940                                       01/06/2010  JYNWG#:[id number]   I saw the patient in the office today for followup after his recent left  below-the-knee amputation which was performed on 12/07/2009.  Overall he  is doing well and has no specific complaints.  He has had no pain  associated with the stump.  He has had no fever or chills.   PHYSICAL EXAMINATION:  His blood pressure is 117/72, heart rate is 59,  temperature is 97.6.  His amputation site has healed nicely.  His  staples had been removed yesterday by the nursing home.  There is no  erythema or drainage.  The stump appears warm and well-perfused.   Overall I am pleased with his progress.  I have written him to start  wearing a stump shrinker on the left.  We will see him back p.r.n.     Di Kindle. Edilia Bo, M.D.  Electronically Signed   CSD/MEDQ  D:  01/06/2010  T:  01/07/2010  Job:  [id number]

## 2010-07-20 NOTE — Consult Note (Signed)
NAME:  GARNETT, NUNZIATA NO.:  [id number]   MEDICAL RECORD NO.:  [id number]          PATIENT TYPE:  INP   LOCATION:  6732                         FACILITY:  [facility]   PHYSICIAN:  Nanetta Batty, M.D.   DATE OF BIRTH:  [birth year]   DATE OF CONSULTATION:  DATE OF DISCHARGE:                                 CONSULTATION   HISTORY OF PRESENT ILLNESS:  Mr. Derego is an unfortunate 70-year-  old male well-known to Dr. Allyson Sabal.  He has a history of coronary disease  and vascular disease.  He has had previous bypass grafting in 2003.  His  last assessment was a Myoview in May 2009, which was low-risk.  Echocardiogram in May 2009 showed preserved LV function.  He was  admitted in May 2010 with a right foot ulcer.  He had previously had a  PV angiogram in December 2009.  He had a PV angiogram Jul 30, 2008.  Please see Dr. Hazle Coca dictated report.  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  Unfortunately, that hospital course was  complicated by MRSA.  He was discharged to [facility] but  readmitted to [facility] early in August with back pain and  mental status changes.  A subsequent evaluation revealed the patient,  unfortunately, had MRSA osteomyelitis involving the thoracic spine.  He  is currently being treated for that.  We are asked to see him in consult  for nonsustained ventricular tachycardia.  He has also had an  echocardiogram, which was read by Dr. Swazil[place] this admission, that shows  new LV dysfunction with an EF of 20-25%.  The patient did have a TEE in  June of this year by Dr. Jacinto Halim, which showed an EF of 45-50% and no  obvious vegetation.  Symptomatically, the patient denies chest pain.   PAST MEDICAL HISTORY:  Remarkable as noted above for coronary artery  bypass grafting in 2003.  He was cathed in July 2004 and had a low-risk  Myoview May 2009.  He has a history of PAF and has been on amiodarone in  the past.  He has COPD and  type 2 diabetes.  He has had previous back  surgery and apparently had previous back infection.   Currently his medications are:  1. Albuterol inhaler.  2. Amiodarone 200 mg a day.  3. Aspirin 325 mg a day.  4. Lovenox as DVT prophylaxis.  5. Flora-Q b.i.d.  6. Advair b.i.d.  7. Neurontin 300 mg b.i.d.  8. Insulin on sliding scale.  9. Iron 150 mg b.i.d.  10.Ensure.  11.Metoprolol 50 mg q.12 h.  12.Flagyl 500 mg t.i.d.  13.Remeron 15 mg h.s.  14.Niacin 500 mg h.s.  15.Benicar 40 mg a day.  16.Protonix 40 mg a day.  17.Prednisone 5 mg a day.  18.Rifampin 300 mg b.i.d.  19.Crestor 10 mg daily.  20.IV vancomycin.  21.Apresoline p.r.n. for blood pressure.   REPORTED ALLERGIES:  CYMBALTA and apparent intolerance to Wellbutrin.   SOCIAL HISTORY:  He is a widower.  He was discharged to [facility] nursing  home after his BKA in June.  He has 2 children.  He quit smoking and  drinking in the past.   FAMILY HISTORY:  Remarkable that his father died in his 59s of alcohol-  related illness.   REVIEW OF SYSTEMS:  Essentially unremarkable except for noted above.   PHYSICAL EXAMINATION:  Blood pressure 170/80, pulse 76, temperature  97.3.  GENERAL:  He is a chronically ill-appearing, lethargic male in no acute  distress.  HEENT:  Normocephalic.  Extraocular movements are intact.  NECK:  Without obvious JVD or bruit.  CHEST:  Reveals diminished breath sounds with poor effort.  CARDIAC:  Reveals diminished heart sounds without obvious murmur, rub or  gallop.  ABDOMEN:  Not tender, not distended.  EXTREMITIES:  Reveal right BKA and chronic vascular insufficiency  changes of the left lower extremity.  NEUROLOGIC  EXAM:  Grossly intact.  He does respond to questions but he  is lethargic.   LABORATORY DATA:  As of October 15, 2008, his potassium was 4.0, BUN 10,  creatinine 0.88 and sodium 139.  His last white count was 10.1 with a  hemoglobin of 10.4, hematocrit 31.6, platelets 417.   Unfortunately, MRI  of his spine yesterday showed persistent diskitis and now abscess.  Telemetry shows a sinus rhythm with runs of nonsustained, irregularly  irregular wide-complex tachycardia.  He also has isolated PVCs.  The  longest run I found in the computer was 6 beats.   IMPRESSION:  1. Nonsustained ventricular tachycardia, asymptomatic.  2. New left ventricular dysfunction with an ejection fraction of 20-      25% by echocardiogram compared to an ejection fraction of 45-50% by      transesophageal echocardiogram in June 2010.  3. 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  4. 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  5. Paroxysmal atrial fibrillation, holding sinus rhythm.  6. Coronary artery bypass grafting in 2003 with low-risk Myoview in      May 2009.  7. Chronic obstructive pulmonary disease.  8. Type II diabetes.  9. Treated dyslipidemia.  10.Hypertension with suboptimal control.   PLAN:  The patient will be seen by Dr. Hazle Coca associate today, Dr.  Mariah Milling.  We will review his medications and make recommendations.  We  may want to recheck his potassium and magnesium today and replete these  as needed.      Abelino Derrick, P.A.      Nanetta Batty, M.D.  Electronically Signed    LKK/MEDQ  D:  10/17/2008  T:  10/17/2008  Job:  [id number]

## 2010-07-20 NOTE — Assessment & Plan Note (Signed)
[facility]   NAME:  [name], [name]          ACCOUNT NO.:  [id number]   MEDICAL RECORD NO.:  [id number]      DATE OF BIRTH:  Mar 29, 1940   PHYSICIAN:  Leonie Man, M.D.    VISIT DATE:  07/14/2008                                   OFFICE VISIT   PROBLEM:  1. Diabetic foot ulcer, right lower extremity on the plantar surface      with clinical evidence of osteomyelitis.  2. 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  3. Charcot foot.  4. History of MRSA, right foot status post vancomycin and doxycycline      and rifampin therapy.   HISTORY:  [name] is a 70 year old diabetic man referred at  courtesy of Dr. Santiago Bumpers of [facility].  The patient who  is a 2-pack per day cigarette smoker with a history of COPD is not  currently experiencing any pain within his foot.  X-rays of the right  foot shows a neuropathic joint disease with pes planus, but without any  definitive mention of osteomyelitis.   Angiogram on March 06, 2008, of the right lower extremity shows a 50-  60% external iliac stenosis with a right-sided superficial femoral  artery occlusion with reconstitution above the knee into a three-vessel  runoff.   CURRENT MEDICATIONS:  1. Diovan 160 mg daily.  2. Lasix 40 mg daily.  3. Metoprolol 12.5 mg daily.  4. Metoclopramide 10 mg daily.  5. Niaspan 1000 mg daily.  6. Omeprazole 20 mg daily.  7. Pravastatin 80 mg daily.  8. Lyrica 1 mg daily.  9. Sertraline 150 mg daily.  10.Omega 3.  11.Multivitamins.  12.Advair 1 puff 2 times daily.  13.Albuterol p.r.n.   ALLERGIES:  WELLBUTRIN and IV CONTRAST.   SURGICAL HISTORY:  1. CABG x3.  2. L4 lumbar surgery.  3. Colonic polypectomy for cancerous polyp.   SOCIAL HISTORY:  Single white male with a multi pack-year history of  cigarette smoking.  He also drinks significantly beer or bourbon on a  daily basis.   REVIEW OF SYSTEMS:   Positive review of systems in the cardiovascular  system showing peripheral vascular disease, hypertension, coronary  artery disease status post myocardial infarction in 2003, less than one  block claudication currently.  ENDOCRINE:  Diabetes 2 with neuropathy.  GASTROINTESTINAL:  Gastroesophageal reflux disease, currently treated.   PHYSICAL EXAMINATION:  VITAL SIGNS:  Temperature 98.5, pulse 96,  respirations 18, blood pressure 149/80.  NECK:  No carotid bruits.  No thyromegaly.  HEENT:  No scleral icterus.  Oropharynx with poor dentition.  CARDIOVASCULAR:  Regular rate and rhythm.  No murmurs, rubs, or gallops.  Carotid upstrokes normal.  No abdominal or femoral bruits are  appreciated.  There are no pulses below the femorals.  EXTREMITIES:  3.5 x 3.6 x 0.2 cm right-sided plantar ulcer.  Peripheral  skin is without any significant callus formation.  The base of the wound  is clean.  The wound does probe down to the bones of the foot.  The  patient has dense peripheral neuropathy and Charcot deformity.  Ankle-  brachial indices are not obtainable on our equipment.   ASSESSMENT:  Charcot foot on the right with diabetic foot ulcer,  Wagner  3 (osteomyelitis).  This is with macrovascular occlusive disease of the  right superficial femoral artery in this 70 year old cigarette smoker.   DISPOSITION AND PLAN:  1. I will go ahead and schedule hyperbaric oxygen therapy for this      patient assuming a normal chest x-ray and an echocardiogram can be      obtained.  2. MRI of the right foot to confirm the presence of osteomyelitis.  3. Offloading with a Darco shoe with a donut around the ulcer.  We      will plan to graduate him up to a total contact cast in the next      few days.  4.  Culture of this wound.  In the interim, an empiric      Augmentin 875 mg b.i.d. will be started.  5.  We will encourage the      patient to stop smoking and to reconsider femoral popliteal bypass       grafting.      Leonie Man, M.D.  Electronically Signed     PB/MEDQ  D:  07/14/2008  T:  07/15/2008  Job:  [id number]   cc:   Fanny Bien. Tuchman, D.P.M.

## 2010-07-20 NOTE — Consult Note (Signed)
[name], VANBLARCOM NO.:  [id number]   MEDICAL RECORD NO.:  [id number]          PATIENT TYPE:  INP   LOCATION:  3732                         FACILITY:  [facility]   PHYSICIAN:  Leonides Grills, M.D.     DATE OF BIRTH:  [birth year]   DATE OF CONSULTATION:  07/28/2008  DATE OF DISCHARGE:                                 CONSULTATION   CHIEF COMPLAINT:  Right foot ulcer.   HISTORY OF PRESENT ILLNESS:  We were asked to see Mr. Buzzell for  possible osteo, right foot.  The patient is a 70 year old male who was  admitted to the ER for abdominal pain and found to have foul-smelling  drainage from his right foot.  The patient reportedly was being treated  for ulceration in the bottom of right foot.  The patient has history of  diabetes mellitus and Charcot right foot.  White blood count on  admission was 13,900.  The patient was afebrile.   ALLERGIES:  No known drug allergies.   MEDICATIONS:  C-chart.   PAST MEDICAL HISTORY:  Diabetes mellitus type 2; hypertension; CAD,  status post CABG; diabetic foot with Charcot joint; peripheral vascular  disease with AFib; neuropathy; degenerative disk disease, status post  back surgery; COPD; history of MRSA right foot, 2008 requiring IV  antibiotics and PICC line.   SOCIAL HISTORY:  1. Positive tobacco use 35 pack years.  2. Positive EtOH.   PHYSICAL EXAMINATION:  VITAL SIGNS:  T-max 99.2, blood pressure 165,  heart rate 125, respiratory rate 20, 96% room air.  EXTREMITIES:  Right foot unable to palpate, pulse right dorsal pedal or  posterior tibial pulse.  He has pitting, although the foot is warm to  touch.  No erythema.  Pitting edema dorsal aspect of foot, he has  decreased sensation throughout the entire foot.  Has an approximately  half-dollar size would, mid plantar aspect of the foot with expressible  purulent drainage.  Margins of wound are good granulating tissue.  Obvious Charcot deformity of the foot.  No red  streaking of leg.   Blood cultures positive for gram positive cocci in clusters x1.   LABORATORY DATA:  Jul 28, 2008, CBC, white count 7100, hemoglobin 10.9,  hematocrit 31.3, platelets 96,000.  Chemistries:  Sodium 140, potassium  3.4, chloride 104, bicarb 27, BUN 26, creatinine 1.26, and glucose 171.   RADIOGRAPHS:  1. Right foot 3 views showed neuropathic foot with good changes to the      tarsometatarsal joint region.  2. Bony prominence plantar aspect of mid foot, no acute osteomyelitis.  3. Angiography results stated March 06, 2008, showed 50-60%      segmental right external iliac artery stenosis.  4. Right superficial femoral artery occlusion.   ASSESSMENT/PLAN:  1. The patient is a 70 year old male with peripheral vascular disease,      diabetes mellitus, Charcot foot noted for foul-smelling drainage      from right foot plantar ulceration.  2. Bacteremia, improved on IV antibiotics.  3. Charcot foot with bony prominence.  4. Peripheral vascular disease.  5. History of MRSA, right foot with previous  IV antibiotics in 2008.  6. Ongoing tobacco use.  7. I spoke with Dr. Lestine Box about the patient and the patient will      have CVTS evaluated.  The patient for his peripheral vascular      disease and right foot ulcer, and also for treatment of these.   Dr. Lestine Box did call the physician on call for [facility] Team D,  Dr. Abram Sander and I did talked with him about being an established patient  of CVTS and recommended that they be consulted for evaluation and  treatment of this right foot ulcer.      Richardean Canal, P.A.      Leonides Grills, M.D.  Electronically Signed    GC/MEDQ  D:  07/28/2008  T:  07/29/2008  Job:  [id number]   cc:   Leonides Grills, M.D.

## 2010-07-20 NOTE — Consult Note (Signed)
VASCULAR SURGERY CONSULTATION   [name], [name]  DOB:  03-09-1940                                       10/16/2007  CHART#:[id number]   I saw the patient in the office today concerning a nonhealing wound in  the right leg.  He was referred by Dr. Allyson Sabal.  This is a pleasant 70-  year-old gentleman whom I had actually seen back in July 2003 with  stable bilateral lower extremity claudication.  He was then lost to  followup.  He developed a wound on the lateral aspect of his right leg  approximately 8 to 10 weeks ago and this has not healed.  He does not  remember any specific injury to the leg.  He states that the wound came  on spontaneously.  He also had some infection in the toes on the right  foot and it was growing MRSA.  He is followed by a podiatrist.  He does  have stable bilateral calf claudication at 3 to 4 blocks.  He denies any  significant thigh or hip claudication.  He denies any history of rest  pain in his feet.  Given the nonhealing wounds, he did undergo an  arteriogram by Dr. Allyson Sabal in May and this showed a left external iliac  artery occlusion.  On the right side he has some mild iliac artery  occlusive disease and an occluded superficial femoral artery at its  origin with reconstitution of the below knee popliteal artery.  He was  referred for consideration for possible fem-pop bypass grafting.   PAST MEDICAL HISTORY:  Significant for adult onset diabetes.  He does  not require insulin.  In addition, he had a previous myocardial  infarction in 2003 and underwent coronary revascularization by Dr.  Laneta Simmers in 2003, vein was taken from the left leg.  He denies any history  of congestive heart failure.  He does have a history of COPD.  He denies  any history of hypertension or hypercholesterolemia.   FAMILY HISTORY:  He is unaware of any history of premature  cardiovascular disease.   SOCIAL HISTORY:  He is single.  He has 2 children.  He  smokes two packs  per day of cigarettes and has been smoking since he was 70 years old.   REVIEW OF SYSTEMS AND MEDICATIONS:  Documented on the medical history  form in his chart.   PHYSICAL EXAMINATION:  This is a pleasant 70 year old gentleman who  appears his stated age.  Blood pressure is 128/68, heart rate is 64.  I  do not detect any carotid bruits.  Lungs are clear bilaterally to  auscultation.  Cardiac exam, he has a regular rate and rhythm.  Abdomen:  Soft and nontender.  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  He has a palpable right femoral pulse with no femoral pulse on the left.  I cannot palpate pedal pulses although he does have monophasic Doppler  signals in both feet.  He has a punctate wound in the lateral aspect of  his right leg which measures approximately 1 x 1.2 cm in diameter.  He  has some dry eschar over the right second toe with currently no  drainage.   I have reviewed his arteriogram which shows a long segment superficial  femoral artery occlusion on the right with a reconstitution of  below  knee popliteal artery and what looks like 3-vessel runoff below that  with some mild diffuse tibial disease.  He does have some disease in the  external iliac artery on the right but has a reasonable femoral pulse in  the right.   Given his nonhealing wounds in the right leg, I think the best and  safest approach for revascularization would be fem-pop bypass grafting  with vein.  Although he does have some proximal iliac disease on the  right, he has reasonable femoral pulse and when he was studied back in  May, this was not felt to be significant.  Given that he has an external  iliac artery occlusion on the left and some iliac disease on the right,  consideration could be given to aortofemoral bypass grafting.  However,  I would be extremely reluctant to consider this given the problems he  has had MRSA in the foot.  For this reason, again, I think  despite his  mild iliac disease on the right, the safest approach would be fem-pop  bypass grafting with vein hopefully to help assist in the healing of  these wounds.  We have also discussed the importance of tobacco  cessation.  He would like to think about this further before scheduling  surgery.  Hopefully he will call and arrange for a left fem-pop bypass  grafting.  We have discussed the procedure and potential complications  in the office today including but not limited to bleeding, wound healing  problems, graft thrombosis and limb loss.  All of his questions were  answered.   Di Kindle. Edilia Bo, M.D.  Electronically Signed  CSD/MEDQ  D:  10/16/2007  T:  10/17/2007  Job:  [id number]   cc:   Nanetta Batty, M.D.  Soyla Murphy. Renne Crigler, M.D.

## 2010-07-20 NOTE — Cardiovascular Report (Signed)
NAME:  [name], LOFTUS NO.:  [id number]   MEDICAL RECORD NO.:  [id number]          PATIENT TYPE:  INP   LOCATION:  3732                         FACILITY:  [facility]   PHYSICIAN:  Nanetta Batty, M.D.   DATE OF BIRTH:  1940-07-21   DATE OF PROCEDURE:  DATE OF DISCHARGE:                            CARDIAC CATHETERIZATION   [name] is a 70 year old gentleman with history of CAD and PVOD.  He had coronary artery bypass grafting in October 2003.  His other  problems include severe PVOD with critical limb ischemia, nonhealing  ulcer on his right foot.  I performed an angiogram on him last year  revealing an occluded left external iliac and SFAs bilaterally.  He was  referred for fem-pop bypass grafting, which he refused at that time.  He  was admitted on Jul 26, 2008, with a nonhealing ulcer and foot pain.  The ulcer grew MRSA and the patient has been bacteremic as well on  vancomycin and Zosyn.  Dr. Edilia Bo was consulted and felt that the  patient required fem-pop bypass grafting for limb salvage and healing.  Because of the duration time since his last angiogram and the fact that  he had moderate right external iliac artery stenosis, he was referred  for angiography to define his anatomy and physiology.   DESCRIPTION OF PROCEDURE:  The patient was brought to the second floor  [facility] in the postabsorptive state.  Premedicated with p.o. Valium.  His right groin was prepped and shaved  in the usual sterile fashion.  Xylocaine 1% was used for local  anesthesia.  A 5-French sheath was inserted into the right femoral  artery using standard Seldinger technique.  A 5-French tennis racket  catheter was used for midstream and distal abdominal aortography with  bifemoral runoff.  Visipaque dye was used for the entirety of the case.  Retrograde aortic pressures were monitored during the case.   ANGIOGRAPHIC RESULTS:  1. Abdominal aorta.      a.      Renal arteries - 80% ostial left inferior accessory pole       renal artery stenosis.      b.     Infrarenal abdominal aorta, moderate atherosclerotic       changes.  2. Left lower extremity.      a.     Occluded left external iliac artery, occluded left SFA with       two-vessel runoff.  3. Right lower extremity.      a.     Long tubular calcific 50-60% right external iliac artery       stenosis.  There was approximately 20-mm gradient on pullback       after administration of 200 mcg of intra-arterial nitroglycerin       via side-arm sheath.      b.     Total SFA with reconstitution in Hunter's canal.  There is       three-vessel runoff.   IMPRESSION:  Mr. Safley has moderate disease in his right external  iliac arteries, which does not appear to be obstructive, total right  superficial femoral artery with three-vessel runoff.  I believe he is a  good candidate for fem-pop bypass grafting for revascularization in the  setting of critical limb ischemia and nonhealing ulcer.  I believe  that the external iliac artery disease on the right will not compromise  graft flow.  An ACT was measured and the sheath was removed.  Pressures  were held at the groin to achieve hemostasis.  The patient left the lab  in stable condition.      Nanetta Batty, M.D.  Electronically Signed     JB/MEDQ  D:  07/30/2008  T:  07/31/2008  Job:  [id number]   cc:   [facility]  [facility]  Nicki Guadalajara, M.D.  Soyla Murphy. Renne Crigler, M.D.

## 2010-07-23 NOTE — Consult Note (Signed)
NAME:  [name], [name]                    ACCOUNT NO.:  [id number]   MEDICAL RECORD NO.:  [id number]                   PATIENT TYPE:  INP   LOCATION:  2910                                 FACILITY:  [facility]   PHYSICIAN:  Evelene Croon, MD                    DATE OF BIRTH:  1940/03/21   DATE OF CONSULTATION:  DATE OF DISCHARGE:                                   CONSULTATION   REASON FOR CONSULTATION:  Left main and three-vessel coronary artery disease  status post non Q-wave myocardial infarction.   HISTORY OF PRESENT ILLNESS:  This patient is a 70 year old gentleman with  multiple cardiac risk factors who underwent lumbar spine surgery on  12/26/01.  A preoperative stress Cardiolite scan on 11/02/01 was read as  showing no ischemia.  During the lumbar spine surgery, the patient  developed supraventricular tachycardia with a heart rate of 150 and was  found to be in atrial flutter.  This was treated with intravenous Cardizem.  The patient ruled in for a non Q-wave myocardial infarction.  His CPK peaked  at 689 with an MB fraction of 12.7.  His troponin level was 0.05.  The  patient continued to have episodes of rapid atrial flutter and required  cardioversion at one point.  He had a cardiology consultation.  He was  treated with intravenous amiodarone as well as Lopressor.  He continued to  have episodes of tachycardia with a heart rate of 130 to 140.  He  subsequently underwent cardiac catheterization  on 01/01/02 which showed a  calcified 75% distal left main stenosis that extended into the ostium of the  LAD.  The LAD also had about 50% midvessel stenosis.  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  There is also about 80% obtuse marginal  stenosis.  The right coronary artery was a small nondominant vessel.  Left  ventricular ejection fraction was about 40% with moderate inferior  hypokinesis.  The  patient  also underwent an echocardiogram which showed  overall left ventricular systolic function that was normal estimated between  50 and 55% although the left ventricle was not well visualized. Left atrial  dimension was mildly enlarged at 43 mm.  There was no significant mitral  regurgitation.  There was mild mitral annular calcification.  The aortic  valve is grossly normal.   PAST MEDICAL HISTORY:  Significant for type 2 diabetes, hypertension, COPD  and asthma, gastroesophageal reflux disease, peripheral vascular disease  followed by Dr. Waverly Ferrari, degenerative arthritis and diabetic  neuropathy and retinopathy.   ALLERGIES:  None.   MEDICATIONS PRIOR TO ADMISSION:  1. Neurontin 800 mg t.i.d.  2. Vicodin q.6h p.r.n.  3. Metformin 1000 mg b.i.d.  4. Glucotrol XL 10 mg q.d.  5. Humibid LA 600 mg two p.o. b.i.d.  6. Accupril 40  mg q.d.  7. HCTZ 25 mg q.d.  8. BC powder four q.d.  9. Calcium tablets 2000 mg q.d.  10.      Pletal 100 mg b.i..d  11.      Centrum Silver multivitamin.   REVIEW OF SYSTEMS:  As noted on his cardiology consultation.   FAMILY HISTORY:  Strongly positive for heart disease with his father dying  of myocardial infarction.   SOCIAL HISTORY:  He is single and has one son and one daughter.  He is on  disability secondary to his back.  He smokes about three packs of cigarettes  per day and has smoked for at least 40 years.  He drinks about four ounces  of alcohol each evening.   PHYSICAL EXAMINATION:  VITAL SIGNS:  Blood pressure  150/60, pulse 130 and  regular.  Respiratory rate 18 and unlabored.  GENERAL:  He is an obese white male in no distress.  HEENT:  Normocephalic, atraumatic. The pupils are equal and reactive to  light and accommodation.  Extraocular muscles are intact. His throat is  clear.  NECK:  Shows normal carotid pulses bilaterally.  There are no bruits.  There  is no adenopathy or thyromegaly.  CARDIAC:  Shows a regular  rate and rhythm with no murmur, rub or gallops.  LUNGS:  Occasional rhonchi and expiratory wheezes.  ABDOMEN:  Active bowel sounds.  His abdomen is soft and obese and nontender.  There are no palpable masses or organomegaly.  EXTREMITIES:  Shows loss of hair over the lower legs.  His feet are cool.  Pedal pulses are not palpable.  NEUROLOGIC:  Alert and oriented x3.  Motor exam is grossly normal.  He has  decreased sensation in his feet.   Carotid Doppler examination shows a 40 to 60% right internal carotid artery  stenosis and no left internal carotid artery stenosis.  His ABI is .61 on  the right with monophasic wave forms and .71 on the left with monophasic  wave forms.   Chest x-ray is pending.   His admission EKG shows normal  sinus rhythm with no acute changes.  Electrolytes are normal with a BUN of 11 and creatinine of 1.0 on admission.  His hemoglobin was 15.1 on admission.  Platelet count was normal at 273,000.  His albumin was normal at 3.9.   IMPRESSION:  This patient  has left main and severe three-vessel coronary  artery disease status post non Q-wave myocardial infarction.  He also has  had rapid atrial flutter that has been difficult to control with medication.  I agree that coronary artery bypass graft surgery is the best treatment for  this patient  to prevent further ischemia and infarction.  He has had only  atrial flutter and no atrial fibrillation and therefore a maze procedure is  not indicated for this patient.  Postoperatively, his atrial flutter as it  recurs will need to be treated with medication.  If this is difficult to  control then he may need electrophysiology consultation and consideration of  catheter based ablation of the atrial flutter.  I have discussed the  operative procedure of coronary bypass surgery with he and his daughter  including alternatives, benefits and risks including bleeding, blood transfusion,  infection, stroke, respiratory  failure, myocardial infarction, and death.  I  also discussed postoperative arrhythmias that may be difficult to treat and  may require further procedures.  They understand and agree to proceed.  We  will plan to  do him on Friday 01/04/02.                                                  Evelene Croon, MD    BB/MEDQ  D:  01/02/2002  T:  01/03/2002  Job:  [id number]   cc:   Nanetta Batty, MD  [address], [address]  [place]  [place] [address]  Fax: [phone]

## 2010-07-23 NOTE — Cardiovascular Report (Signed)
NAME:  [name], RESER NO.:  [id number]   MEDICAL RECORD NO.:  [id number]          PATIENT TYPE:  INP   LOCATION:  2025                         FACILITY:  [facility]   PHYSICIAN:  Nanetta Batty, M.D.   DATE OF BIRTH:  [birth year]   DATE OF PROCEDURE:  03/06/2008  DATE OF DISCHARGE:                            CARDIAC CATHETERIZATION   HISTORY:  Mr. Fuston is a 70 year old gentleman with history of CAD  and PVOD.  He has had bypass grafting in the past.  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  He was admitted a week ago for peripheral angiography; however, he was  found to have an ulcer on his left great toe as well as significant  cellulitis in his right lower extremity.  He was seen by ID who put him  on vancomycin and Zosyn IV, which he has gotten over the last week.  His  cellulitis has improved significantly.  His renal function has remained  stable.  Because of duplex surveillance, suggested progression of  disease in his right external iliac artery, which could potentially  limit inflow and healing of his right leg.  He presents now for  angiography and potential intervention.   PROCEDURE DESCRIPTION:  The patient was brought to the second floor of  [facility] in the postabsorptive state.  He was  premedicated with p.o. Valium.  His right groin was prepped and shaved  in usual sterile fashion.  Xylocaine 1% was used for local anesthesia.  A 5-French sheath was inserted into the right femoral artery using  standard Seldinger technique.  A 5-French tennis racket catheter and end-  hole catheters were used for midstream in the distal abdominal  aortography with bifemoral runoff using digital subtraction bolus-chase  step-table technique.  A pullback gradient was performed across the  right iliac artery using a 5-French end-hole after administration of 200  mcg of intra-arterial nitroglycerin  via the side-arm sheath.  Visipaque  dye was used for the entirety of the case.  Retrograde aortic pressures  were monitored during the case.   ANGIOGRAPHIC RESULTS:  1. Abdominal aorta;      a.     Renal arteries - 56% left renal artery stenosis.      b.     Infrarenal abdominal aorta, mild atherosclerotic changes.  2. Left lower extremity;      a.     Occluded left external and common femoral artery.      b.     Total SFA with reconstitution above-the-knee.      c.     Two-vessel runoff.  3. Right lower extremity;      a.     A 50% to 60% segmental right external iliac artery stenosis       with a 20-mm pullback gradient.      b.     Total SFA at its origin reconstituting above-the-knee.      c.     Three-vessel runoff seen with delayed subtraction imaging.   IMPRESSION:  Mr. Thum anatomy is fairly unchanged.  I do not  think that his right external iliac artery is patent enough to warrant  intervention.  Continued medical therapy will be recommended.  He may be  able to be discharged home in the next several days on  oral antibiotics  and close outpatient followup.   The sheath was removed and pressures on the groin to achieve hemostasis.  The patient left the lab in stable condition.      Nanetta Batty, M.D.  Electronically Signed     JB/MEDQ  D:  07/23/2007  T:  07/24/2007  Job:  [id number]   cc:   [facility]  [facility]  Nicki Guadalajara, M.D.  Soyla Murphy. Renne Crigler, M.D.

## 2010-07-23 NOTE — Cardiovascular Report (Signed)
NAME:  [name], [name] NO.:  [id number]   MEDICAL RECORD NO.:  [id number]          PATIENT TYPE:  INP   LOCATION:  2005                         FACILITY:  [facility]   PHYSICIAN:  Nanetta Batty, M.D.   DATE OF BIRTH:  07/13/1940   DATE OF PROCEDURE:  01/11/2005  DATE OF DISCHARGE:                              CARDIAC CATHETERIZATION   [name] is a 70 year old patient of Dr. Caryn Section, Dr. Renne Crigler, and Dr. Devona Konig.  He has known CAD and PVOD.  He had coronary artery bypass grafting  in 2003.  He had occluded SFAs bilaterally and functional limiting  claudication.  He has a history of ongoing tobacco use two packs per day,  diabetes, hypertension, hyperlipidemia.  He had a non-ischemic Cardiolite  one year ago.  He was catheterized July 2004 revealing patent grafts with an  EF of 40%.  He did have 40% left renal artery stenosis.  Because of  progressive renal insufficiency, Dr. Caryn Section wanted the patient admitted,  hydrated for renal angiography to define his anatomy and rule out ischemic  etiology.   PROCEDURE DESCRIPTION:  Patient was brought to the sixth floor [facility] in the postabsorptive state.  He was  pre medicated with p.o. Valium.  His right groin was prepped and shaved in  the usual fashion.  1% Xylocaine was used for local anesthesia.  A 5-French  sheath was inserted into the right femoral artery using standard Seldinger  technique.  5-French tennis racquet catheter, short right Judkins catheter  were used for midstream abdominal aortography, selective right and left  renal artery angiography.  Visipaque dye was used for the entirety of the  case.  Retrograde aortic pressure was monitored during the case.   ANGIOGRAPHIC RESULTS:  1.  Right renal artery:  Normal.  2.  Left renal artery:  40% ostial superior accessory and 80% ostial      inferior accessory left renal artery.   IMPRESSION:  [name] has  patent right renal and left superior  accessory renal.  For the left inferior accessory renal has a high-grade  lesion on a bend and only supplies a small amount of renal tissue.  I do not  think his renal insufficiency is related to atherosclerotic renal artery  stenosis, rather hypertension and diabetes.   Sheaths were removed and pressure was held on the groin to achieve  hemostasis.  Patient left the laboratory in stable condition.  He will be  hydrated overnight.  __________ function will be assessed in the morning  after which time he will be discharged home.  He will follow up in  approximately one to two weeks.  Dr. Caryn Section was notified.      Nanetta Batty, M.D.  Electronically Signed     JB/MEDQ  D:  01/11/2005  T:  01/11/2005  Job:  [id number]   cc:   [facility]   [facility]   Wilber Bihari. Caryn Section, M.D.  Fax: [phone]   Soyla Murphy. Renne Crigler, M.D.  Fax: [phone]

## 2010-07-23 NOTE — Op Note (Signed)
   NAME:  [name], BEACH NO.:  [id number]   MEDICAL RECORD NO.:  [id number]                   PATIENT TYPE:  OIB   LOCATION:  2857                                 FACILITY:  [facility]   PHYSICIAN:  Nicki Guadalajara, M.D.                  DATE OF BIRTH:  06/15/1940   DATE OF PROCEDURE:  07/10/2002  DATE OF DISCHARGE:                                 OPERATIVE REPORT   PROCEDURE PERFORMED:  Elective DC cardioversion.   INDICATIONS FOR PROCEDURE:  The patient is a 70 year old gentleman with  known coronary artery disease status post coronary artery bypass grafting  revascularization surgery.  He had developed recurrent atrial fibrillation.  He has been successfully loaded with amiodarone and has had  therapeutic  anticoagulation.  He is now referred for outpatient DC cardioversion.   ANESTHESIA:  Anesthesia was provided by Dr. Noreene Larsson and the patient received  125 mg of sodium pentothal.   DESCRIPTION OF PROCEDURE:  Synchronized biphasic cardioversion was  performed.  The patient received 100 joules of energy.  This resulted in  return to normal sinus rhythm.  The patient tolerated the procedure well.  A  12-lead EKG documented sinus rhythm.  He will be sent home later today with  continuation of Coumadin anticoagulation as well as amiodarone therapy with  plans for office follow-up in several; weeks.                                               Nicki Guadalajara, M.D.    TK/MEDQ  D:  07/10/2002  T:  07/10/2002  Job:  [id number]   cc:   Soyla Murphy. Renne Crigler, M.D.  [address]  [place]  [place] [address]  Fax: [phone]

## 2010-07-23 NOTE — Discharge Summary (Signed)
[name], [name] NO.:  [id number]   MEDICAL RECORD NO.:  [id number]          PATIENT TYPE:  INP   LOCATION:  2005                         FACILITY:  [facility]   PHYSICIAN:  Nanetta Batty, M.D.   DATE OF BIRTH:  07/19/1940   DATE OF ADMISSION:  01/10/2005  DATE OF DISCHARGE:  01/12/2005                                 DISCHARGE SUMMARY   DISCHARGE DIAGNOSES:  1.  Peripheral vascular disease with moderate renal artery stenosis, with an      80% left inferior renal artery narrowing, with a 40% left superior      accessory renal artery, and normal right renal artery.  2.  Renal insufficiency, creatinine 1.4 at discharge.  3.  Coronary disease, coronary artery bypass grafting x3 in 2003, with      negative Cardiolite, December 01, 2003.  4.  Non-insulin-dependent diabetes.  5.  Hypertension, treated.  6.  Hyperlipidemia, treated.  7.  History of chronic obstructive pulmonary disease.  8.  Peripheral neuropathy, question secondary to diabetes versus his      amiodarone.  9.  Past history of paroxysmal atrial fibrillation, holding sinus rhythm on      amiodarone.   HOSPITAL COURSE:  [name] is a 70 year old male with coronary  disease who underwent bypass surgery in 2003.  He has had a negative  Cardiolite.  He has had remote PAF and has been maintaining sinus rhythm on  amiodarone, he has been on this for several years according to his history.  He has had some problems with falls.  His Coumadin has been discontinued. He  has known peripheral vascular disease with bilateral SFA disease that is  treated medically.  He has had some renal artery stenosis and has had  fluctuating creatinines.  He was admitted for further evaluation and  catheterization to define his renal artery anatomy.  This was done on an  elective basis, January 10, 2005 by, Dr. Allyson Sabal.  He was treated with  Mucomyst and sodium bicarb pre-cath.  He tolerated the procedure well.   His  catheterization revealed a normal right renal artery, 40% superior left  accessory renal artery and 80% inferior accessory left renal artery.  Dr.  Allyson Sabal felt the inferior left renal artery supplied a small amount of renal  issue and did not think his renal insufficiency was related to this.  The  patient tolerated procedure well.  We feel he can be discharged January 12, 2005.   The patient's main complaint is numbness and tingling in his hands and feet.  He has been on Lyrica without improvement, and he also has taken Neurontin.  During this hospitalization, a question was brought up of whether this  possibly could be from amiodarone.  Dr. Allyson Sabal would like the patient to see  the neurology service for their input, and he has been set up for outpatient  appointment.   DISCHARGE MEDICATIONS:  1.  Avapro 150 mg a day.  2.  Avandia 4 mg a day.  3.  Pletal 100 mg twice a day.  4.  Neurontin 800 mg t.i.d.  5.  Amiodarone 200 mg a day.  6.  Lopressor 50 mg twice a day.  7.  Lasix 40 mg a day.  8.  Potassium 10 mEq a day.   LABORATORY:  Sodium of 137, potassium 4.3, BUN 15, creatinine 1.5.  TSH  0.47.  White count 7.3, hemoglobin 13.3, hematocrit 39.6, platelets 274.  UA  is unremarkable.  INR 1.0.  Chest x-ray shows no acute changes.  There is  some atelectasis versus scar to the left lung base.   DISPOSITION:  The patient is discharged in stable condition and will follow  up with Dr. Allyson Sabal on November 29 at 9:45.  The neurology service will  contact him for outpatient appointment.      Abelino Derrick, P.A.      Nanetta Batty, M.D.  Electronically Signed    LKK/MEDQ  D:  01/12/2005  T:  01/12/2005  Job:  [id number]   cc:   Nanetta Batty, M.D.  Fax: [phone]   Soyla Murphy. Renne Crigler, M.D.  Fax: [phone]   [facility]

## 2010-07-23 NOTE — Op Note (Signed)
NAME:  [name], BROZ NO.:  [id number]   MEDICAL RECORD NO.:  [id number]                   PATIENT TYPE:  INP   LOCATION:  3114                                 FACILITY:  [facility]   PHYSICIAN:  Cristi Loron, M.D.            DATE OF BIRTH:  11-Nov-1940   DATE OF PROCEDURE:  12/26/2001  DATE OF DISCHARGE:                                 OPERATIVE REPORT   BRIEF HISTORY:  The patient is a 70 year old white male who has suffered  from back and bilateral leg pain consistent with neurogenic claudication.  He also has known peripheral vascular disease.  The patient has other  medical problems including a cardiac history.  He had a preoperative cardiac  clearance.   The patient was further worked up with a lumbar MRI which demonstrated an L5-  S1 spondylolistheses, spondylolysis and spinal stenosis.  The patient's  symptoms were consistent with neurogenic claudication and spinal stenosis.  I discussed the various treatment options with him including doing nothing,  continuing medical management, steroid injections and surgery.  The patient  weighted the risks, benefits and alternatives of surgery and decided to  proceed with the operation.   PREOPERATIVE DIAGNOSIS:  L5-S1 spondylolisthesis, spondylolysis,  degenerative disc disease, stenosis, lumbago, lumbar radiculopathy.   POSTOPERATIVE DIAGNOSIS:  L5-S1 spondylolisthesis, spondylolysis,  degenerative disc disease, stenosis, lumbago, lumbar radiculopathy.   OPERATION:  L5 Gill procedure; L5-S1 posterior lumbar interbody fusion;  placement of bilateral tangent cortical bone dowels in the interbody space;  posterior nonsegmental instrumentation with end plates CV Horizon pedicle  screws and rods; posterior lateral arthrodesis L5-S1 with local morselized  Allograft bone and Vitoss bone scaffolding.   SURGEON:  Cristi Loron, M.D.   ASSISTANT:  Hilda Lias, M.D.   ANESTHESIA:  General  endotracheal.   ESTIMATED BLOOD LOSS:  300 cc.   SPECIMENS:  None   DRAINS:  None   COMPLICATIONS:  The patient went into atrial flutter prior to the initiation  of surgery.  He is hemodynamically stable and was just managed medically  throughout the operation.   DESCRIPTION OF PROCEDURE:  The patient was brought to the operating room by  anesthesia team, general endotracheal anesthesia was induced.  The patient  was then turned to the prone position on the chest rolls.  His lumbosacral  region was shaved and prepared with Betadine scrub and Betadine solution.  Sterile drapes were applied. Then injected the area to be incised with  Marcaine with epinephrine solution.  I used a scalpel to made a linear  midline incision over the L5-S1 interspace.  Used electrocautery to dissect  down to the thoracolumbar fascia and then divided the fascia bilaterally  performing a bilateral subperiosteal dissection.  Stripped the parous  spinous musculature from the process of the lamina from L4 to the upper  segment.  A McCall retractor was inserted for exposure.  An intraoperative  radiograph was then obtained to confirm  our location.   I began the [place] procedure by incising the L5-S1 and the L4-5 interspinous  ligament and then used the Leksell rongeurs to remove the L5 spinous process  and L5 lamina.  Then used a Kerrison punch to remove the L5-S1 ligamentum  flavum.  Performed foraminotomy at the bilateral S1 nerve root and then to  remove the ring of the L5 lamina as well as the abnormal pars region.  It  had the typical appearance of a spondylolysis with a free floating lamina  and stenosis about the exiting L5 nerve root and removed more bone on the  upper lateral recess performing a foraminotomy about the L5 nerve root and  removed the L4-5 ligamentum flavum and got good decompression of the thecal  sac from the L4-5 interspace all the way down to the sacrum and performed a  foraminotomy  about the bilateral L5-S1 nerve roots concluding the Gill  procedure.   We now turned our attention to the posterior lumbar interbody fusion.  The  thecal sac and S2 nerve roots were freed from up from tissues and retracted  medially and the L5-S1 intervertebral disc was incised with a scalpel and a  progressive diskectomy was performed using the pituitary forceps.  The disc  space was quite collapsed.  We inserted a vertebral body spreader into the  interspace.  Then the vertebral end plates were prepared using the Epstein  Scoville curets as well as the interbody cutter and the chisel in  preparation for the insertion of the tangent bone dowels and the posterior  lumbar interbody fusion. Bilateral 8 mm tangent bone dowels placed.  One was  24 mm in length and the other was 26 mm.  They were counter sunk into the  interspace well, obviously after retracting the nerve roots out of harm's  way.  In between the tangent bone dowels was packed with Vitoss bone  scaffolding completing the posterior lumber interbody fusion.   We now turned our attention to the posterior nonsegmental instrumentation.  Electrocautery was used to expose the spinal process of L4 and the sacral  ala.  Under fluoroscopic guidance the drill was used to decorticate  posterior to the bilateral L5 and S1 pedicle and then the pedicle was  cannulated using bone probe, tapped using the 5.5 tap and 7.5 x 50 mm  pedicle screws were  placed into the left L5 pedicle, a 7.5 x 45 mm pedicle screw was placed in  the left S1 pedicle, a 6.5 x 50 mm pedicle screw was placed in the right L5  pedicle and a 6.5 x 45 mm pedicle screw was placed in the right S1 pedicle.  All of this was done under fluoroscopic guidance and after palpating the  interior of the pedicle to rule out a cortical breech.  The left S1 pedicle  did fracture in the process of putting the screw in but the fractured bone was removed and then inspected the left S1  nerve root and there was actually  no compression on the nerve root from the pedicle screw.  In fact, inspected  down the bilateral S1 and L5 nerve roots and noted that they were well  decompressed.  The unilateral pedicle screws were connected using the  appropriate length and then the caps were tightened using the torque wrench.   We now turned our attention to the posterior lateral arthrodesis.  The  remaining bony structures in the remainder of the pars region, the S1 lamina  and the transverse process at L5 bilaterally and the sacral ala were  decorticated using high speed drill and then a combination of localized  morselized Allograft bone and Vitoss bone scaffolding was placed over the  decorticated structures completing the posterior lateral arthrodesis.   We then inspected the thecal sac and the bilateral L5-S1 nerve roots and  noted that they were well decompressed.  We used stringent hemostasis, using  bipolar electrocautery and Gelfoam.  The wound was irrigated with Bacitracin  solution.  Spreaders were removed as well a the McCall retractor and then  thoracolumbar fascia was reapproximated with interrupted #1 Vicryl suture  and the subcutaneous using interrupted 3-0 Vicryl suture and the skin with  Steri-Strips and Benzoin.  The wound was then coated with Bacitracin  ointment and a sterile dressing was applied.  The  drapes were removed and the patient was subsequently returned to a supine  position where he was extubated by the anesthesia team and transported to  the postanesthesia care unit in a stable condition with all sponge,  instrument and needle counts being correct at the end of this case.                                               Cristi Loron, M.D.    JDJ/MEDQ  D:  12/26/2001  T:  12/27/2001  Job:  [id number]

## 2010-07-23 NOTE — Discharge Summary (Signed)
NAME:  [name], [name]                    ACCOUNT NO.:  [id number]   MEDICAL RECORD NO.:  [id number]                   PATIENT TYPE:  INP   LOCATION:  2025                                 FACILITY:  [facility]   PHYSICIAN:  Evelene Croon, M.D.                  DATE OF BIRTH:  05-Dec-1940   DATE OF ADMISSION:  12/26/2001  DATE OF DISCHARGE:  01/08/2002                                 DISCHARGE SUMMARY   ADMISSION DIAGNOSIS:  Chest pain.   SECONDARY DIAGNOSES:  1. Non-insulin-dependent diabetes mellitus.  2. Hypertension.  3. Peripheral vascular disease.  4. Postoperative atrial fibrillation.  5. Postoperative anemia secondary to blood loss.   DISCHARGE DIAGNOSIS:  Coronary artery disease.   HOSPITAL COURSE:  The patient was admitted to [facility] on December 26, 2001, after experiencing some substernal chest pain.  Because of this, he was seen and evaluated by cardiologist upon admission.  He subsequently had cardiac catheterization performed which revealed he had  significant coronary artery disease amenable to surgical correction. Because  of this, Evelene Croon, M.D., was consulted.  On January 04, 2002, Dr. Laneta Simmers  performed a coronary artery bypass graft x3 with left internal mammary  artery anastomosed to left anterior descending coronary artery, saphenous  vein graft to obtuse marginal artery and saphenous vein graft to the  intermediate artery.  No complications were noted during this procedure.   Postoperatively the patient had mild postoperative anemia secondary to blood  loss which he tolerated well.  No transfusion was required.  His hemoglobin  and hematocrit stabilized at 10.3 and 30.5.  His BUN and creatinine  stabilized at 10 and 0.9.  Postoperatively, his postoperative course was  complicated by atrial fibrillation and atrial flutter.  This was rate  controlled with digoxin and Lopressor.  The patient was also started on  amiodarone and  converted to normal sinus rhythm before the time of  discharge.  He was in normal sinus rhythm at the time of discharge.  Adequate control of his blood sugars were achieved with Glucophage and  Glucotrol which the patient took at home.  The remainder of his  postoperative course was uneventful and he was subsequently deemed stable  for discharge home in satisfactory condition on January 08, 2002.   DISCHARGE MEDICATIONS:  1. Digoxin 0.125 mg one daily.  2. Neurontin 800 mg three times daily.  3. Aspirin 325 mg one daily.  4. Prevacid 30 mg one daily.  5. Amiodarone 200 mg two tablets daily.  6. Toprol XL 25 mg one daily.  7. Niferex 150 one daily.  8. Glucotrol 10 mg one daily.  9. Niacin 500 mg one daily.  10.      Pravachol 10 mg one daily.  11.      Glucophage 500 mg two tablets twice daily.  12.      Avapro 75 mg one daily.  13.  Ultram 50 mg one to two tablets q.4-6h. p.r.n. pain.   ACTIVITY:  The patient was told to avoid driving, strenuous activity, and  lifting heavy objects.  He was told to walk daily and continue his incentive  spirometer.   DISCHARGE DIET:  An 1800 calorie ADA diet.   WOUND CARE:  The patient was told he could shower and clean his incisions  with soap and water.   DISPOSITION:  Home.   FOLLOW UP:  The patient was told to call his cardiologist, Nanetta Batty,  M.D., for two-week follow-up appointment.  He was told to see Dr. Evelene Croon at the CVTS office on Tuesday, January 29, 2002, at 9:30 a.m.  He  was told to go to the [facility] one hour before this  appointment for a chest x-ray.     Levin Erp. Steward, P.A.                      Evelene Croon, M.D.    BGS/MEDQ  D:  01/07/2002  T:  01/08/2002  Job:  [id number]   cc:   [facility]   [facility] office

## 2010-07-23 NOTE — Op Note (Signed)
NAME:  [name], [name]                    ACCOUNT NO.:  [id number]   MEDICAL RECORD NO.:  [id number]                   PATIENT TYPE:  INP   LOCATION:  2312                                 FACILITY:  [facility]   PHYSICIAN:  Evelene Croon, M.D.                  DATE OF BIRTH:  01/20/1941   DATE OF PROCEDURE:  01/04/2002  DATE OF DISCHARGE:                                 OPERATIVE REPORT   PREOPERATIVE DIAGNOSIS:  Left main and severe three-vessel coronary artery  disease, status post non-Q-wave myocardial infarction.   POSTOPERATIVE DIAGNOSIS:  Left main and severe three-vessel coronary artery  disease, status post non-Q-wave myocardial infarction.   PROCEDURES:  1. Median sternotomy.  2. Extracorporeal circulation.  3. Coronary artery bypass graft surgery x3 using a left internal mammary     artery graft to the left anterior descending coronary artery, with a     saphenous vein graft to the obtuse marginal branch of the left circumflex     coronary artery, and a saphenous vein graft to the intermediate coronary     artery.  4. Endoscopic vein harvesting from the left leg.   SURGEON:  Evelene Croon, M.D.   ASSISTANT:  Coral Ceo, P.A.   ANESTHESIA:  General endotracheal.   CLINICAL HISTORY:  This patient is a 70 year old gentleman with multiple  cardiac risk factors, including diabetes, hypercholesterolemia, positive  family history of heart disease, and heavy smoking, who underwent lumbar  spine surgery on 12/26/01.  A preoperative Cardiolite scan on 11/02/01  reportedly showed no ischemia.  During his spine surgery he developed  supraventricular tachycardia and was diagnosed with atrial flutter.  He  ruled in for a non-Q-wave myocardial infarction.  He underwent cardiac  catheterization, which showed about 75% calcified distal left main stenosis  extending into the proximal LAD.  There was a small bifurcating intermediate  artery that came off in this area.  The LAD was a  large vessel that wrapped  around the apex.  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  It had about 80% proximal stenosis.  There was a  single large marginal branch that had an 80% stenosis.  The left circumflex  terminated as a small posterior descending branch.  The right coronary  artery was a small, nondominant vessel.  Left ventricular ejection fraction  was about 40% with moderate inferior hypokinesis.  After review of the  angiogram and examination of the patient, it was felt that coronary artery  bypass graft surgery was the best treatment.  I discussed the operative  procedure with the patient and his daughter, including alternatives,  benefits, and risks, including bleeding, blood transfusion, infection,  stroke, myocardial infarction, and death.  They understood and agreed to  proceed.   DESCRIPTION OF PROCEDURE:  The patient was taken to the operating room and  placed on the table in the supine position.  After induction of general  endotracheal anesthesia, a Foley catheter was placed in the bladder using  sterile technique.  Then the chest, abdomen, and both lower extremities were  prepped and draped in the usual sterile manner.  His chest was entered  through a median sternotomy incision and the pericardium opened in the  midline.  Examination of the heart showed a large amount of epicardial fat.  The ascending aorta had some palpable plaque in it.   Then the left internal mammary artery was harvested from the chest wall as a  pedicle graft.  This was a medium-caliber vessel with excellent blood flow  through it.  At the same time a segment of greater saphenous vein was  harvested from the left thigh using endoscopic vein harvest technique.  This  vein was of medium size and good quality.   Then the patient was heparinized and when an adequate activated clotting  time was achieved, the distal ascending aorta was cannulated using a  20  French aortic cannula for arterial inflow.  Venous outflow was achieved  using a two-stage venous cannula through the right atrial appendage.  An  antegrade cardioplegia and vent cannula was inserted in the aortic root.   The patient was placed on cardiopulmonary bypass and the distal coronary  arteries identified.  The LAD was a large, graftable vessel that had some  palpable plaque in its midportion.  The diagonal branch was a large vessel.  There did not appear to be any stenosis in this on the angiogram.  The  intermediate artery was small but graftable.  The obtuse marginal branch was  a medium-sized graftable vessel.  The distal left circumflex terminated as a  tiny posterior descending branch that was lying high on the posterolateral  wall beneath a large epicardial vein and was not graftable.  The LAD did  wrap around the apex and supplied the majority of the inferior wall.  The  right coronary artery as a small, nondominant vessel.   Then the aorta was crossclamped and 500 cc of cold blood antegrade  cardioplegia was administered in the aortic root with quick arrest of the  heart.  Systemic hypothermia to 20 degrees Centigrade and topical  hypothermia with iced saline was used.  A temperature probe was placed in  the septum and an insulating pad in the pericardium.   The first distal anastomosis was performed to the obtuse marginal branch.  The internal diameter was 1.6 mm.  The conduit used was a segment of greater  saphenous vein and the anastomosis performed in an end-to-side manner using  continuous 7-0 Prolene suture.  Flow was measured through the graft and was  excellent.  Then another dose of cardioplegia was given through this graft.   The second distal anastomosis was performed to the intermediate coronary  artery.  The internal diameter was about 1.5 mm.  The conduit used was a second segment of greater saphenous vein and the anastomosis performed in an   end-to-side manner using continuous 8-0 Prolene suture.  Flow was measured  through the graft and was fair.   Then the third distal anastomosis was performed to the distal portion of the  left anterior descending coronary artery.  The internal diameter was about 2  mm.  The conduit used was the left internal mammary graft, and this was  brought through an opening in the left pericardium anterior to the phrenic  nerve.  It was anastomosed to the LAD in an  end-to-side manner using  continuous 8-0 Prolene suture.  The pedicle was tacked to the epicardium  with 6-0 Prolene sutures.  The patient was rewarmed to 37 degrees Centigrade  and the clamp removed from the mammary pedicle.  There was rapid warming of  the ventricular septum and return of spontaneous ventricular fibrillation.  The crossclamp was removed with a time of 52 minutes, and the patient was  spontaneously converted to sinus rhythm.   A partial occlusion clamp was placed on the aortic root and the two proximal  vein graft anastomoses were performed in an end-to-side manner using  continuous 6-0 Prolene suture.  The clamp was removed, the vein grafts de-  aired, and the clamps removed from them.  The proximal and distal  anastomoses appeared hemostatic and the alignment of the grafts  satisfactory.  Graft markers were placed around the proximal anastomoses.  Two temporary right ventricular and right atrial pacing wires were placed  and brought out through the skin.   When the patient had rewarmed to 37 degrees Centigrade, he was weaned from  cardiopulmonary bypass on no inotropic agents.  Total bypass time was 98  minutes.  Cardiac function appeared excellent with a cardiac output of 6  L/min.  Protamine was given, and the venous and aortic cannulas were removed  without difficulty.  Hemostasis was achieved.  Three chest tubes were  placed, with two in the posterior pericardium, one in the left pleural  space, and one in the  anterior mediastinum.  The pericardium was  reapproximated over the heart.  The sternum was closed with #6 stainless  steel wire.  Fascia was closed using continuous #1 Vicryl suture.  The  subcutaneous tissue was closed using continuous 2-0 Vicryl and the skin with  3-0 Vicryl subcuticular closure.  The lower extremity vein harvest site was  closed in layers in a similar manner.  The sponge, needle, and instrument  counts were correct according to the scrub nurse.  Dry sterile dressings  were applied over the incisions and around the chest tubes, which were  hooked to Pleuravac suction.  The patient remained hemodynamically stable  and was transported to the SICU in guarded but stable condition.                                                 Evelene Croon, M.D.    BB/MEDQ  D:  01/04/2002  T:  01/04/2002  Job:  [id number]   cc:   Nanetta Batty, M.D.  [address], [address]  [place]  [place] [address] Fax: [phone]   [facility]

## 2010-07-23 NOTE — Op Note (Signed)
NAME:  CHI, GARLOW NO.:  [id number]   MEDICAL RECORD NO.:  [id number]          PATIENT TYPE:  AMB   LOCATION:  ENDO                         FACILITY:  [facility]   PHYSICIAN:  Georgiana Spinner, M.D.    DATE OF BIRTH:  1940/09/28   DATE OF PROCEDURE:  DATE OF DISCHARGE:                                 OPERATIVE REPORT   PROCEDURE:  Upper endoscopy.   INDICATIONS:  GERD.   ANESTHESIA:  Fentanyl 75, Versed 7.5 mg   DESCRIPTION OF PROCEDURE:  With the patient mildly sedated in the left  lateral decubitus position, the Olympus videoscopic endoscope was inserted  into the mouth, passed under direct vision through the esophagus, which  appeared normal, until we reached the distal esophagus; and there were  changes of Barrett's esophagus, photographed and biopsied.  The endoscope  was then advanced into the stomach.  The fundus, body, antrum, duodenal  bulb, and second portion duodenum were visualized. From this point the  endoscope was slowly withdrawn taking circumferential views of the duodenal  mucosa until the endoscope was then pulled back into the stomach and placed  in retroflexion to view the stomach from below. The endoscope was then  straightened and withdrawn taking circumferential views of the remaining  gastric esophageal mucosa. The patient's vital signs and pulse oximeter  remained stable. The patient tolerated the procedure well without  complications.   FINDINGS:  Possibly early stricture of distal esophagus with changes of  Barrett's esophagus, photographed and biopsied, to await biopsy report. The  patient will call for results and followup with me as outpatient. Proceed to  colonoscopy.           ______________________________  Georgiana Spinner, M.D.     GMO/MEDQ  D:  11/18/2004  T:  11/18/2004  Job:  [id number]

## 2010-07-23 NOTE — Op Note (Signed)
NAME:  [name], [name] NO.:  [id number]   MEDICAL RECORD NO.:  [id number]          PATIENT TYPE:  AMB   LOCATION:  ENDO                         FACILITY:  [facility]   PHYSICIAN:  Georgiana Spinner, M.D.    DATE OF BIRTH:  08-Nov-1940   DATE OF PROCEDURE:  07/12/2006  DATE OF DISCHARGE:                               OPERATIVE REPORT   PROCEDURE:  Upper endoscopy with biopsy.   INDICATIONS:  Abdominal pain.   ANESTHESIA:  Fentanyl 25 mcg, Versed 5 mg.   PROCEDURE:  With the patient mildly sedated in the left lateral  decubitus position the Pentax videoscopic endoscope was inserted in the  mouth and passed under direct vision through the esophagus which showed  changes of esophagitis in mid esophagus.  We advanced past this to the  distal esophagus and subsequently into the stomach. Fundus, body,  antrum, duodenal bulb, second portion duodenum were visualized. From  this point the endoscope was slowly withdrawn taking circumferential  views of duodenal mucosa until the endoscope had been pulled back into  the stomach and placed in retroflexion to view the stomach from below.  The endoscope was straightened and biopsies were taken of the  erythematous appearing antrum.  We also biopsied distal esophagus to  rule out Barrett's esophagus that was noted and we stopped in the mid  esophagus where changes of esophagitis were noted and this was biopsied  as well.  The endoscope was withdrawn.  The patient's vital signs, pulse  oximeter remained stable.  The patient tolerated procedure well without  apparent complications.   FINDINGS:  Changes of antritis.  Barrett's esophagus distally and in the  mid esophagus and fairly extensive esophagitis biopsied.  Await biopsy  reports.  The patient will call me for results and follow-up with me as  needed as an outpatient.           ______________________________  Georgiana Spinner, M.D.     GMO/MEDQ  D:  07/12/2006  T:  07/12/2006   Job:  [id number]

## 2010-07-23 NOTE — Op Note (Signed)
   NAME:  [name], [name]                    ACCOUNT NO.:  [id number]   MEDICAL RECORD NO.:  [id number]                   PATIENT TYPE:  AMB   LOCATION:  ENDO                                 FACILITY:  [facility]   PHYSICIAN:  Georgiana Spinner, M.D.                 DATE OF BIRTH:  01/28/1941   DATE OF PROCEDURE:  07/17/2002  DATE OF DISCHARGE:                                 OPERATIVE REPORT   PROCEDURE:  Colonoscopy.   INDICATIONS FOR PROCEDURE:  Colon cancer screening, colon polyp.   ANESTHESIA:  Demerol 40, Versed 4 mg.   DESCRIPTION OF PROCEDURE:  With the patient mildly sedated in the left  lateral decubitus position, the Olympus videoscopic colonoscope was inserted  in the rectum and passed under direct vision to the cecum identified by the  ileocecal valve and appendiceal orifice both of which were photographed.  From this point, the colonoscope was slowly withdrawn taking circumferential  views of the entire colonic mucosa stopping at 50 cm from the anal verge at  which a point a polyp was seen, photographed and removed using hot biopsy  forceps technique setting of 20 and 150 on the ERBE argon photo coagulator.  The endoscope was then withdrawn all the way to the rectum which appeared  normal on direct and showed hemorrhoids on retroflexed view. The endoscope  was straightened and withdrawn. The patient's vital signs and pulse oximeter  remained stable. The patient tolerated the procedure well without apparent  complications.   FINDINGS:  Colonic polyp at 50 cm from the anal verge, await biopsy report.  The patient will call me for results and followup with me as an outpatient.                                               Georgiana Spinner, M.D.    GMO/MEDQ  D:  07/17/2002  T:  07/17/2002  Job:  [id number]

## 2010-07-23 NOTE — Cardiovascular Report (Signed)
NAME:  [name], [name] NO.:  [id number]   MEDICAL RECORD NO.:  [id number]          PATIENT TYPE:  OIB   LOCATION:  2899                         FACILITY:  [facility]   PHYSICIAN:  Nanetta Batty, M.D.   DATE OF BIRTH:  09/18/1940   DATE OF PROCEDURE:  02/16/2004  DATE OF DISCHARGE:                              CARDIAC CATHETERIZATION   Mr. Richman is a 70 year old white male with history of CAD, status post  bypass grafting in October of 2000 with an EF of 40%.  He has PAF on  Coumadin and amiodarone, though he had remained in sinus rhythm.  He does  continue to smoke and has non-insulin dependent diabetes, hypertension, and  hyperlipidemia as well as report peptic ulcer disease.  He is complaining of  claudication and had normal ABIs in outside facility and was referred here  for further evaluation.   DESCRIPTION OF PROCEDURE:  The patient is brought to the sixth floor [facility] Angi[facility]  [facility] in the  post absorptive suite.  He was premedicated with p.o. Valium.  His right  groin was prepped and shaved in usual sterile fashion.  Xylocaine 1% was  used for local anesthesia.  His Coumadin was held several days prior to the  procedure with INR measured at 1.4.  A 5 French sheath was inserted into the  right femoral artery using standard Seldinger technique.  A 5 French  __________ catheter was used for mid stream and distal abdominal aortography  as well as bifemoral runoff.  Visipaque dye was used for the entirety of the  case.  Retrograde aortic pressure is monitored during the case.   ANGIOGRAPHIC RESULTS:  1.  Abdominal aorta:      1.  Renal arteries:  A 40% right renal artery stenosis.      2.  Infrarenal abdominal aorta:  Moderate distal atherosclerotic          narrowing.  2.  Left lower extremity:      1.  Total left SFA with reconstitution of the above the knee popliteal          by profunda femoris  collaterals.      2.  Two vessel runoff with occluded posterior tibial.  3.  Right lower extremity:      1.  A 50% segmental right external iliac artery  stenosis.      2.  Total SFA at its origin with reconstitution of the above the knee          popliteal by profunda femoral collaterals.      3.  Two vessel runoff.   IMPRESSION:  Mr. 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  We are going to begin him on Pletal 50 mg p.o.  b.i.d.  He will be discharged home as an outpatient and will restart his  routine home Coumadin dose in three days.  I will see him back in the office  in one to two weeks in follow-up.   The sheaths were removed and pressure was held on the groin to achieve  hemostasis.  The patient left the lab in stable condition.      JB/MEDQ  D:  02/16/2004  T:  02/16/2004  Job:  [id number]   cc:   Nicki Guadalajara, M.D.  [phone] [address], [address]  [place], [place] [address]  Fax: [phone]   Soyla Murphy. Renne Crigler, M.D.  [address] [place] [address]  [place]  [place] [address]  Fax: [phone]

## 2010-07-23 NOTE — Cardiovascular Report (Signed)
NAME:  [name], [name] NO.:  [id number]   MEDICAL RECORD NO.:  [id number]                   PATIENT TYPE:  INP   LOCATION:  3106                                 FACILITY:  [facility]   PHYSICIAN:  Nanetta Batty, MD                  DATE OF BIRTH:  [birth year]   DATE OF PROCEDURE:  01/01/2002  DATE OF DISCHARGE:                              CARDIAC CATHETERIZATION   PROCEDURE PERFORMED:  Cardiac catheterization.   INDICATION:  The patient is a 70 year old white male, status post  decompressive laminectomy and fusion by Dr. Peggye Ley.  He has  positive risk factors for CAD and had preoperative PAF with a small  subendocardial infarct.  He presents now for diagnostic coronary  arteriography.   DESCRIPTION OF PROCEDURE:  The patient was brought to the second floor [facility] in the postabsorptive state. He was  premedication with p.o. Valium.  His right groin was prepped and shaved  in  the usual sterile fashion.  Then 1% Xylocaine was used for local anesthesia.  A 6 French sheath was inserted into the right femoral artery using standard  Seldinger technique.  A 6 French right and left Judkins diagnostic catheter  as well as a 6 French pigtail catheter were used for selective coronary  angiography, left ventriculography, subselective left internal mammary  artery angiography, and distal abdominal aortography.  Omnipaque dye was  used for the entirety of the case.  Retrograde, aortic, left ventricular and  pullback pressures were recorded.   HEMODYNAMICS:  1. Aortic systolic pressure 166, diastolic pressure 78.  2. Left ventricular systolic pressure 167 and diastolic pressure 22.   SELECTIVE CORONARY ANGIOGRAPHY:  There was a 75% distal left main, proximal  segmental LAD which was calcified on a bend.  There was large first septal  perforator, two small first diagonal branches and one large diagonal branch  arising from the junction of the proximal and mid third.  The LAD wrapped  the apex of the heart.  There is no obvious circumflex noted.   Right coronary artery:  Nondominant. 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   Left circumflex:  Arise anomalously from the right coronary ostia.  There is  an 80% segmental proximal stenosis followed by an 80% segmental proximal  stenosis in the first OM.   LEFT VENTRICULOGRAPHY:  RAO left ventriculogram was performed using 25 cc of  Omnipaque dye at 12 cc/sec.  The overall LVEF was estimated at approximately  40% with moderate inferior hypokinesia.   Left internal mammary artery:  This vessel was subselective visualized and  was widely patent.  It was suitable for use during coronary artery bypass  grafting.   DISTAL ABDOMINAL AORTOGRAPHY:  Distal abdominal aortogram was performed  using  20 cc of Omnipaque dye at 20 cc/sec. There was a 50% left renal artery  stenosis.  The infrarenal abdominal aorta was moderately atherosclerotic but  not obstructive or significantly aneurysmal.  The iliac bifurcation was free  of significant disease.   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  He is  anatomically a candidate for coronary artery bypass grafting.   The sheaths were removed and pressure was held on the groin to achieve  hemostasis.  The patient left the lab in stable condition and returned to  his room.  Dr. Evelene Croon was consulted for surgical evaluation.                                                       Nanetta Batty, MD    JB/MEDQ  D:  01/01/2002  T:  01/01/2002  Job:  [id number]   cc:   Cardiac Catheterization Laboratory

## 2010-07-23 NOTE — Cardiovascular Report (Signed)
NAME:  [name], ACKERS NO.:  [id number]   MEDICAL RECORD NO.:  [id number]                   PATIENT TYPE:  INP   LOCATION:  4705                                 FACILITY:  [facility]   PHYSICIAN:  Nanetta Batty, M.D.                DATE OF BIRTH:  03-Apr-1940   DATE OF PROCEDURE:  DATE OF DISCHARGE:                              CARDIAC CATHETERIZATION   Mr. 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  He underwent coronary artery bypass grafting by Evelene Croon, M.D., January 04, 2002, with a LIMA to his LAD, vein graft to  intermediate and to the circumflex marginal branch.  He is on Coumadin for  PAF and was admitted July 6 with unstable angina.  He ruled out for  myocardial infarction.  A Persantine Thallium revealed apical  redistribution.  His INR was documented at 1.5 after his Coumadin was held,  and he was placed on heparin.  He presents now for diagnostic coronary  arteriography to rule out an ischemic etiology.   DESCRIPTION OF PROCEDURE:  The patient was brought to the second floor [facility] cardiac catheterization lab in the postabsorptive state.  He was  premedicated with p.o. Valium.  His right groin was prepped and shaved in  the usual sterile fashion.  Xylocaine 1% was used for local anesthesia.  A 6  French sheath was inserted into the right femoral artery using standard  Seldinger technique.  Six French right and left Judkins diagnostic catheters  as well as a 6 French pigtail catheter were used for selective coronary  angiography, left ventriculography, selective vein graft angiography,  selective IMA angiography, and distal abdominal aortography.  A pigtail was  used for the entirety of the case.  __________ aorta, left ventricular, and  pulmonary artery pressures were recorded.   HEMODYNAMICS:  1. Aortic systolic  pressure 150, diastolic pressure 71.  2. Left ventricular systolic pressure 149, end-diastolic pressure of 27.   SELECTIVE CORONARY ANGIOGRAPHY:  1. Left main:  Left main had approximately 40% ostial stenosis without     damping.  2. LAD:  The LAD had a 75-80% proximal stenosis, which was calcified on the     bend.  There was competitive flow distally.  3. Ramus intermedius branch:  This vessel arose from the distal left main     proximal to the LAD lesion and exhibited competitive flow.  4. Right coronary artery:  This was a nondominant vessel.  5. Left circumflex:  This arose anomalously from the right coronary artery     ostium and exhibited competitive flow.  6. Vein graft to the intermediate branch:  Widely patent.  7. Vein graft to the circumflex marginal branch:  Widely patent.  8. LIMA to the LAD:  Widely patent.  The LAD did have a large diagonal  branch which arose in the proximal third, which was not bypassed.  The     LIMA gave off an intercostal branch, which was a large vessel and was not     anastomosed to any cardiac structure.   LEFT VENTRICULOGRAPHY:  RAO left ventriculogram was performed using 20 mL of  Omnipaque dye at 10 mL/sec.  The overall LVEF is estimated at approximately  40% with superior, inferior, and apical hypokinesis.   DISTAL ABDOMINAL AORTOGRAPHY:  Distal abdominal aortogram was performed  using 20 mL of Omnipaque dye at 20 mL/sec.  There were two left renal  arteries, the superior of which had a 40% ostial stenosis and the inferior  of which, which was smaller, had a 50% ostial stenosis.   IMPRESSION:  Mr. Wadlow has widely patent grafts with moderate left  ventricular dysfunction.  I do not know the etiology of his Cardiolite  abnormality, but I suspect his symptoms are noncardiac.  ACT was measured  and the sheaths were removed.  Pressure was applied to the groin to achieve  hemostasis.  The patient left the lab in stable condition.  Plans  will be to  restart heparin in four hours and to have pharmacy dose his Coumadin.  He is  currently in sinus rhythm, and he will be discharged home once his INR is  therapeutic.  He left the lab in stable condition.                                               Nanetta Batty, M.D.    JB/MEDQ  D:  09/16/2002  T:  09/17/2002  Job:  [id number]  Second Floor Cardiac Catheterization Lab   Soyla Murphy. Renne Crigler, M.D.  [address] [place] [address]  [place]  [place] [address]  Fax: [phone]   [facility]  [address]  [place], [place] [address]   cc:   Second Floor Cardiac Catheterization Lab   Soyla Murphy. Renne Crigler, M.D.  [address] [place] [address]  [place]  [place] [address]  Fax: [phone]   [facility]  [address]  [place], [place] [address]

## 2010-07-23 NOTE — Op Note (Signed)
   NAME:  CALOGERO, GEISEN                    ACCOUNT NO.:  [id number]   MEDICAL RECORD NO.:  [id number]                   PATIENT TYPE:  AMB   LOCATION:  ENDO                                 FACILITY[facility]  [facility]   PHYSICIAN:  Georgiana Spinner, M.D.                 DATE OF BIRTH:  [birth year]   DATE OF PROCEDURE:  07/17/2002  DATE OF DISCHARGE:                                 OPERATIVE REPORT   PROCEDURE:  Upper endoscopy.   INDICATIONS FOR PROCEDURE:  Abdominal pain, endoscopic Barrett's esophagus.   ANESTHESIA:  Demerol 60, Versed 5 mg.   DESCRIPTION OF PROCEDURE:  With the patient mildly sedated in the left  lateral decubitus position, the Olympus videoscopic endoscope was inserted  in the mouth and passed under direct vision through the esophagus which  appeared normal. We biopsied the distal esophagus GE junction area just to  check for possible Barrett's. We entered into the stomach. The fundus, body,  antrum, duodenal bulb and second portion of the duodenum all appeared  normal. From this point, the endoscope was then slowly withdrawn taking  circumferential views of the  duodenal mucosa until the endoscope was then  pulled back in the stomach, placed in retroflexion to view the stomach from  below. The endoscope was then straightened and withdrawn taking  circumferential views of the remaining gastric and esophageal mucosa. The  patient's vital signs and pulse oximeter remained stable. The patient  tolerated the procedure well without apparent complications.   FINDINGS:  Question of Barrett's esophagus biopsied, otherwise. unremarkable  exam.   PLAN:  Await biopsy report. The patient will call me for results and  followup with me as an outpatient. Proceed to colonoscopy as planned.                                               Georgiana Spinner, M.D.    GMO/MEDQ  D:  07/17/2002  T:  07/17/2002  Job:  [id number]

## 2010-07-23 NOTE — H&P (Signed)
NAME:  [name], [name] NO.:  [id number]   MEDICAL RECORD NO.:  [id number]          PATIENT TYPE:  INP   LOCATION:  1854                         FACILITY:  [facility]   PHYSICIAN:  Elliot Cousin, M.D.    DATE OF BIRTH:  Jul 26, 1940   DATE OF ADMISSION:  08/05/2005  DATE OF DISCHARGE:                                HISTORY & PHYSICAL   PRIMARY CARE PHYSICIAN:  Merri Brunette, M.D.   CHIEF COMPLAINT:  Low blood sugar of 22 at home. Blurred vision and  generalized weakness.   HISTORY OF PRESENT ILLNESS:  The patient is a 70 year old man with past  medical history significant for type 2 diabetes mellitus, coronary artery  disease, status post 3 vessel CABG in 2004, and COPD, who presents to the  emergency department with chief complaint of low blood sugar. The patient  states that he was unable to get out of his chair this morning without  assistance from his son. He was generally weak and had blurred vision. He  immediately checked his blood sugar and it was 22. The patient drank  multiple cups of orange juice and ate breakfast. His blood sugars increased  to a range of 30 to 46. He called his doctor's office and was advised to  come in. When he arrived to Dr. Carolee Rota office, his blood sugar was in the  30's and 40's. He was treated by Dr. Carolee Rota assistant, Arlys John, with multiple  cups of apple juice. However, his blood sugar did not increase enough for  him to go home. The patient was advised to come to the emergency department  for further evaluation. When the patient arrived to the emergency  department, his blood glucose was 50. He was started on treatment with D51/2  normal saline and multiple amps of D50. During his stay in the emergency  department, his blood sugars have only reached a high of 53. The patient  says that he feels better but still is a little shaky.  He is currently  alert and oriented and has no complaints of headache, blurred vision, chest  pain, or  shortness of breath at this time. He does mention that his left  ankle is more swollen than his right ankle. No acute trauma or pain in his  calf.   Of note, the patient's diabetes medications were recently changed last week.  He had been chronically treated with Amaryl 4 mg a day and Avandia 4 mg a  day. However, last week his regimen changed to Actos 15 mg daily and Amaryl  4 mg daily. The patient will be admitted for further evaluation and  management.   PAST MEDICAL HISTORY:  THE PATIENT HAS AN EXTENSIVE PAST MEDICAL HISTORY,  HOWEVER, IN THE E-CHART SYSTEMS, NONE OF THE PATIENT'S PREVIOUS  HOSPITALIZATIONS ARE RECORDED.  1.  Type 2 diabetes mellitus, diagnosed in the early 1990's (recent change      in medications.)  2.  Coronary artery disease, status post CABG x3 in 2004.  3.  Peripheral vascular disease.  4.  Question history of an arrhythmia.  5.  Obstructive sleep apnea (patient admits  to being non-compliant with C-      PAP.)  6.  COPD.  7.  Neuropathic pain, primarily in his legs.  8.  Chronic low back pain with a history of lumbar surgery in the past.  9.  Umbilical hernia.  10. Obesity.  11. Tobacco abuse.  12. Chronic kidney disease.   MEDICATIONS:  1.  Actos 15 mg daily.  2.  Amaryl 4 mg daily.  3.  Vicodin 325/10 1 tablet every 6 hours.  4.  Lyrica 50 mg t.i.d.  5.  Lasix 20 mg b.i.d.  6.  Metoprolol 50 mg b.i.d.  7.  Amiodarone 200 mg daily.  8.  Multivitamin with iron once daily.  9.  Advair Diskus 250/50 1 puff b.i.d.  10. Ambien 10 mg q.h.s. p.r.n.  11. C-PAP (not using.)   ALLERGIES:  The patient has an intolerance to CYMBALTA.   SOCIAL HISTORY:  The patient is now single. [place] One of his sons lives with him. He smokes 3 packs of cigarettes  per day and has been doing so for approximately 20 years. He denies alcohol  use. He is disabled.   FAMILY HISTORY:  His father died of cancer (the patient could not  remember  the type of cancer.) He died at 73 years of age. His mother is still living.  She is 55 years of age. She has heart disease and neuropathy.   REVIEW OF SYSTEMS:  The patient review of systems is positive for chronic  wheezing, occasional shortness of breath, swelling in his legs, low back  pain, pain in his legs. Otherwise, review of systems is negative.   PHYSICAL EXAMINATION:  VITAL SIGNS:  Temperature 97.8, blood pressure  110/62, pulse 99, respiratory rate 14. Oxygen saturation 89% on room air,  improved to 92% on oxygen therapy.  GENERAL:  The patient is a pleasant, obese 70 year old Caucasian man who is  currently sitting up in bed, alert, and in no acute distress.  HEENT:  Head is normocephalic, non-traumatic. Pupils are equal, round, and  reactive to light. Extraocular movements are intact. Conjunctivae are clear.  Sclerae are white. Nasal mucosa is mildly dry. No sinus tenderness.  Oropharynx reveals mildly dry mucous membranes. No posterior exudates or  erythema.  NECK:  Obese, supple. No adenopathy, no thyromegaly, no bruit, no JVD.  LUNGS:  The patient has a few scattered wheezes and crackles throughout his  lung fields. Breathing is non-labored.  HEART:  S1 and S2 with a soft systolic murmur and bradycardia  ABDOMEN:  Obese. Positive bowel sounds. Soft, nontender, and nondistended.  No hepatosplenomegaly. No masses palpated. There is a reducible umbilical  hernia, nontender.  EXTREMITIES:  The patient's pedal pulses were not palpable. He has a trace  of edema of his right lower extremity and approximately 1 to 1 1/2 plus  edema of his left lower extremity. The patient has an excellent range of  motion  of his joints. No acute focal, hot joints. He does have a well  healed lumbar scar.  NEUROLOGIC:  The patient is alert and oriented x2. Cranial nerves 2-12 are  grossly intact.  LABORATORY DATA:  EKG reveals sinus bradycardia with a first degree AV block  with a  heart rate of 56 beats per minute. Low voltage QRS.   Chest x-ray reveals no acute cardiopulmonary disease.   Sodium 139, potassium 4.5, chloride 108, BUN 35, glucose 50, bicarbonate 26,  creatinine 2.5. White blood  cell count 7.1. Hemoglobin and hematocrit 13.7  and 40. MCV 99.7. Platelets 171,000.   ASSESSMENT:  1.  Profound hypoglycemia. The patient has received multiple ampules of D50,      yet his capillary blood sugars have barely increased to over 50. The      etiology of the hypoglycemia is obviously the oral hypoglycemic agents.      It is also important to note that the patient has chronic kidney disease      and with a creatinine of 2.5, the oral hypoglycemic agents are probably      not clearing as effectively as they have been in the past. The patient's      mental status has improved and he no longer has any complaints of      blurred vision and generalized weakness. However, because of the      persistent hypoglycemia, he warrants admission to the step-down unit.   1.  Chronic kidney disease. The patient's recent baseline creatinine is      unknown, however, during an ER visit in February 2006, his creatinine      was noted to be 1.2. It is 2.5 today. He is followed by nephrologist,      Dr. Caryn Section. The patient may have an element of acute renal insufficiency.   1.  Mild bradycardia. The patient's heart rate is currently 56. He is      chronically treated with metoprolol 50 mg b.i.d. The bradycardia is      probably secondary to the beta blocker.   1.  Left greater than right lower extremity edema. The patient has chronic      peripheral edema, however on examination, the edema is greater of the      left leg. Given the patient's comorbid conditions, ruling out a DVT is      warranted.   1.  Mild hypoxia with a history of COPD, obstructive sleep apnea, and      tobacco abuse. The patient's oxygen saturation is 89% on room air. More      than likely, the mild hypoxia is  secondary to his chronic obstructive      pulmonary disease and tobacco abuse. He does have a few wheezes on      examination.   1.  Coronary artery disease status post CABG x3. The patient says that his      heart disease has been stable. He is followed by cardiologist, Dr.      Tresa Endo.   PLAN:  1.  The patient will be admitted to a step-down bed.  2.  His capillary blood sugars will be monitored frequently, at least every      hour. He will be started on D10 1/2 normal saline at 100 cc an hour. The      D10 will be titrated accordingly, either up or down. He will be given      p.r.n. doses of D50 accordingly. Once his capillary blood sugars are      greater than 150 x3 consecutive hours, the IV fluid rate will be      decreased.  3.  Will obviously discontinue Amaryl and Actos during the hospital course.      If either one of these agents are restarted, a much smaller dose will be      administered. 4.  Neuro checks q.4 hours x24 hours.  5.  Decrease the dose of metoprolol to 12.5 mg b.i.d. as the patient's blood  pressure is low- normal and his heart rate is mildly bradycardiac. The      metoprolol may be titrated back up to his pre-hospital dose accordingly      and as needed.  6.  Will continue therapy with Advair. Will add albuterol and Atrovent      nebulizers q.6 hours.  7.  The patient will be counseled on tobacco cessation. He was advised to      stop smoking. Will add oxygen to keep      his oxygen saturations greater then or equal to 90%. Will place a      nicotine patch.  8.  Will check a venous Doppler study of his left leg to rule out DVT.  9.  Will check additional labs in the morning including a C-met, Cortisol      level, TSH, and hemoglobin A1C.      Elliot Cousin, M.D.  Electronically Signed     DF/MEDQ  D:  08/05/2005  T:  08/05/2005  Job:  [id number]   cc:   Soyla Murphy. Renne Crigler, M.D.  Fax: [phone]   Nicki Guadalajara, M.D.  Fax: [phone]   Wilber Bihari.  Caryn Section, M.D.  Fax: [phone]

## 2010-07-23 NOTE — Discharge Summary (Signed)
NAME:  [name], [name]                    ACCOUNT NO.:  [id number]   MEDICAL RECORD NO.:  [id number]                   PATIENT TYPE:  INP   LOCATION:  4708                                 FACILITY:  [facility]   PHYSICIAN:  Nicki Guadalajara, M.D.                  DATE OF BIRTH:  [birth year]   DATE OF ADMISSION:  09/10/2002  DATE OF DISCHARGE:  09/18/2002                                 DISCHARGE SUMMARY   DISCHARGE DIAGNOSES:  1. Unstable angina.  2. Coronary artery disease.     a. History of coronary artery bypass grafting.     b. Patent grafts with unprotected diagonal lesion, medical therapy.  3. Left ventricular dysfunction, ejection fraction 40%.  4. Mild congestive heart failure, acute-on-chronic systolic heart failure.  5. Paroxysmal atrial fibrillation.  6. Chronic obstructive pulmonary disease.  7. Anticoagulation.  8. Diabetes mellitus type 2.  9. Peripheral neuropathy.   DISCHARGE MEDICATIONS:  1. Lovenox 90 mg subcu q.12h. at 11 a.m. and 11 p.m.  2. Coumadin 5 mg Wednesday, Thursday, Friday, and then wait for     instructions.  3. Lopressor 25 mg which is half of a 50 mg tablet twice a day.  4. Glucophage 500 mg twice a day - begin September 19, 2002.  5. Avapro 150 mg daily.  6. Neurontin 800 mg three times a day.  7. Amiodarone 200 mg daily.  8. Niacin 500 mg every evening.  9. Aspirin 81 mg daily.  10.      Glipizide ER 10 mg daily.  11.      Zocor 40 mg daily.  12.      Inhaler as before.   DISCHARGE INSTRUCTIONS:  1. Activity:  No strenuous activity, no lifting over 10 pounds for two days,     then resume regular activities.  2. Low fat, low salt diabetic diet.  3. Have protime done Friday morning early morning and call our office by 4     p.m. if you have not heard from [place].  4. Follow up with Dr. Landry Dyke P.A., [facility], September 30, 2002 at 11 a.m.   HISTORY OF PRESENT ILLNESS:  A 69 year old white married male patient of Dr.  Renne Crigler and Dr. Tresa Endo with one- to  two-week history of exertional fatigue and  dyspnea.  Also a sore spot to the right of central chest.  He came to the  emergency room for further evaluation on September 10, 2002 and was admitted by  Dr. Elsie Lincoln on call for Dr. Tresa Endo.   PAST MEDICAL HISTORY:  History of bypass grafting in October 2003 and back  surgery in 2003.  History of type 2 diabetes mellitus on oral agents.  He  has had a bronchitic-type cough, had stopped albuterol several months ago.  Also, history of hypertension, paroxysmal atrial fibrillation on Coumadin,  and COPD; stopped tobacco two years ago.   FAMILY HISTORY, SOCIAL HISTORY, REVIEW OF  SYSTEMS:  See H&P.   PHYSICAL EXAMINATION AT DISCHARGE:  VITAL SIGNS:  Blood pressure 131/70,  respirations 18, pulse 58, afebrile, room air oxygen saturation 97%.  GENERAL:  Alert and oriented male patient without complaints.  LUNGS:  Clear to auscultation bilaterally.  HEART:  Regular rate and rhythm.  EXTREMITIES:  Without edema.  He did not have any bruising or hematoma at  the catheterization site.   LABORATORY DATA:  1. Hemoglobin on admission 13.6, hematocrit 40.4, wbc 9.5, platelets 259;     these remained stable.  Protime 25.8, INR 2.8, PTT 53.  D-dimer had been     0.27.  2. Coags were normal.  He was placed on heparin with an INR down to 1.5 and     prior to discharge he was back on Coumadin; INR at discharge was 1.7 on     Lovenox.  3. Chemistry:  Sodium 136, potassium 4.6, chloride 103, CO2 25, glucose 177,     BUN 13, creatinine 1.0, calcium 8.7, total protein 6.2, albumin 3.1, AST     24, ALT 29, ALP 82, total bili 0.7.  Glucose was anywhere from 69 to 177     throughout hospitalization.  4. Cardiac enzymes:  CK 90, 83, and 80; MB 5.0, 4.1, 3.0; troponin I was all     negative at 0.01 to 0.02.  5. BNP was 510.  6. Cholesterol 143, triglycerides 133, HDL 27, LDL 89.  7. TSH 0.969.  8. Urinalysis:  Hemoglobin trace.  9. Chest x-ray on admission:  Heart  enlarged with mild congestive heart     failure, no major atelectasis or pneumonia, old left rib fractures, prior     CABG.  Interval increase in heart size with mild congestive heart     failure.  10.      Nuclear medicine stress test on September 13, 2002:  Partially reversible     area of decreased myocardial perfusion involving the apex of the left     ventricle, moderately decreased LV EF 39%, mildly dilated left     ventricular wall motion abnormalities.  11.      Chest x-ray prior to discharge:  Cardiomegaly, COPD, no acute     disease.  Both lungs were clear.  12.      A 2-D echocardiogram done September 10, 2002:  LV was mildly dilated,     overall left ventricular function lower limits of normal, EF 50-55%,     aortic valve thickness mildly increased, mild calcification of the mitral     valve involving the posterior leaflet, mild mitral annular calcification.     The left atrium was markedly dilated, right ventricular size upper limits     of normal.  13.      Cardiac catheterization:  Vein grafts were widely patent.  The LAD     did have a large diagonal branch which arose in the proximal third which     was not bypassed.  EF was 40%.  There were two left renal arteries; the     superior had 40% ostial stenosis and the inferior had a 50% ostial     stenosis.   Dr. Allyson Sabal was unsure of the etiology of his Cardiolite abnormality.  He will  be treated with medications.   HOSPITAL COURSE:  [name] was admitted by Dr. Elsie Lincoln for Dr. Tresa Endo  on September 10, 2002 after experiencing chest pain.  He was on Coumadin for  paroxysmal atrial fibrillation.  He had developed chest pain, mild  substernal ache, and would increase with a deep breath.  Could not lie down  secondary to increased shortness of breath and slept in recliner for three  nights prior to admission.  On September 10, 2002 he was admitted, given IV diuretics.  For pleuritic chest pain, Xopenex was added to medical therapy.  A 2-D  echocardiogram was ordered and Cardiolite study was planned.   The patient underwent Cardiolite study with results as above.  Plans were  made for cardiac catheterization; Coumadin was held.  Once INR was  therapeutic the patient underwent cardiac catheterization secondary to  abnormal Cardiolite.  Post procedure the patient did well.  Coumadin was  restarted, awaiting therapeutic INR prior to discharge.  No further  complaints.  By September 18, 2002 he was ready for discharge.  He was seen by  Dr. Jenne Campus and discharged for Dr. Tresa Endo, and would follow up as an  outpatient.      Darcella Gasman. [place], N.P.                     Nicki Guadalajara, M.D.    LRI/MEDQ  D:  10/22/2002  T:  10/23/2002  Job:  [id number]   cc:   Soyla Murphy. Renne Crigler, M.D.  [address]  [place]  [place] [address]  Fax: [phone]

## 2010-07-23 NOTE — H&P (Signed)
NAME:  [name], [name] NO.:  [id number]   MEDICAL RECORD NO.:  [id number]          PATIENT TYPE:  EMS   LOCATION:  MAJO                         FACILITY:  [facility]   PHYSICIAN:  Marcellus Scott, MD     DATE OF BIRTH:  [birth year]   DATE OF ADMISSION:  02/23/2006  DATE OF DISCHARGE:                              HISTORY & PHYSICAL   CHIEF COMPLAINT:  Productive cough, fever and left leg pain.   HISTORY OF PRESENTING COMPLAINT:  [name] is a 70 year old  pleasant Caucasian male patient who has an extensive past medical  history as indicated below.  He was following up with his pain doctor  today for adjustment of his pain medications when he was noted to have  poor oxygen saturation as well as abnormal lung findings on physical  exam, following which he was advised to come to the emergency room for  further evaluation and management.  The patient says he has been having  a cough productive of green colored sputum for a couple of days with low  grade temperatures of 100 degress Farenheit.  He denies any chest pain  or dyspnea.  He does have some wheezing at times.   The patient also fell approximately a week ago sustaining an injury to  his left foot.  Since then, he had excruciating pain in that leg and  foot which has gradually decreased, but still has some amount of pain  with difficulty with weight-bearing on that leg.  The leg also is mildly  swollen.  The patient has not had any x-rays done on that leg.   PAST MEDICAL HISTORY:  1. Asthma/COPD.  2. Coronary artery disease status post CABG in 2003.  3. Type 2 diabetes.  4. Dyslipidemia.  5. Hypertension.  6. Chronic kidney disease.  7. Severe peripheral vascular disease with claudication pain in the      lower extremities.  8. Paroxysmal atrial fibrillation.  9. The patient had been having severe pain in both lower extremities      about three weeks ago when he was on high doses of hydrocodone  which was not apparently working.  He was seen by a pain doctor      following which he was switched to Methadone, however, the      Methadone had a change in his mental status with the patient      becoming extremely sleepy, lethargic at times, and unsteadiness of      gait, so he was reassessed at the end of last week when Methadone      was stopped and the patient was started on morphine sulfate      immediate relief.  However, the patient continued to, again, have      the same symptoms of lethargy, intermittent change of mental      status, and difficulty walking.  Hence, he went to the pain doctor      today for adjustment of medications when he was assessed and      advised to come to the emergency room for evaluation for his lung  problems.  10.Congestive heart failure.  11.Renal artery stenosis.   PAST SURGICAL HISTORY:  1. Coronary artery bypass graft.  2. Back surgery.   MEDICATION HISTORY:  1. Amiodarone 200 mg daily.  2. Lasix 20 mg b.i.d.  3. Lyrica 50 mg t.i.d.  4. Metoprolol 12.5 mg b.i.d.  5. Avapro 150 mg p.o. daily.  6. Morphine sulfate immediate relief 15 mg t.i.d.  7. Crestor 10 mg p.o. daily.  8. Metanx one p.o. daily  9. Remeron 30 mg p.o. daily.  10.Advair 250/50, 2 puffs twice daily.  11.Ambien nightly.  12.Promethazine 25 mg one tablet 4-6 hourly as needed.  13.Albuterol 2 puffs 4-6 hourly as needed.  14.Mucinex one a day.  15.Omega-3 fish oil 1000 mg one tablet two times a day.  16.Multi-vitamin one tablet daily.  17.Prilosec one tablet a day as needed.   ALLERGIES:  CYMBALTA which seems to cause some seizure like symptoms.   FAMILY HISTORY:  The patient's mother is 58 years old and is a survivor  of breast cancer.  Father deceased of some type of cancer in the 79s.   SOCIAL HISTORY:  The patient has a 40-pack-year smoking history,  currently smokes two packs per day.  He is a recovering alcoholic, quit  four years ago following his CABG.   No history of any other recreational  drug use.   ADVANCED DIRECTIVES:  The patient is a full code.   REVIEW OF SYSTEMS:  HEENT: The patient had occasional headaches but no  visual disturbances, ear aches, or sore throat.  The patient has a  history of some chronic dysphagia which is relieved by elevating his  chin and taking a drink of water.  GENERAL:  Patient with low grade  fever, but no chills or rigors.  Generalized weakness.  RESPIRATORY  SYSTEM:  Per history of presenting illness.  CARDIOVASCULAR SYSTEM:  No  chest pain, palpitations, orthopnea, PND.  ABDOMINAL SYSTEM:  Patient  with nausea.  Had intermittent vomiting last week but no more.  Has a  decreased appetite.  Patient with some intermittent lower abdominal pain  but no diarrhea.  History of constipation.  GENITOURINARY:  No dysuria,  frequency, urgency.  CNS:  Patient without asymmetrical limb weakness,  numbness, or tingling.  No dysarthria, slurred speech, or twisting of  the mouth.  EXTREMITIES:  Patient with severe pain in the left lower  extremity following a fall, also, has chronic bilateral lower extremity  pain secondary to his peripheral vascular disease claudication as well  as peripheral neuropathy.  SKIN:  Without any rashes.  PSYCHIATRIC:  Noncontributory.   PHYSICAL EXAMINATION:  GENERAL:  [name] is a pleasant 65-year-  old moderately built, well nourished male patient in no obvious  cardiopulmonary distress.  VITAL SIGNS:  Temperature maximum in the ER is 100.4, pulse 60 per  minute and regular, respirations 18 per minute, blood pressure 114/60,  saturating 95% on room air.  HEENT:  The head is normocephalic and atraumatic.  Pupils are  symmetrical, equally reacting to light and accommodation.  Mucosa pink  and dry.  Anicteric.  Extraocular movements intact.  Oral cavity mucosa  is dry.  There is no pharyngeal erythema or thrush. NECK:  Without JVD, bruit, goiter, or lymphadenopathy.  LUNGS:   Slightly harsh breath sounds bilaterally with some crackles in  the left base but no rhonchi or wheezing. The patient has a scar of  sternotomy.  HEART:  The patient's first and second heart sounds  are heard, no third  or fourth heart sounds, no murmurs, gallops, rubs, or clicks.  ABDOMEN:  Obese, some minimal intermittent tenderness in the suprapubic  region but no rigidity, guarding, rebound, organomegaly, or mass.  Bowel  sounds are preserved.  CNS:  The patient is awake, alert and oriented x3, no cranial nerve  deficit.  EXTREMITIES:  Patient with diminished pulses in both posterior tibial as  well as dorsalis pedes.  The left leg is swollen below the mid leg with  some edema of the dorsum of the foot.  Some patchy redness along the  lateral aspect of the left foot which is warm and tender.  SKIN:  Without rashes.   LABORATORY DATA:  CBC with hemoglobin 13.8, hematocrit 40.3, WBC 7.8,  platelets 227.  His basic metabolic panel shows sodium 137, potassium  4.3, chloride 103, bicarb 28, BUN 47, creatinine 2.5, glucose 120,  calcium 8.7.  The patient's baseline creatinine seems to be in the 1.5  range earlier.   RADIOLOGY:  Chest x-ray reveals early patchy pneumonia in the left lower  lobe.  Cardiomegaly without CHF.   ASSESSMENT/PLAN:  1. Community acquired pneumonia.  We will admit the patient to the      hospital.  Will obtain blood  cultures and sputum cultures.  Will      place the patient on intravenous antibiotics including Ceftriaxone      and Zithromax.  Will also provide the patient with bronchodilator      nebs, Mucinex, and Robitussin for symptomatic relief as well as for      loosening of secretions.  Will also hydrate the patient      intravenously.  2. Left lower extremity pain with a history of trauma without any      obvious fractures.  Will obtain an x-ray of the left lower      extremity to rule out fractures and Doppler of the lower extremity      to rule out  DVT.  Will check D-dimer.  This may also be secondary      to cellulitis of the lower extremities and Ceftriaxone should cover      this and will place the patient on pain medication.  3. History of coronary artery disease status post CABG.  Will continue      the patient on Metoprolol 12.5 twice daily and Crestor.  Will add      low dose aspirin at 81 mg daily.  4. Paroxysmal atrial fibrillation.  He seems to be in sinus rhythm.      Will obtain an EKG and will continue the patient's amiodarone.      Patient not on anticoagulation, unclear reason - to be followed up      by primary physician.  5. Pain management secondary to peripheral vascular disease as well as      peripheral neuropathy, will continue the patient's Lyrica and add      Oxycodone and Dilaudid p.r.n. in the hospital.  6. For his renal insufficiency, which seems to be acute on chronic,     will hold his Lasix as well as Avapro, hydrate intravenously, and      follow up his basic metabolic panel.  7. For his dyslipidemia, will continue Crestor.  8. COPD, seems to be stable at this time, nebulizations p.r.n.  Also,      to continue Advair and antibiotic treatment as per number 1.  9. DVT prophylaxis, for now will place the  patient on Lovenox.  10.GI prophylaxis, will place the patient on Protonix.  11.Tobacco use: tobacco cessation councilling.      Marcellus Scott, MD  Electronically Signed     AH/MEDQ  D:  02/23/2006  T:  02/23/2006  Job:  [id number]   cc:   Soyla Murphy. Renne Crigler, M.D.  Richard F. Caryn Section, M.D.  Maretta Bees. Vonita Moss, M.D.  Nicki Guadalajara, M.D.  Kathrin Penner. Vear Clock, M.D.

## 2010-07-23 NOTE — Discharge Summary (Signed)
NAMEHAYDAN, [name] NO.:  [id number]   MEDICAL RECORD NO.:  [id number]          PATIENT TYPE:  INP   LOCATION:  4710                         FACILITY:  [facility]   PHYSICIAN:  Michaelyn Barter, M.D. DATE OF BIRTH:  1940/07/10   DATE OF ADMISSION:  02/23/2006  DATE OF DISCHARGE:  02/25/2006                               DISCHARGE SUMMARY   The patient's primary care physician is Dr. Merri Brunette.   FINAL DIAGNOSES:  1. Community acquired pneumonia.  2. Distal leg pain secondary to falling.  3. History of falling.  4. Renal insufficiency.   SECONDARY DIAGNOSES:  1. History of chronic obstructive pulmonary disease/asthma.  2. Chronic addiction to cigarettes.  The patient currently smokes two      packs of cigarettes per day.   HISTORY OF PRESENT ILLNESS:  [name] is a 70 year old gentleman  who had gone to see the pain physician on the date of his admission.  His oxygen saturations were noted to have been decreased during that  visit.  He complained of a productive cough consisting of a green sputum  accompanied by low-grade fevers.  He also had experienced some wheezing  occasionally.  He indicated that he had fell approximately one week  prior to this visit injuring his left foot and had been experiencing  pain in his leg and foot since that time.  For past medical history,  please see that dictated by Dr. Marcellus Scott on February 23, 2006.   HOSPITAL COURSE:  Problem 1:  COMMUNITY ACQUIRED PNEUMONIA:  The patient  had a low-grade fever of 100.4 during his visit in the emergency  department.  His oxygenation demonstrated a O2 sat of 95% on room air.  A chest x-ray was completed on February 23, 2006 which revealed the  heart to be moderately enlarged.  Pulmonary vascularity within normal  limits.  Patchy densities were present at the left lung base worrisome  for early airspace disease.  Bronchitic changes were also noted.  No  pneumothoraces or  effusions were seen.  The patient was started on  empiric IV antibiotics consisting of ceftriaxone 1 gram IV daily as well  as azithromycin 500 mg IV daily.  The patient indicated that his  breathing and his coughing improved over the course of his  hospitalization.  He remained afebrile throughout the hospital stay.  His white blood cell count also remained within normal limits over the  course of hospitalization.   Problem 2:  PAIN WITHIN THE LEFT LOWER EXTREMITY SECONDARY TO FALLING:  The patient had x-rays completed of his left foot on February 23, 2006.  A three-view, image was completed and revealed no acute findings of the  left ankle.  A three view x-ray was also completed and it revealed no  acute findings.  X-rays of the left tibia and fibula were completed  which revealed no acute findings with atherosclerosis noted.  Venous  Dopplers of the left lower extremities were completed on February 24, 2006.  They revealed no evidence of DVT, SVT or Baker's cyst.  The  patient has received p.r.n. pain medication over the  course of  hospitalization and his complaints of pain has been minimal.   Problem 3:  RENAL INSUFFICIENCY:  When the patient arrived, he was noted  to have had a BUN of 47 and creatinine of 2.5.  There may have been a  component of dehydration to this.  The patient has received aggressive  IV fluid hydration and over the past 3 days the patient experienced a  significant improvement in his BUN and creatinine.  As of today February 25, 2006, the patient's BUN is down to 29.  His creatinine is down to  1.7 and it appears that he is close to if not at his baseline at this  particular time.   Problem 4:  HISTORY OF CIGARETTE ABUSE:  The patient indicates that he  smokes up to two packs of cigarettes every day.  The patient has been  counseled with regards to the necessity of him stopping cigarettes.  The  patient states that he may consider it in the future but at  that this  particular time he does not appear to be interested in smoking  cessation.   CONDITION AT THE TIME OF DISCHARGE:  Today the patient indicates that he  feels better.  He denies having any shortness of breath and states that  his cough is improving.  He actually states that he wants to go home  today.  His vitals are temperature is 98.5, heart rate 66, respirations  20, blood pressure 128/74.  CBG 120.  O2 sat is 90% on room air.  The  patient currently shows no obvious distress and actually looks pretty  comfortable.  He indicates that he has been walking around his room  without any problems.  His last white blood cell count is 5.4,  hemoglobin 11.9, hematocrit 34.8, platelets 220.  His sodium is 136,  potassium 3.8, chloride 101, CO2 27, BUN 29, creatinine 1.7, glucose  101, calcium 8.0.  The decision has been made to discharge the patient  home.  The patient will be discharged home on the following medications:  1. Moxifloxacin 400 mg, the patient will be given an additional 8-day      supply that will give him a total of 11 days of antibiotics.  2. Mucinex 600 mg 1 tablet p.o. q.12h.  3. Amiodarone 200 mg 1 tablet p.o. daily.  4. Metoprolol 12.5 mg tablet p.o. b.i.d.  5. Combivent MDI 2 puffs q.i.d.   The patient will be told to continue his previously prescribed  medication.  Again, he has been encouraged to stop smoking cigarettes  but he does not appear to be interested in doing that at this particular  time.  He will be asked to follow-up with his primary care physician,  Dr. Merri Brunette within the next 2-4 weeks.      Michaelyn Barter, M.D.  Electronically Signed     OR/MEDQ  D:  02/25/2006  T:  02/26/2006  Job:  [id number]   cc:   Soyla Murphy. Renne Crigler, M.D.  Richard F. Caryn Section, M.D.  Maretta Bees. Vonita Moss, M.D.  Nicki Guadalajara, M.D.

## 2010-07-23 NOTE — Discharge Summary (Signed)
NAMEMarland [name]  [name], [name] NO.:  [id number]   MEDICAL RECORD NO.:  [id number]          PATIENT TYPE:  INP   LOCATION:  3735                         FACILITY:  [facility]   PHYSICIAN:  Isidor Holts, M.D.  DATE OF BIRTH:  August 26, 1940   DATE OF ADMISSION:  08/05/2005  DATE OF DISCHARGE:  08/07/2005                                 DISCHARGE SUMMARY   DISCHARGE DIAGNOSES:  1.  Profound and persistent hypoglycemia.  2.  Type 2 diabetes mellitus with neuropathy.  3.  Obesity.  4.  History of coronary artery disease, status post coronary artery bypass      grafting 2004.  5.  Chronic obstructive pulmonary disease, with obstructive sleep apnea      syndrome.  6.  Peripheral vascular disease.  7.  Chronic kidney disease.  8.  Smoking history.  9.  History of chronic low back pain, status post prior back surgery.  10. Bradycardia, secondary to beta blockade.   DISCHARGE MEDICATIONS:  1.  Lyrica 50 mg p.o. 3 times a day.  2.  Vicodin (10/325) 1 pill p.r.n. q. 6 h.  3.  Lasix 20 mg p.o. twice daily.  4.  Metoprolol 12.5 mg p.o. twice daily (was on 50 mg p.o. twice daily).  5.  Amiodarone 200 mg p.o. daily.  6.  Amaryl 2 mg p.o. daily from August 08, 2005.  (Was on 4 mg p.o. daily.)  7.  Multivitamins with iron 1 pill p.o. daily.  8.  Advair Diskus (250/50) 1 puff twice daily.  9.  Ambien 10 mg p.o. p.r.n. nightly.  10. Actos.  This has been discontinued, until reevaluated by primary MD, Dr.      Merri Brunette.   PROCEDURES:  1.  Chest x-ray dated August 05, 2005.  This showed cardiomegaly but no acute      disease.  2.  Bilateral lower extremity venous Dopplers dated August 07, 2005.  This      showed no evidence of DVT or baker's cyst.   CONSULTATIONS:  None.   ADMISSION HISTORY:  As in H&P notes of August 05, 2005, dictated by Dr. Elliot Cousin.  However, in brief, this is a 70 year old male, with known history  of type 2 diabetes mellitus, coronary artery disease, status post  CABG x3 in  2004, peripheral vascular disease, obstructive sleep apnea syndrome, COPD,  peripheral neuropathy, chronic low back pain, obesity, tobacco abuse, and  chronic kidney disease, who presents with generalized weakness, blurred  vision, and a blood glucose level of 22, documented on home blood glucose  testing.  The patient on discovering this low blood sugar, drank some orange  juice, called his primary MD's office, when he found that his blood glucose  had only increased to about 46 mg/dl.  On arrival at his primary MD's  office, he was treated with multiple cups of apple juice, without  significant glycemic response.  He was, therefore, brought to the emergency  department, where he was found to have a blood glucose of 50.  He was  administered multiple doses of intravenous D50W and started on 10% dextrose  effusion.  He was admitted for further evaluation, investigation, and  management.   CLINICAL COURSE:  #1.  PROFOUND AND PERSISTENT HYPOGLYCEMIA:  History is somewhat murky.  However, it is mentioned that patient may have undergone a recent change in  hypoglycemic medication.  At the time of presentation, he was on a  combination of Amaryl 4 mg and Actos 15 mg daily.  The patient was managed  with intravenous infusion of D10W, in addition to multiple intravenous doses  of D50W, and a single intramuscular injection of glucagon, resulting in  correction of his hypoglycemia.  his CBGs where monitored initially hourly,  then 2 hourly, and subsequently 4 hourly CBG checks and no further episodes  of hypoglycemia were documented.  By August 07, 2005, the patient had become  mildly to moderately hyperglycemic, was alert, wanting to go home, and was  eating normally.  D10W infusion was, therefore, discontinued on a.m. of August 07, 2005.  CBGs were monitored q. 4 h. and remained mildly hyperglycemic.  As  a matter of fact, final CBG done in the p.m. of August 07, 2005, was 152.   #2.   CHRONIC OBSTRUCTIVE PULMONARY DISEASE AND OBSTRUCTIVE SLEEP APNEA: The  patient's respiratory status was stable throughout the course of his  hospital stay.  Certainly, there were no symptoms, referable to respiratory.  It is noted that chest x-ray dated August 05, 2005, showed no evidence of acute  disease.   #3.  CORONARY ARTERY DISEASE:  The patient remained asymptomatic from this  viewpoint.   #4.  SINUS BRADYCARDIA:  At the time of presentation, the patient was noted  to have a mild sinus bradycardia of 56.  His beta blocker dosage has,  therefore, been reduced, with satisfactory response in pulse rate.  As of  August 07, 2005, pulse rate is hovering around 60 beats per minute.  Certainly,  the patient has no complaints of dizziness or chest pain.   #5.  RENAL INSUFFICIENCY:  The patient has a background history of chronic  kidney disease.  At the time of presentation, his BUN was 36 with a  creatinine of 2.5.  With intravenous fluid hydration he was placed on, as of  August 07, 2005, his BUN was much improved at 20 with a creatinine of 1.5.   #6.  SMOKING HISTORY:  The patient has been counseled appropriately, but has  declined Nicoderm CQ patch.   #7.  LOWER EXTREMITY EDEMA:  The patient was noted to have asymmetrical  lower extremity edema, left more than right, raising the suspicion of  possible deep vein thrombosis.  Fortunately, however, venous Doppler dated  August 07, 2005, was negative for DVT or baker's cyst.   DISPOSITION:  The patient was considered clinically stable for discharge on  August 07, 2005.   DIET:  Carbohydrate-modified, healthy heart diet.   ACTIVITY:  As tolerated.   WOUND CARE:  Not applicable.   PAIN MANAGEMENT:  Refer to Discharge Medication list.   FOLLOW UP:  The patient is instructed to follow up with his primary MD, Dr.  Merri Brunette, within 1 week.  He is to call for an appointment.  This has been communicated to him, and he has verbalized  understanding.      Isidor Holts, M.D.  Electronically Signed     CO/MEDQ  D:  08/07/2005  T:  08/07/2005  Job:  [id number]   cc:   Soyla Murphy. Renne Crigler, M.D.  Fax: [phone]

## 2010-07-23 NOTE — Op Note (Signed)
NAME:  [name], [name]                    ACCOUNT NO.:  [id number]   MEDICAL RECORD NO.:  [id number]                   PATIENT TYPE:  AMB   LOCATION:  ENDO                                 FACILITY:  [facility]   PHYSICIAN:  Georgiana Spinner, M.D.                 DATE OF BIRTH:  [birth year]   DATE OF PROCEDURE:  10/06/2003  DATE OF DISCHARGE:                                 OPERATIVE REPORT   PROCEDURE:  Upper endoscopy with biopsy.   INDICATIONS:  GERD with known Barrett's esophagus.   ANESTHESIA:  1. Demerol 40.  2. Versed 4 mg.   PROCEDURE:  With the patient mildly sedated in the left lateral decubitus  position, the Olympus videoscopic endoscope was inserted in the mouth,  passed under direct vision through the esophagus where a clear area of  Barrett's was seen, photographed.  We entered into the stomach.  Fundus,  body, antrum, duodenal bulb, second portion of duodenum were visualized.  Representative photographs were taken.  From this point, the endoscope was  slowly withdrawn, taking circumferential views of the duodenal mucosa until  the endoscope had been pulled back into the stomach and then placed in  retroflexion to view the stomach from below.  The endoscope was then  straightened and withdrawn, taking circumferential views of the remaining  gastric and esophageal mucosa, stopping in the antrum which appeared  erythematous, and at this time biopsy of the antral erythematous areas and  next we stopped at the distal esophagus with the previously noted Barrett's  changes were once again noted, photographed and biopsied.  Patient's vital  signs and pulse oximeter remained stable.  Patient tolerated the procedure  well without apparent complication.   FINDINGS:  Erythema of antrum biopsied, Barrett's esophagus biopsied; await  biopsy report.  Patient will call me for results and followup with me as an  outpatient.                                               Georgiana Spinner, M.D.    GMO/MEDQ  D:  10/06/2003  T:  10/06/2003  Job:  [id number]

## 2010-07-23 NOTE — Op Note (Signed)
NAME:  YOUNG, MULVEY NO.:  [id number]   MEDICAL RECORD NO.:  [id number]          PATIENT TYPE:  AMB   LOCATION:  ENDO                         FACILITY:  [facility]   PHYSICIAN:  Georgiana Spinner, M.D.    DATE OF BIRTH:  07/19/1940   DATE OF PROCEDURE:  11/18/2004  DATE OF DISCHARGE:                                 OPERATIVE REPORT   PROCEDURE:  Colonoscopy.   INDICATIONS:  Colon polyps.   ANESTHESIA:  Fentanyl 25 mcg.   PROCEDURE:  With the patient mildly sedated in the left lateral decubitus  position, the Olympus videoscopic colonoscope was inserted in the rectum and  passed under direct vision to the cecum identified by ileocecal valve and  appendiceal orifice both which were photographed. The prep was suboptimal in  that there was solid stool mixed with some liquid thick yellow stool seen in  the colon in various spots, but from this point the colonoscope was slowly  withdrawn taking circumferential views of colonic mucosa stopping only in  the rectum which appeared normal on direct and showed hemorrhoids on  retroflexed view the endoscope straightened, withdrawn. The patient's vital  signs pulse oximeter remained stable. The patient tolerated procedure well  without apparent complications.   FINDINGS:  Suboptimal prep in that there was areas that could not be well-  visualized despite suctioning and cleaning due to the prep.  Internal  hemorrhoids.  Otherwise unremarkable examination.   PLAN:  Repeat examination in five years           ______________________________  Georgiana Spinner, M.D.     GMO/MEDQ  D:  11/18/2004  T:  11/18/2004  Job:  [id number]

## 2010-07-23 NOTE — Discharge Summary (Signed)
NAME:  [name], [name] NO.:  [id number]   MEDICAL RECORD NO.:  [id number]                   PATIENT TYPE:  INP   LOCATION:  2029                                 FACILITY:  [facility]   PHYSICIAN:  Nicki Guadalajara, M.D.                  DATE OF BIRTH:  1941-01-04   DATE OF ADMISSION:  04/10/2002  DATE OF DISCHARGE:  04/13/2002                                 DISCHARGE SUMMARY   The patient is a 70 year old white married male with a history of  hypertension, hyperlipidemia, diabetes, tobacco use and alcohol use.  He was  scheduled for back surgery 12/26/01.  He underwent preop Cardiolite 11/02/01  with questionable ischemia.  However, back surgery was done without any  other cardiac consultation.  He apparently then developed atrial flutter and  ruled in for non-Q myocardial infarction.  He then underwent catheterization  and had three-vessel disease and underwent coronary artery bypass graft.  He  had postoperative atrial flutter or fibrillation also and he was put on  amiodarone and not any Coumadin.  He maintained sinus rhythm and his  amiodarone was discontinued 03/26/02.  He came back to the hospital on 04/10/02  with atrial fib/flutter and rapid ventricular response.  He was seen by Dr.  Nicki Guadalajara who is his primary cardiologist in the emergency room.  He  decided to put him back on amiodarone and Coumadin and he would undergo  early cardioversion since his atrial fibrillation was only eight hours in  duration. However, Dr. Tresa Endo wanted a TE cardioversion and it could not be  scheduled until 04/15/02.  Thus, it was decided that he should be sent home on  Lovenox.  He will continue amiodarone loading with 400 b.i.d. He would go  home on Lovenox 100 subcu every 12 hours for Saturday and Sunday, check a  protime on  Monday and decide if he needed  any further Lovenox therapy.  He  would also need to be seen to have an EKG done during the week to assess his  QT  interval.   It was decided that he will go home and come back for DC cardioversion at  some other point.  On 04/13/02, his blood pressure was 138/72, his heart rate  was high of 99.  During the night, his O2 saturations were 95% on room air.  Respirations were 20.  He was in atrial flutter.  On 04/10/02, he received 5  mg of Coumadin, 2/5 7.5, 2/6 7.5.  His INR on 2/7 was 1.4.   LABORATORY DATA:  Hemoglobin 13.2, hematocrit 39, platelets 309.  WBCs were  9.4.  Glucose 123, BUN 16, creatinine 0.8, albumin 3.2.  His total  cholesterol was 142, his HDL was 28, his LDL was 91 and triglycerides were  116.  TSH was 0.991.  Chest x-ray showed cardiomegaly.  No active lung  disease.   DISCHARGE MEDICATIONS:  1. Zocor  40 mg every day.  2. Prevacid as before.  3. Wellbutrin 150 mg every day.  4. Pletal 100 mg every day.  5. Neurontin 800 mg two times a day.  6. Glucotrol XL 10 mg every day.  7. Glucophage 500 mg two tabs twice a day.  8. Amiodarone 200 mg two tabs per day.  9. Lopressor 50 mg one tab twice a day.  10.      Avapro 75 mg every day.  11.      Lovenox 100 mg subcu every 12 hours.  12.      Coumadin 5 mg tablets.  Take two tablets Saturday and Sunday.  13.      Ativan 0.5 mg one four times per day.  He was advised to take one     twice a day for now and p.r.n.  if needed anymore. Given 50 pills, no     refills.  14.      Restoril 15 mg at bedtime.  He was given prescription for 30 pills,     no refills.  15.      Nicotine inhaler if needed.  He was given a prescription. It was     described to him how to take the medication.  He was to be seen by the     smoking nurse consultant at [facility] who is to call him at home in     about a week.   He should do no strenuous activity. No use of or heavy equipment.  He should  be on a low salt, low fat diet.  He should have his blood work done on  Monday.  He was given a lab slip.  We will call on him for further  notification if he  should continue Lovenox and how much Coumadin to take.  He should follow up with Dr. Tresa Endo at the office or call him on Monday.  He  should see him during the week.  If he is unable to see Dr. Tresa Endo because of  scheduling, he will need to see a PA and have an EKG done.  He was given a  prescription for amiodarone 200 mg two tabs twice per day for one week and  then he was given a prescription for amiodarone 200 mg two tabs every day  for two months.     Lezlie Octave, N.P.                        Nicki Guadalajara, M.D.    BB/MEDQ  D:  04/13/2002  T:  04/13/2002  Job:  [id number]   cc:   Arlys John A. Jeannine Kitten, M.D.  [address]  [place]  [place] [address]  Fax: [phone]   Cristi Loron, M.D.  [address]  [place]  [place] [address]  Fax: [phone]

## 2010-09-14 ENCOUNTER — Emergency Department (HOSPITAL_COMMUNITY): Payer: Medicare Other

## 2010-09-14 ENCOUNTER — Inpatient Hospital Stay (HOSPITAL_COMMUNITY)
Admission: EM | Admit: 2010-09-14 | Discharge: 2010-09-16 | DRG: 316 | Disposition: A | Discharge status: Home / Self Care | Payer: Medicare Other | Source: Ambulatory Visit | Attending: Internal Medicine | Admitting: Internal Medicine

## 2010-09-14 DIAGNOSIS — D649 Anemia, unspecified: Secondary | ICD-10-CM | POA: Diagnosis present

## 2010-09-14 DIAGNOSIS — J45909 Unspecified asthma, uncomplicated: Secondary | ICD-10-CM | POA: Diagnosis present

## 2010-09-14 DIAGNOSIS — Z87891 Personal history of nicotine dependence: Secondary | ICD-10-CM

## 2010-09-14 DIAGNOSIS — F3289 Other specified depressive episodes: Secondary | ICD-10-CM | POA: Diagnosis present

## 2010-09-14 DIAGNOSIS — F329 Major depressive disorder, single episode, unspecified: Secondary | ICD-10-CM | POA: Diagnosis present

## 2010-09-14 DIAGNOSIS — E7800 Pure hypercholesterolemia, unspecified: Secondary | ICD-10-CM | POA: Diagnosis present

## 2010-09-14 DIAGNOSIS — I4891 Unspecified atrial fibrillation: Secondary | ICD-10-CM | POA: Diagnosis present

## 2010-09-14 DIAGNOSIS — E119 Type 2 diabetes mellitus without complications: Secondary | ICD-10-CM | POA: Diagnosis present

## 2010-09-14 DIAGNOSIS — Z951 Presence of aortocoronary bypass graft: Secondary | ICD-10-CM

## 2010-09-14 DIAGNOSIS — R5381 Other malaise: Secondary | ICD-10-CM | POA: Diagnosis present

## 2010-09-14 DIAGNOSIS — I10 Essential (primary) hypertension: Secondary | ICD-10-CM | POA: Diagnosis present

## 2010-09-14 DIAGNOSIS — I498 Other specified cardiac arrhythmias: Secondary | ICD-10-CM | POA: Diagnosis present

## 2010-09-14 DIAGNOSIS — I2510 Atherosclerotic heart disease of native coronary artery without angina pectoris: Secondary | ICD-10-CM | POA: Diagnosis present

## 2010-09-14 DIAGNOSIS — I959 Hypotension, unspecified: Principal | ICD-10-CM | POA: Diagnosis present

## 2010-09-14 DIAGNOSIS — K219 Gastro-esophageal reflux disease without esophagitis: Secondary | ICD-10-CM | POA: Diagnosis present

## 2010-09-14 DIAGNOSIS — I509 Heart failure, unspecified: Secondary | ICD-10-CM | POA: Diagnosis present

## 2010-09-14 DIAGNOSIS — M810 Age-related osteoporosis without current pathological fracture: Secondary | ICD-10-CM | POA: Diagnosis present

## 2010-09-14 LAB — PROTIME-INR
INR: 1.1 (ref 0.00–1.49)
Prothrombin Time: 14.4 seconds (ref 11.6–15.2)

## 2010-09-14 LAB — CBC
HCT: 37.9 % — ABNORMAL LOW (ref 39.0–52.0)
MCH: 36.4 pg — ABNORMAL HIGH (ref 26.0–34.0)
MCV: 103.8 fL — ABNORMAL HIGH (ref 78.0–100.0)
RDW: 13.7 % (ref 11.5–15.5)
WBC: 7.1 10*3/uL (ref 4.0–10.5)

## 2010-09-14 LAB — DIFFERENTIAL
Eosinophils Relative: 3 % (ref 0–5)
Lymphocytes Relative: 34 % (ref 12–46)
Lymphs Abs: 2.4 10*3/uL (ref 0.7–4.0)
Monocytes Absolute: 0.5 10*3/uL (ref 0.1–1.0)
Monocytes Relative: 7 % (ref 3–12)

## 2010-09-14 LAB — APTT: aPTT: 33 seconds (ref 24–37)

## 2010-09-15 LAB — URINALYSIS, ROUTINE W REFLEX MICROSCOPIC
Ketones, ur: NEGATIVE mg/dL
Nitrite: NEGATIVE
Specific Gravity, Urine: 1.022 (ref 1.005–1.030)
Urobilinogen, UA: 0.2 mg/dL (ref 0.0–1.0)

## 2010-09-15 LAB — PROCALCITONIN: Procalcitonin: <0.1 ng/mL

## 2010-09-15 LAB — GLUCOSE, CAPILLARY: Glucose-Capillary: 95 mg/dL (ref 70–99)

## 2010-09-15 LAB — VITAMIN B12: Vitamin B-12: 760 pg/mL (ref 211–911)

## 2010-09-15 LAB — CARDIAC PANEL(CRET KIN+CKTOT+MB+TROPI)
CK, MB: 2.8 ng/mL (ref 0.3–4.0)
Relative Index: INVALID (ref 0.0–2.5)
Troponin I: <0.3 ng/mL (ref <0.30)

## 2010-09-15 LAB — TSH: TSH: 0.704 u[IU]/mL (ref 0.350–4.500)

## 2010-09-15 LAB — MRSA PCR SCREENING: MRSA by PCR: NEGATIVE

## 2010-09-15 LAB — CORTISOL: Cortisol, Plasma: 13.5 ug/dL

## 2010-09-15 LAB — COMPREHENSIVE METABOLIC PANEL
Alkaline Phosphatase: 68 U/L (ref 39–117)
BUN: 31 mg/dL — ABNORMAL HIGH (ref 6–23)
Creatinine, Ser: 1.75 mg/dL — ABNORMAL HIGH (ref 0.50–1.35)
GFR calc Af Amer: 47 mL/min — ABNORMAL LOW (ref >60)
Glucose, Bld: 70 mg/dL (ref 70–99)
Potassium: 4.4 mEq/L (ref 3.5–5.1)
Total Protein: 6.5 g/dL (ref 6.0–8.3)

## 2010-09-16 LAB — SEX HORMONE BINDING GLOBULIN: Sex Hormone Binding: 52 nmol/L (ref 13–71)

## 2010-09-16 LAB — GLUCOSE, CAPILLARY: Glucose-Capillary: 75 mg/dL (ref 70–99)

## 2010-09-16 NOTE — Discharge Summary (Signed)
NAMEBROGHAN, PANNONE NO.:  [id number]  MEDICAL RECORD NO.:  [id number]  LOCATION:  3710                         FACILITY:  [facility]  PHYSICIAN:  Jonny Ruiz, MD    DATE OF BIRTH:  Sep 11, 1940  DATE OF ADMISSION:  09/14/2010 DATE OF DISCHARGE:  09/16/2010                              DISCHARGE SUMMARY   DISCHARGE DIAGNOSIS:  Weakness secondary to hypotension and sinus bradycardia, iatrogenic.  SECONDARY DIAGNOSES: 1. Coronary artery disease. 2. Asthma. 3. Congestive heart failure. 4. Hypertension. 5. Anemia. 6. Atrial fibrillation, paroxysmal. 7. Bronchitis. 8. Diabetes mellitus type 2. 9. Gastroesophageal reflux disease. 10.Hypercholesterolemia. 11.Osteoporosis. 12.Pleural effusion. 13.Renal insufficiency. 14.Peripheral vascular disease. 15.Status post bilateral amputations, both feet. 16.Status post coronary artery bypass grafting.  DISCHARGE MEDICATIONS: 1. Acetaminophen 325 mg 2 tablets p.o. q.6 p.r.n. 2. Remeron 15 mg p.o. at bedtime. 3. Benicar 20 mg p.o. daily. 4. OxyIR 5 mg p.o. q.6 p.r.n. for pain. 5. MiraLax 17 g p.o. daily. 6. Pravastatin 40 mg once a day. 7. Ranexa 500 mg 2 tablets by mouth daily. 8. Amiodarone 100 mg p.o. daily. 9. Citalopram 40 mg once a day. 10.Vitamin B12 1 mg p.o. daily. 11.Fish oil 1200 mg b.i.d. 12.Flovent HFA 220 mcg 1 puff inhalation b.i.d. 13.Gabapentin 300 mg 3 times a day. 14.Niaspan SR 500 mg p.o. at bedtime. 15.Omeprazole 20 mg p.o. daily.  REASON FOR HOSPITALIZATION:  The patient is a 70 year old male with multiple medical problems, who was brought in to the hospital for severe weakness and he was found to be in bradycardia and hypotension.  For details of the history and physical, please refer to admission H and P.  HOSPITAL COURSE:  The patient was admitted to telemetry unit and his Bystolic, Benicar, and hydrochlorothiazide were discontinued.  The patient's evaluation included serial cardiac  enzymes and EKG as well as chest x-ray that showed low volume with bilateral bibasilar atelectasis. After discontinuation of the beta-blocker and ARB/diuretic combination, his blood pressure normalized as well as his heart rate.  Telemetry monitoring revealed sinus rhythm in the 50s and 60s with no alarms.  The patient recovered well and regained his strength back to baseline. While in the hospital, the patient developed pain and tingling in the feet, which were treated with OxyIR as well as acetaminophen with good response.  His cardiac enzymes were negative.  His repeated EKG this morning showed sinus rhythm at 60 beats per minute.  His TSH was normal. His electrolytes; BUN and creatinine showed mild renal insufficiency with the last creatinine of 1.75.  His BNP was 1250.  The patient reported no new complaints and today he felt really well enough to go home.  He is improved and he is stable to go home today.  LAB RESULTS AND DIAGNOSTICS:  Blood cultures negative x2 days.  Cardiac enzymes negative x2.  Vitamin B12 was 160.  TSH normal at 0.70. Testosterone low at 166.48.  Cortisol normal at 13.5.  Beta-natriuretic peptide 1250.  Urinalysis negative.  EKG as above.  Procalcitonin negative.  Lactic acid 0.9.  CBC normal.  Chest x-ray as above.  CONSULTATIONS:  None.          ______________________________ Jonny Ruiz, MD  GL/MEDQ  D:  09/16/2010  T:  09/16/2010  Job:  [id number]  cc:   Soyla Murphy. Renne Crigler, M.D.  Electronically Signed by Jonny Ruiz MD on 09/16/2010 04:57:20 PM

## 2010-09-21 LAB — CULTURE, BLOOD (ROUTINE X 2): Culture  Setup Time: 201207111354

## 2010-09-29 ENCOUNTER — Encounter: Payer: Self-pay | Admitting: Vascular Surgery

## 2010-10-02 NOTE — H&P (Signed)
NAME:  [name], DEMARTIN NO.:  [id number]  MEDICAL RECORD NO.:  [id number]  LOCATION:  [facility]                         FACILITY:  [facility]  PHYSICIAN:  Houston Siren, MD           DATE OF BIRTH:  03-02-1941  DATE OF ADMISSION:  09/14/2010 DATE OF DISCHARGE:                             HISTORY & PHYSICAL   PRIMARY CARE PHYSICIAN:  Dr. Awanda Mink.  CARDIOLOGIST:  [facility], Dr. Nicki Guadalajara.  NEPHROLOGY:  Dr. Marina Gravel.  ADVANCE DIRECTIVE:  Full code.  REASON FOR ADMISSION:  Weakness.  HISTORY OF PRESENT ILLNESS:  This is a 70 year old male with multiple medical problems including history of atrial fibrillation, coronary artery disease, asthma, diabetes, peripheral vascular disease status post bilateral leg amputations, chronic narcotic use, GERD, hypercholesterolemia and depression, presents to the emergency room mainly complained of weakness.  He also has some abdominal pain and stated he has been constipated.  He was found in the emergency room to be hypotensive with systolic blood pressure of 70 and relative bradycardia with heart rate in the mid 40s.  It should be noted that in September 2011, he presented with very similar symptoms and at that time with hypotension and bradycardia.  His blood pressure medication and beta blocker was discontinued.  Now, he is back on Benicar and Bystolic. He denies any chest pain, shortness of breath, nausea, vomiting, headache, black or bloody stool, and he has no diarrhea.  He does have elevation of his creatinine of 1.7, but he has a baseline chronic renal insufficiency.  Evaluation in emergency room also showed EKG with sinus bradycardia, but no acute ST-T changes, a BNP of 1250, and potassium of 4.4.  His urinalysis is negative and his chest x-ray only showed low lung volume, but otherwise no acute disease.  He has a normal lactic level and his hemoglobin is 13.3.  Hospitalist was asked to admit  him for further evaluation of his weakness and bradycardia.  PAST MEDICAL HISTORY: 1. Coronary artery disease. 2. Asthma. 3. Congestive heart failure. 4. Hypertension. 5. Anemia. 6. Atrial fibrillation. 7. Bronchitis. 8. Diabetes. 9. GERD. 10.Hypercholesterolemia. 11.Osteoporosis. 12.Pleural effusion. 13.Renal insufficiency. 14.Status post bypass surgery. 15.Peripheral vascular disease. 16.Status post bilateral amputation.  SOCIAL HISTORY:  He is a tobacco user and a former drinker.  He still drinks some and he denied any drug use.  ALLERGY:  IV DYE, IBUPROFEN, AND CYMBALTA.  MEDICATIONS:  Amiodarone, Benicar, Bystolic, citalopram, Niaspan, hydrocodone, iron tablet, mirtazapine, pravastatin, vitamin, Ranexa, Prilosec, and B12.  PHYSICAL EXAMINATION:  VITAL SIGNS:  Blood pressure 120/50, pulse of 45, respiratory rate 16, temperature 98.3. GENERAL:  He is alert and oriented and conversing.  He does appear to be fatigued and depressed.  Has facial symmetry and fluent speech.  Tongue is midline.  Uvula elevated with phonation. NECK:  Supple.  No lymphadenopathy or thyromegaly. CARDIAC:  Revealed S1-S2 regular. LUNGS:  Clear bilaterally. ABDOMEN:  Soft, nondistended, nontender.  No palpable mass. EXTREMITIES:  Show no edema.  He is status post bilateral amputation. SKIN:  Warm and dry. NEUROLOGIC:  Exam is otherwise unremarkable.  OBJECTIVE FINDINGS:  EKG, sinus bradycardia with first-degree AV block, creatinine 1.75,  glucose of 70, potassium 4.4.  Serum sodium 138.  Pro calcitonin less than 0.1.  Urinalysis is negative.  Troponin less than 0.3.  Lactic acid is 0.9.  INR of 1.1.  Chest x-ray showed low lung volume with bilateral basilar atelectasis.  White count of 7.1,000, hemoglobin of 13.3, MCV of 103.8.  IMPRESSION:  This is a 70 year old male with multiple medical problems, but the main ones are that he has history of atrial fibrillation, coronary artery disease,  diabetes and chronic narcotic use with history of depression presenting with weakness.  I think his weakness is rather multifactorial including excess medication causing relative bradycardia and hypotension.  For that, his beta blocker and ACE inhibitor should be stopped probably permanently.  If he should develop atrial fibrillation with rapid ventricular rate that he really should get a pacemaker, so that his negative chronotrope can be safely given.  His beta-blocker as noted has been discontinued in the past and this proved the point.  For now, I will continue his amiodarone.  The other reason that he may be weak is that he is very depressed.  He is on Celexa and this may need to be increased.  Also, his testosterone level I am certain will be low as he was a drinker and he is on chronic narcotic use.  I will check his testosterone level along with TSH and a vitamin B12.  It should be noted that he is having macrocytosis with MCV almost 104.  We will check a cortisol level as well.  He is a full code and will be admitted to telemetry.  We will assign him to [facility].  He is quite stable.  Hopefully, with this admission, his medication can be straightened out and some of the hormone level can be ascertained.    Houston Siren, MD    PL/MEDQ  D:  09/15/2010  T:  09/15/2010  Job:  [id number]  Electronically Signed by Houston Siren  on 10/02/2010 02:14:55 AM

## 2010-10-13 ENCOUNTER — Encounter: Payer: Self-pay | Admitting: Vascular Surgery

## 2010-10-13 ENCOUNTER — Ambulatory Visit (INDEPENDENT_AMBULATORY_CARE_PROVIDER_SITE_OTHER): Payer: Medicare Other | Admitting: Vascular Surgery

## 2010-10-13 VITALS — BP 111/57 | HR 52

## 2010-10-13 DIAGNOSIS — I6529 Occlusion and stenosis of unspecified carotid artery: Secondary | ICD-10-CM

## 2010-10-13 DIAGNOSIS — I739 Peripheral vascular disease, unspecified: Secondary | ICD-10-CM

## 2010-10-13 NOTE — Progress Notes (Signed)
Subjective:     Patient ID: [name], male   DOB: 09-12-1940, 70 y.o.   MRN: [id number]  HPI  This is a 70 year old gentleman who was referred for continued followup of his vascular disease. He had vascular studies done at Dr. Evlyn Clines office. The patient's daughter asked that we reviewed these records he comes in for a routine followup visit.  The patient denies any history of stroke, TIAs, expressive or receptive aphasia, or amaurosis fugax.  He has had no significant abdominal or back pain.he does state that he had been in the hospital recently because of low blood pressure.  The patient has had bilateral below-the-knee amputations.   History  Substance Use Topics  . Smoking status: Current Everyday Smoker -- 2.0 packs/day for 55 years    Types: Cigarettes  . Smokeless tobacco: Not on file  . Alcohol Use: No    Review of Systems  Constitutional: Negative for fever and chills.  Respiratory: Negative for chest tightness and shortness of breath.   Cardiovascular: Negative for chest pain and palpitations.       Objective:   Physical Exam  Constitutional: He is oriented to person, place, and time.  Neck: Neck supple. No JVD present. No thyromegaly present.  Cardiovascular: Normal rate, regular rhythm and normal heart sounds.  Exam reveals no friction rub.   No murmur heard. Pulmonary/Chest: Breath sounds normal. He has no wheezes. He has no rales.  Abdominal: Soft. Bowel sounds are normal. There is no tenderness.       No palpable aneurysm detected.  Musculoskeletal: He exhibits no edema.       Bilateral BKA's  Lymphadenopathy:    He has no cervical adenopathy.  Neurological: He is alert and oriented to person, place, and time. He has normal strength. No sensory deficit.  Skin: No lesion and no rash noted.   Filed Vitals:   10/13/10 1150  BP: 111/57  Pulse: 52    There is no height or weight on file to calculate BMI.   I have reviewed his carotid duplex  scan from Dr. Evlyn Clines office. He has evidence of an approximately 50% carotid stenosis bilaterally. This study was done on 04/20/2010.  I have also reviewed his duplex scan of his abdominal aorta which was also done on 04/22/2010. He had some slight dilatation of his abdominal aorta but did not appear to be aneurysmal. The maximum diameter was 2.74 cm.  I've also reviewed his Celine Ahr which was done in January of 2012. Showed no evidence of inducible left ventricular myocardial ischemia.      Assessment:    This patient has moderate bilateral carotid stenoses which are asymptomatic. He has routine carotid duplex scans done at Dr. Evlyn Clines office. I've explained that we would not consider carotid endarterectomy unless the stenosis progressed to greater than 80% or he developed new neurologic symptoms. We'll be happy to see him back any time if his stenoses progress.     Plan:     We'll see him back when necessary.

## 2010-12-01 LAB — BASIC METABOLIC PANEL
BUN: 24 — ABNORMAL HIGH
BUN: 18
CO2: 29
CO2: 27
CO2: 29
CO2: 31
Calcium: 8.9
Calcium: 8.9
Calcium: 8.6
Calcium: 8.8
Chloride: 97
Chloride: 102
Chloride: 104
Chloride: 105
Creatinine, Ser: 1.17
Creatinine, Ser: 1.35
GFR calc Af Amer: 51 — ABNORMAL LOW
GFR calc Af Amer: 54 — ABNORMAL LOW
GFR calc Af Amer: 56 — ABNORMAL LOW
GFR calc Af Amer: >60
GFR calc Af Amer: >60
GFR calc non Af Amer: 42 — ABNORMAL LOW
GFR calc non Af Amer: 45 — ABNORMAL LOW
GFR calc non Af Amer: 46 — ABNORMAL LOW
GFR calc non Af Amer: 58 — ABNORMAL LOW
Glucose, Bld: 112 — ABNORMAL HIGH
Glucose, Bld: 127 — ABNORMAL HIGH
Glucose, Bld: 161 — ABNORMAL HIGH
Potassium: 4.3
Potassium: 4.5
Potassium: 4.8
Potassium: 3.8
Potassium: 3.9
Potassium: 4.5
Sodium: 140
Sodium: 142
Sodium: 140
Sodium: 141

## 2010-12-01 LAB — CBC
HCT: 39.5
HCT: 45.1
HCT: 36.6 — ABNORMAL LOW
HCT: 38.6 — ABNORMAL LOW
Hemoglobin: 15.2
Hemoglobin: 12.7 — ABNORMAL LOW
Hemoglobin: 12.8 — ABNORMAL LOW
MCHC: 34
MCHC: 34.1
MCV: 98.6
MCV: 97.3
MCV: 97.8
Platelets: 117 — ABNORMAL LOW
RBC: 4.01 — ABNORMAL LOW
RBC: 4.57
RBC: 3.74 — ABNORMAL LOW
RBC: 3.97 — ABNORMAL LOW
RDW: 14.8
RDW: 14
WBC: 10.3
WBC: 10.8 — ABNORMAL HIGH
WBC: 8.4
WBC: 9.3

## 2010-12-01 LAB — PLATELET COUNT: Platelets: 119 — ABNORMAL LOW

## 2010-12-01 LAB — WOUND CULTURE: Gram Stain: NONE SEEN

## 2010-12-01 LAB — HEMOGLOBIN A1C: Mean Plasma Glucose: 147

## 2010-12-01 LAB — BLOOD GAS, ARTERIAL
Acid-Base Excess: 1.7
TCO2: 26.5
pCO2 arterial: 37.3
pH, Arterial: 7.448
pO2, Arterial: 81.1

## 2010-12-01 LAB — APTT: aPTT: 28

## 2010-12-01 LAB — PROTIME-INR
INR: 1.1
Prothrombin Time: 13.9

## 2010-12-07 LAB — URINALYSIS, ROUTINE W REFLEX MICROSCOPIC
Glucose, UA: NEGATIVE
Hgb urine dipstick: NEGATIVE
Protein, ur: NEGATIVE
Specific Gravity, Urine: 1.012
Urobilinogen, UA: 0.2

## 2010-12-07 LAB — COMPREHENSIVE METABOLIC PANEL
ALT: 17
AST: 23
Albumin: 3.6
Alkaline Phosphatase: 107
CO2: 29
Chloride: 106
GFR calc Af Amer: >60
GFR calc non Af Amer: 50 — ABNORMAL LOW
Potassium: 4.1
Sodium: 140
Total Bilirubin: 0.3

## 2010-12-07 LAB — DIFFERENTIAL
Basophils Absolute: 0
Basophils Relative: 1
Eosinophils Absolute: 0.3
Eosinophils Relative: 5
Lymphocytes Relative: 23
Monocytes Absolute: 0.6

## 2010-12-07 LAB — CBC
Platelets: 139 — ABNORMAL LOW
RBC: 4.4
WBC: 6.9

## 2010-12-16 LAB — COMPREHENSIVE METABOLIC PANEL
AST: 20
CO2: 29
Calcium: 9
Creatinine, Ser: 1.21
GFR calc Af Amer: >60
GFR calc non Af Amer: 60 — ABNORMAL LOW
Total Protein: 6.2

## 2010-12-16 LAB — CBC
Hemoglobin: 13.7
MCHC: 34.4
Platelets: 205
RDW: 14.2 — ABNORMAL HIGH

## 2010-12-16 LAB — C-REACTIVE PROTEIN: CRP: 1.4 — ABNORMAL HIGH (ref <0.6)

## 2010-12-17 LAB — COMPREHENSIVE METABOLIC PANEL
ALT: 14
ALT: 15
AST: 19
AST: 18
Albumin: 3.6
Albumin: 3.5
Alkaline Phosphatase: 78
Alkaline Phosphatase: 79
BUN: 15
BUN: 15
BUN: 14
CO2: 26
CO2: 28
CO2: 29
Calcium: 9.2
Calcium: 8.9
Calcium: 8.7
Calcium: 9.4
Chloride: 108
Chloride: 105
Creatinine, Ser: 1.47
Creatinine, Ser: 1.49
Creatinine, Ser: 1.23
Creatinine, Ser: 1.88 — ABNORMAL HIGH
GFR calc Af Amer: >60
GFR calc Af Amer: 44 — ABNORMAL LOW
GFR calc non Af Amer: 47 — ABNORMAL LOW
GFR calc non Af Amer: 36 — ABNORMAL LOW
GFR calc non Af Amer: 59 — ABNORMAL LOW
Glucose, Bld: 133 — ABNORMAL HIGH
Glucose, Bld: 119 — ABNORMAL HIGH
Potassium: 3.6
Potassium: 3.7
Sodium: 141
Sodium: 140
Total Bilirubin: 1
Total Protein: 6.9
Total Protein: 6.2
Total Protein: 6.4

## 2010-12-17 LAB — CBC
HCT: 39
HCT: 40.4
Hemoglobin: 13.7
Hemoglobin: 14.3
MCHC: 34.7
MCHC: 34.9
MCHC: 34.4
MCHC: 34.8
MCV: 101.3 — ABNORMAL HIGH
MCV: 101.7 — ABNORMAL HIGH
MCV: 100.1 — ABNORMAL HIGH
MCV: 100.7 — ABNORMAL HIGH
Platelets: 178
Platelets: 169
Platelets: 160
RBC: 3.98 — ABNORMAL LOW
RBC: 3.77 — ABNORMAL LOW
RBC: 4.13 — ABNORMAL LOW
RDW: 15.1 — ABNORMAL HIGH
RDW: 15.6 — ABNORMAL HIGH
WBC: 7.7
WBC: 8.6
WBC: 8.3

## 2010-12-17 LAB — B-NATRIURETIC PEPTIDE (CONVERTED LAB): Pro B Natriuretic peptide (BNP): 238 — ABNORMAL HIGH

## 2010-12-17 LAB — URINALYSIS, ROUTINE W REFLEX MICROSCOPIC
Bilirubin Urine: NEGATIVE
Nitrite: NEGATIVE
Specific Gravity, Urine: 1.011
Urobilinogen, UA: 0.2

## 2010-12-17 LAB — VANCOMYCIN, TROUGH
Vancomycin Tr: 15.1
Vancomycin Tr: 14
Vancomycin Tr: 30.6

## 2010-12-17 LAB — I-STAT 8, (EC8 V) (CONVERTED LAB)
Acid-Base Excess: 1
Bicarbonate: 26.1 — ABNORMAL HIGH
TCO2: 27
pCO2, Ven: 42.4 — ABNORMAL LOW
pH, Ven: 7.397 — ABNORMAL HIGH

## 2010-12-17 LAB — LIPID PANEL
Total CHOL/HDL Ratio: 3.9
Triglycerides: 89
VLDL: 18

## 2010-12-17 LAB — DIFFERENTIAL
Basophils Relative: 1
Eosinophils Absolute: 0.3
Neutrophils Relative %: 65

## 2010-12-17 LAB — CARDIAC PANEL(CRET KIN+CKTOT+MB+TROPI)
CK, MB: 1.6
Relative Index: INVALID
Total CK: 59
Troponin I: 0.03

## 2010-12-17 LAB — TSH: TSH: 0.801

## 2010-12-17 LAB — CK TOTAL AND CKMB (NOT AT ARMC): Total CK: 86

## 2010-12-17 LAB — POCT I-STAT CREATININE
Creatinine, Ser: 1.6 — ABNORMAL HIGH
Operator id: 282201

## 2010-12-17 LAB — POCT CARDIAC MARKERS
CKMB, poc: 1.2
Myoglobin, poc: 86.9
Operator id: 282201
Troponin i, poc: <0.05

## 2010-12-17 LAB — ETHANOL: Alcohol, Ethyl (B): <5

## 2010-12-17 LAB — HEMOGLOBIN A1C: Hgb A1c MFr Bld: 5.8

## 2010-12-20 LAB — CBC
MCHC: 35.3
MCV: 98.8
MCV: 101.4 — ABNORMAL HIGH
Platelets: 162
Platelets: 212
RBC: 3.88 — ABNORMAL LOW
RBC: 4.47
RDW: 15 — ABNORMAL HIGH
WBC: 9.9

## 2010-12-20 LAB — COMPREHENSIVE METABOLIC PANEL
AST: 19
CO2: 26
Calcium: 8.7
Creatinine, Ser: 1.61 — ABNORMAL HIGH
GFR calc Af Amer: 52 — ABNORMAL LOW
GFR calc non Af Amer: 43 — ABNORMAL LOW
Total Protein: 6

## 2010-12-20 LAB — WOUND CULTURE

## 2010-12-20 LAB — CREATININE, SERUM
Creatinine, Ser: 1.83 — ABNORMAL HIGH
GFR calc Af Amer: 45 — ABNORMAL LOW

## 2010-12-20 LAB — PROTIME-INR
INR: 1.1
Prothrombin Time: 13.9

## 2010-12-20 LAB — VANCOMYCIN, TROUGH: Vancomycin Tr: 17.5

## 2010-12-20 LAB — BUN: BUN: 45 — ABNORMAL HIGH

## 2011-06-23 ENCOUNTER — Encounter (HOSPITAL_COMMUNITY): Payer: Self-pay | Admitting: *Deleted

## 2011-06-23 ENCOUNTER — Inpatient Hospital Stay (HOSPITAL_COMMUNITY)
Admission: EM | Admit: 2011-06-23 | Discharge: 2011-06-26 | DRG: 641 | Disposition: A | Discharge status: Home Health | Payer: Medicare Other | Attending: Internal Medicine | Admitting: Internal Medicine

## 2011-06-23 ENCOUNTER — Emergency Department (HOSPITAL_COMMUNITY): Payer: Medicare Other

## 2011-06-23 DIAGNOSIS — Z7982 Long term (current) use of aspirin: Secondary | ICD-10-CM

## 2011-06-23 DIAGNOSIS — M869 Osteomyelitis, unspecified: Secondary | ICD-10-CM

## 2011-06-23 DIAGNOSIS — I252 Old myocardial infarction: Secondary | ICD-10-CM

## 2011-06-23 DIAGNOSIS — I6529 Occlusion and stenosis of unspecified carotid artery: Secondary | ICD-10-CM | POA: Diagnosis present

## 2011-06-23 DIAGNOSIS — R531 Weakness: Secondary | ICD-10-CM | POA: Diagnosis present

## 2011-06-23 DIAGNOSIS — R7989 Other specified abnormal findings of blood chemistry: Secondary | ICD-10-CM | POA: Diagnosis present

## 2011-06-23 DIAGNOSIS — I2510 Atherosclerotic heart disease of native coronary artery without angina pectoris: Secondary | ICD-10-CM | POA: Diagnosis present

## 2011-06-23 DIAGNOSIS — F17200 Nicotine dependence, unspecified, uncomplicated: Secondary | ICD-10-CM | POA: Diagnosis present

## 2011-06-23 DIAGNOSIS — F29 Unspecified psychosis not due to a substance or known physiological condition: Secondary | ICD-10-CM | POA: Diagnosis present

## 2011-06-23 DIAGNOSIS — I129 Hypertensive chronic kidney disease with stage 1 through stage 4 chronic kidney disease, or unspecified chronic kidney disease: Secondary | ICD-10-CM | POA: Diagnosis present

## 2011-06-23 DIAGNOSIS — J4489 Other specified chronic obstructive pulmonary disease: Secondary | ICD-10-CM | POA: Diagnosis present

## 2011-06-23 DIAGNOSIS — L0291 Cutaneous abscess, unspecified: Secondary | ICD-10-CM

## 2011-06-23 DIAGNOSIS — Z8614 Personal history of Methicillin resistant Staphylococcus aureus infection: Secondary | ICD-10-CM

## 2011-06-23 DIAGNOSIS — D7589 Other specified diseases of blood and blood-forming organs: Secondary | ICD-10-CM | POA: Diagnosis present

## 2011-06-23 DIAGNOSIS — I739 Peripheral vascular disease, unspecified: Secondary | ICD-10-CM

## 2011-06-23 DIAGNOSIS — I498 Other specified cardiac arrhythmias: Secondary | ICD-10-CM | POA: Diagnosis present

## 2011-06-23 DIAGNOSIS — E118 Type 2 diabetes mellitus with unspecified complications: Secondary | ICD-10-CM

## 2011-06-23 DIAGNOSIS — R4182 Altered mental status, unspecified: Secondary | ICD-10-CM

## 2011-06-23 DIAGNOSIS — Z951 Presence of aortocoronary bypass graft: Secondary | ICD-10-CM

## 2011-06-23 DIAGNOSIS — E785 Hyperlipidemia, unspecified: Secondary | ICD-10-CM | POA: Diagnosis present

## 2011-06-23 DIAGNOSIS — G9340 Encephalopathy, unspecified: Secondary | ICD-10-CM | POA: Diagnosis present

## 2011-06-23 DIAGNOSIS — N189 Chronic kidney disease, unspecified: Secondary | ICD-10-CM | POA: Diagnosis present

## 2011-06-23 DIAGNOSIS — Z79899 Other long term (current) drug therapy: Secondary | ICD-10-CM

## 2011-06-23 DIAGNOSIS — J449 Chronic obstructive pulmonary disease, unspecified: Secondary | ICD-10-CM | POA: Diagnosis present

## 2011-06-23 DIAGNOSIS — E860 Dehydration: Secondary | ICD-10-CM

## 2011-06-23 DIAGNOSIS — S88119A Complete traumatic amputation at level between knee and ankle, unspecified lower leg, initial encounter: Secondary | ICD-10-CM

## 2011-06-23 DIAGNOSIS — A4102 Sepsis due to Methicillin resistant Staphylococcus aureus: Secondary | ICD-10-CM

## 2011-06-23 DIAGNOSIS — Z981 Arthrodesis status: Secondary | ICD-10-CM

## 2011-06-23 DIAGNOSIS — K219 Gastro-esophageal reflux disease without esophagitis: Secondary | ICD-10-CM

## 2011-06-23 DIAGNOSIS — E878 Other disorders of electrolyte and fluid balance, not elsewhere classified: Secondary | ICD-10-CM | POA: Diagnosis present

## 2011-06-23 DIAGNOSIS — E870 Hyperosmolality and hypernatremia: Principal | ICD-10-CM | POA: Diagnosis present

## 2011-06-23 DIAGNOSIS — R410 Disorientation, unspecified: Secondary | ICD-10-CM

## 2011-06-23 DIAGNOSIS — Z720 Tobacco use: Secondary | ICD-10-CM | POA: Diagnosis present

## 2011-06-23 DIAGNOSIS — Z8673 Personal history of transient ischemic attack (TIA), and cerebral infarction without residual deficits: Secondary | ICD-10-CM

## 2011-06-23 DIAGNOSIS — E1169 Type 2 diabetes mellitus with other specified complication: Secondary | ICD-10-CM | POA: Diagnosis present

## 2011-06-23 DIAGNOSIS — I4891 Unspecified atrial fibrillation: Secondary | ICD-10-CM | POA: Diagnosis present

## 2011-06-23 DIAGNOSIS — I959 Hypotension, unspecified: Secondary | ICD-10-CM | POA: Diagnosis present

## 2011-06-23 DIAGNOSIS — I509 Heart failure, unspecified: Secondary | ICD-10-CM | POA: Diagnosis present

## 2011-06-23 HISTORY — DX: Peripheral vascular disease, unspecified: I73.9

## 2011-06-23 HISTORY — DX: Tobacco use: Z72.0

## 2011-06-23 HISTORY — DX: Paroxysmal atrial fibrillation: I48.0

## 2011-06-23 HISTORY — DX: Cerebral infarction, unspecified: I63.9

## 2011-06-23 LAB — URINALYSIS, ROUTINE W REFLEX MICROSCOPIC
Bilirubin Urine: NEGATIVE
Leukocytes, UA: NEGATIVE
Nitrite: NEGATIVE
Specific Gravity, Urine: 1.024 (ref 1.005–1.030)
Urobilinogen, UA: 0.2 mg/dL (ref 0.0–1.0)
pH: 6 (ref 5.0–8.0)

## 2011-06-23 LAB — COMPREHENSIVE METABOLIC PANEL
BUN: 28 mg/dL — ABNORMAL HIGH (ref 6–23)
CO2: 21 mEq/L (ref 19–32)
Calcium: 8.7 mg/dL (ref 8.4–10.5)
Creatinine, Ser: 1.47 mg/dL — ABNORMAL HIGH (ref 0.50–1.35)
GFR calc Af Amer: 54 mL/min — ABNORMAL LOW (ref >90)
GFR calc non Af Amer: 46 mL/min — ABNORMAL LOW (ref >90)
Glucose, Bld: 115 mg/dL — ABNORMAL HIGH (ref 70–99)
Total Protein: 6.8 g/dL (ref 6.0–8.3)

## 2011-06-23 LAB — DIFFERENTIAL
Eosinophils Absolute: 0.1 10*3/uL (ref 0.0–0.7)
Eosinophils Relative: 2 % (ref 0–5)
Lymphocytes Relative: 29 % (ref 12–46)
Lymphs Abs: 1.5 10*3/uL (ref 0.7–4.0)
Monocytes Absolute: 0.4 10*3/uL (ref 0.1–1.0)

## 2011-06-23 LAB — CARDIAC PANEL(CRET KIN+CKTOT+MB+TROPI)
CK, MB: 2.5 ng/mL (ref 0.3–4.0)
Total CK: 58 U/L (ref 7–232)
Troponin I: <0.3 ng/mL (ref <0.30)

## 2011-06-23 LAB — CBC
HCT: 40.9 % (ref 39.0–52.0)
MCH: 34.7 pg — ABNORMAL HIGH (ref 26.0–34.0)
MCV: 106 fL — ABNORMAL HIGH (ref 78.0–100.0)
Platelets: 161 10*3/uL (ref 150–400)
RBC: 3.86 MIL/uL — ABNORMAL LOW (ref 4.22–5.81)
RDW: 14.2 % (ref 11.5–15.5)
WBC: 5.2 10*3/uL (ref 4.0–10.5)

## 2011-06-23 LAB — RAPID URINE DRUG SCREEN, HOSP PERFORMED
Opiates: NOT DETECTED
Tetrahydrocannabinol: NOT DETECTED

## 2011-06-23 LAB — ETHANOL: Alcohol, Ethyl (B): <11 mg/dL (ref 0–11)

## 2011-06-23 MED ORDER — SODIUM CHLORIDE 0.45 % IV SOLN
INTRAVENOUS | Status: DC
  Administered 2011-06-23: 21:00:00 via INTRAVENOUS

## 2011-06-23 MED ORDER — CHLORHEXIDINE GLUCONATE CLOTH 2 % EX PADS
6.0000 | MEDICATED_PAD | Freq: Every day | CUTANEOUS | Status: DC
  Administered 2011-06-24 – 2011-06-26 (×3): 6 via TOPICAL

## 2011-06-23 MED ORDER — HEPARIN SODIUM (PORCINE) 5000 UNIT/ML IJ SOLN
5000.0000 [IU] | Freq: Three times a day (TID) | INTRAMUSCULAR | Status: DC
  Administered 2011-06-23 – 2011-06-26 (×8): 5000 [IU] via SUBCUTANEOUS
  Filled 2011-06-23 (×12): qty 1

## 2011-06-23 MED ORDER — ACETAMINOPHEN 650 MG RE SUPP: 650.0000 mg | Freq: Four times a day (QID) | RECTAL | Status: DC | PRN

## 2011-06-23 MED ORDER — GABAPENTIN 300 MG PO CAPS
300.0000 mg | ORAL_CAPSULE | Freq: Two times a day (BID) | ORAL | Status: DC
  Administered 2011-06-23 – 2011-06-26 (×6): 300 mg via ORAL
  Filled 2011-06-23 (×9): qty 1

## 2011-06-23 MED ORDER — SENNA 8.6 MG PO TABS
1.0000 | ORAL_TABLET | Freq: Two times a day (BID) | ORAL | Status: DC
  Administered 2011-06-23 – 2011-06-26 (×6): 8.6 mg via ORAL
  Filled 2011-06-23 (×6): qty 1

## 2011-06-23 MED ORDER — SODIUM CHLORIDE 0.45 % IV SOLN
INTRAVENOUS | Status: DC
  Administered 2011-06-23 – 2011-06-24 (×2): via INTRAVENOUS

## 2011-06-23 MED ORDER — ACETAMINOPHEN 325 MG PO TABS
650.0000 mg | ORAL_TABLET | Freq: Four times a day (QID) | ORAL | Status: DC | PRN
  Administered 2011-06-24 – 2011-06-26 (×5): 650 mg via ORAL
  Filled 2011-06-23 (×5): qty 2

## 2011-06-23 MED ORDER — DOCUSATE SODIUM 100 MG PO CAPS
100.0000 mg | ORAL_CAPSULE | Freq: Two times a day (BID) | ORAL | Status: DC
  Administered 2011-06-23 – 2011-06-26 (×6): 100 mg via ORAL
  Filled 2011-06-23 (×8): qty 1

## 2011-06-23 MED ORDER — ONDANSETRON HCL 4 MG PO TABS: 4.0000 mg | ORAL_TABLET | Freq: Four times a day (QID) | ORAL | Status: DC | PRN

## 2011-06-23 MED ORDER — SODIUM CHLORIDE 0.9 % IV BOLUS (SEPSIS)
1000.0000 mL | Freq: Once | INTRAVENOUS | Status: AC
  Administered 2011-06-23: 1000 mL via INTRAVENOUS

## 2011-06-23 MED ORDER — SODIUM CHLORIDE 0.9 % IV SOLN
Freq: Once | INTRAVENOUS | Status: AC
  Administered 2011-06-23: 20:00:00 via INTRAVENOUS

## 2011-06-23 MED ORDER — SIMVASTATIN 20 MG PO TABS
20.0000 mg | ORAL_TABLET | Freq: Every day | ORAL | Status: DC
  Administered 2011-06-24 – 2011-06-25 (×2): 20 mg via ORAL
  Filled 2011-06-23 (×3): qty 1

## 2011-06-23 MED ORDER — MIRTAZAPINE 15 MG PO TABS
15.0000 mg | ORAL_TABLET | Freq: Every day | ORAL | Status: DC
  Administered 2011-06-23 – 2011-06-25 (×3): 15 mg via ORAL
  Filled 2011-06-23 (×4): qty 1

## 2011-06-23 MED ORDER — POLYETHYLENE GLYCOL 3350 17 G PO PACK
17.0000 g | PACK | Freq: Every day | ORAL | Status: DC | PRN
  Filled 2011-06-23: qty 1

## 2011-06-23 MED ORDER — ONDANSETRON HCL 4 MG/2ML IJ SOLN: 4.0000 mg | Freq: Four times a day (QID) | INTRAMUSCULAR | Status: DC | PRN

## 2011-06-23 MED ORDER — CITALOPRAM HYDROBROMIDE 40 MG PO TABS
40.0000 mg | ORAL_TABLET | Freq: Every day | ORAL | Status: DC
  Filled 2011-06-23: qty 1

## 2011-06-23 NOTE — ED Notes (Signed)
Pt to CT. md reports she will update family soon.

## 2011-06-23 NOTE — ED Notes (Signed)
WUJ:[id number]<JY> Expected date:06/23/11<BR> Expected time: 2:26 PM<BR> Means of arrival:Ambulance<BR> Comments:<BR> AMS/bila amputee/WC bound

## 2011-06-23 NOTE — ED Notes (Signed)
Per EMS. Pt is from home. Pt has a daughter and daughter-in-law that look after patient. Pt will answer questions appropriately, but becomes "scattered." Family told EMS this is abnormal for the patient.   Pt had issues with writing.

## 2011-06-23 NOTE — H&P (Signed)
PCP:  Londell Moh, MD, MD  Per prior admission note CARDIOLOGIST:  [facility], Dr. Nicki Guadalajara. NEPHROLOGY:  Dr. Marina Gravel.  Chief Complaint:  Weakness, confusion  HPI: 70yoM with multiple medical problems including prior CVA, PVD s/p bilateral BKA's, CAD s/p  CABG, paroxysmal AFib, DM2, and current 2 PPD smoker presents with weakness, mild confusion  and found to have hyperNa, hyperCl, pre-renal azotemia  Pt was last admitted to [facility] 09/2010 with hypotension to SBP 70's, bradycardia to 40's, and  severe weakness. This was thought iatrogenic and he was admitted to telemetry and bystolic,  benicar, and HCTZ were stopped, after which his BP and HR normalized. W/u otherwise was  normal; noted to have Cr of 1.75 though. Of note, admission H&P then noted that he had  similar presentation in 2011 at which point beta blockers was stopped, but on admission he  was back on bystolic and benicar.   On interview, pt able to provide some history but not extensively, and family is now gone,  but they were here earlier providing history to ED staff. He can state that for the past  couple weeks he's felt more confused, no focal neuro deficits, just feeling like he's not  thinking as well. He endorses feeling generally weak, but there are really no other associated symptoms, he denies fevers, chills, sweats, rigors, chest pain, SOB, palpitations, GI symptoms, focal neuro symptoms. Family  reported to ED staff that his SBP was in the 90's yesterday, but he denies any dizziness.  They are concerned about caring for him at home.   In the ED, vitals were stable, lowest BP was 107/54. Labs with minimal hyperNa 147, hyperCl  114, and renal 28/1.47 (BUN is higher than baseline, but Cr is not). LFT"s normal, cardiac  enzymes negative x1, CBC normal except MCV a bit elevated. UA negative, alcohol negative,  UTox negative. CXR negative, CT head with remote left frontal infarct,  remote basal ganglia  lacunar infarcts bilaterally, nothing acute. Pt was given 2L of NS. There was also some ED  concern for bigeminy noted on the monitor.   He does state he drinks one full glass of water a day and gets enough to eat and drink. He denies thirst or feeling dehydrated. Of note, pharmacy tech states that the med list was provided by family and is corroborated from  outpt pharmacy, and includes no nodal agents; only hemodynamic med is olmesartan, and that  many meds were stopped in 10/2010. ROS is otherwise negative.   Past Medical History  Diagnosis Date  . Diabetes mellitus   . Hypertension   . Hyperlipidemia   . Myocardial infarction   . CHF (congestive heart failure)     S/p CABG   . Chronic kidney disease     Baseline Cr appears to be 1.2-1.4   . COPD (chronic obstructive pulmonary disease)   . Occlusion and stenosis of carotid artery without mention of cerebral infarction   . CVA (cerebral infarction)     CT head 06/2011 with remote left frontal infarct, remote basal ganglia  lacunar infarcts bilaterally  . Paroxysmal a-fib   . PVD (peripheral vascular disease)     S/p bilateral BKA's   . Tobacco abuse     States 2 PPD currently     Past Surgical History  Procedure Date  . 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 Coronary artery bypass x3 01/04/2002    Medications:  HOME MEDS: Reconciled by pharm tech with the list and with outpt pharmacy and this appears to be accurate list. Pt cannot reconcile with me  Prior to Admission medications   Medication Sig Start Date End Date Taking? Authorizing Provider  Aspirin-Caffeine (BC FAST PAIN RELIEF) 845-65 MG PACK Take 1 packet by mouth 4 (four) times daily as needed. For pain.   Yes Historical Provider, MD  citalopram (CELEXA) 40 MG tablet Take 40 mg by mouth daily.   Yes Historical Provider, MD  gabapentin (NEURONTIN) 300 MG capsule Take 300 mg by mouth 2 (two) times  daily.    Yes Historical Provider, MD  guaifenesin (HUMIBID E) 400 MG TABS Take 1,200 mg by mouth 2 (two) times daily as needed. For congestion.   Yes Historical Provider, MD  mirtazapine (REMERON) 15 MG tablet Take 15 mg by mouth at bedtime.     Yes Historical Provider, MD  olmesartan (BENICAR) 20 MG tablet Take 20 mg by mouth daily.   Yes Historical Provider, MD  omeprazole (PRILOSEC) 20 MG capsule Take 20 mg by mouth daily.     Yes Historical Provider, MD  pravastatin (PRAVACHOL) 40 MG tablet Take 40 mg by mouth daily.    Yes Historical Provider, MD    Allergies:  Allergies  Allergen Reactions  . Ibuprofen Other (See Comments)    Can't take because of KIDNEY function  . Iohexol      Desc: PT IS NOT ALLERGIC TO CONTRAST- PT HAS BEEN TOLD BY DR FOX TO NOT BE GIVEN CONTRAST DUE TO RENAL INSUFF.  STEPHANIE DAVIS,RT-RCT., Onset Date: [id number]   . Wellbutrin (Bupropion Hcl)     Social History:   reports that he has been smoking Cigarettes.  He has a 110 pack-year smoking history. He has never used smokeless tobacco. He reports that he does not drink alcohol or use illicit drugs. Lives at home alone on [address], but son lives two doors down and helps with daily needs. He gets around in a wheelchair. He currently endorses 2 PPD smoking. He is widowed and has a sone and a daughter.   Family History: Family History  Problem Relation Age of Onset  . Stroke Mother     Physical Exam: Filed Vitals:   06/23/11 1449 06/23/11 1740 06/23/11 1845 06/23/11 1931  BP: 107/54  125/67 133/61  Pulse: 64 64 60 63  Temp: 98.9 F (37.2 C)   98.7 F (37.1 C)  TempSrc: Oral   Oral  Resp: 16 17 16 16   SpO2: 93% 93% 95% 97%   Blood pressure 133/61, pulse 63, temperature 98.7 F (37.1 C), temperature source Oral, resp. rate 16, SpO2 97.00%. Gen: Elderly, stable appearing M in no distress, able to give fair history but not extensive,  able to answer questions accurately, breathing comfortably. He  does have to sit and think for  awhile to answer questions, but eventually does get the right answer. Not toxic or even ill  appearing.  HEENT: Pupils round and reactive to light bilaterally at ~63mm, EOMI, sclera are clear and  normal. Mouth is pretty moist appearing, not dry or parched, but has diffuse caries in what  teeth are left and dentition is poor Lungs: Bibasilar there are end expiratory high pitched wheezes that are transient, improved  with continued respirations. Air movement is not good. No crackles Heart: S1/2 are very difficult to appreciate, cannot really hear at all, but no other heart  sounds appreciated. Chest sternotomy scar Abd: Soft,  minimally distended and overweight but no facial grimacing to palpation, not  tender throughout Extrem: Diffuse ecchymoses on BUE's which are overall normal appearing, normal tone, no  cogwheeling or rigidity, radials moderately well palpated, has BKA bilaterally and overall  appearing normal, no edema Neuro: Alert and attentive but a bit slow mentally. Oriented to [employer], Thursday, but  doesn't know date or year. Does know 2 childrens names, lives on [address], knows his 2  wives passed away years ago. Can give moderately good history. Possible subtle facial  asymmetry but not overt, moves extremities on his own. All extrmeities have intact strength,  quite good for his age. Grossly nonfocal.    Labs & Imaging Results for orders placed during the hospital encounter of 06/23/11 (from the past 48 hour(s))  CBC     Status: Abnormal   Collection Time   06/23/11  3:40 PM      Component Value Range Comment   WBC 5.2  4.0 - 10.5 (K/uL)    RBC 3.86 (*) 4.22 - 5.81 (MIL/uL)    Hemoglobin 13.4  13.0 - 17.0 (g/dL)    HCT 16.1  09.6 - 04.5 (%)    MCV 106.0 (*) 78.0 - 100.0 (fL)    MCH 34.7 (*) 26.0 - 34.0 (pg)    MCHC 32.8  30.0 - 36.0 (g/dL)    RDW 40.9  81.1 - 91.4 (%)    Platelets 161  150 - 400 (K/uL)   DIFFERENTIAL      Status: Normal   Collection Time   06/23/11  3:40 PM      Component Value Range Comment   Neutrophils Relative 61  43 - 77 (%)    Neutro Abs 3.2  1.7 - 7.7 (K/uL)    Lymphocytes Relative 29  12 - 46 (%)    Lymphs Abs 1.5  0.7 - 4.0 (K/uL)    Monocytes Relative 8  3 - 12 (%)    Monocytes Absolute 0.4  0.1 - 1.0 (K/uL)    Eosinophils Relative 2  0 - 5 (%)    Eosinophils Absolute 0.1  0.0 - 0.7 (K/uL)    Basophils Relative 1  0 - 1 (%)    Basophils Absolute 0.1  0.0 - 0.1 (K/uL)   COMPREHENSIVE METABOLIC PANEL     Status: Abnormal   Collection Time   06/23/11  3:40 PM      Component Value Range Comment   Sodium 147 (*) 135 - 145 (mEq/L)    Potassium 3.8  3.5 - 5.1 (mEq/L)    Chloride 114 (*) 96 - 112 (mEq/L)    CO2 21  19 - 32 (mEq/L)    Glucose, Bld 115 (*) 70 - 99 (mg/dL)    BUN 28 (*) 6 - 23 (mg/dL)    Creatinine, Ser 7.82 (*) 0.50 - 1.35 (mg/dL)    Calcium 8.7  8.4 - 10.5 (mg/dL)    Total Protein 6.8  6.0 - 8.3 (g/dL)    Albumin 3.8  3.5 - 5.2 (g/dL)    AST 13  0 - 37 (U/L)    ALT 6  0 - 53 (U/L)    Alkaline Phosphatase 64  39 - 117 (U/L)    Total Bilirubin 0.2 (*) 0.3 - 1.2 (mg/dL)    GFR calc non Af Amer 46 (*) >90 (mL/min)    GFR calc Af Amer 54 (*) >90 (mL/min)   ETHANOL     Status: Normal   Collection  Time   06/23/11  3:40 PM      Component Value Range Comment   Alcohol, Ethyl (B) <11  0 - 11 (mg/dL)   AMMONIA     Status: Normal   Collection Time   06/23/11  3:40 PM      Component Value Range Comment   Ammonia 28  11 - 60 (umol/L)   CARDIAC PANEL(CRET KIN+CKTOT+MB+TROPI)     Status: Normal   Collection Time   06/23/11  3:40 PM      Component Value Range Comment   Total CK 58  7 - 232 (U/L)    CK, MB 2.5  0.3 - 4.0 (ng/mL)    Troponin I <0.30  <0.30 (ng/mL)    Relative Index RELATIVE INDEX IS INVALID  0.0 - 2.5    URINALYSIS, ROUTINE W REFLEX MICROSCOPIC     Status: Normal   Collection Time   06/23/11  5:39 PM      Component Value Range Comment   Color, Urine  YELLOW  YELLOW     APPearance CLEAR  CLEAR     Specific Gravity, Urine 1.024  1.005 - 1.030     pH 6.0  5.0 - 8.0     Glucose, UA NEGATIVE  NEGATIVE (mg/dL)    Hgb urine dipstick NEGATIVE  NEGATIVE     Bilirubin Urine NEGATIVE  NEGATIVE     Ketones, ur NEGATIVE  NEGATIVE (mg/dL)    Protein, ur NEGATIVE  NEGATIVE (mg/dL)    Urobilinogen, UA 0.2  0.0 - 1.0 (mg/dL)    Nitrite NEGATIVE  NEGATIVE     Leukocytes, UA NEGATIVE  NEGATIVE  MICROSCOPIC NOT DONE ON URINES WITH NEGATIVE PROTEIN, BLOOD, LEUKOCYTES, NITRITE, OR GLUCOSE <1000 mg/dL.  URINE RAPID DRUG SCREEN (HOSP PERFORMED)     Status: Normal   Collection Time   06/23/11  5:40 PM      Component Value Range Comment   Opiates NONE DETECTED  NONE DETECTED     Cocaine NONE DETECTED  NONE DETECTED     Benzodiazepines NONE DETECTED  NONE DETECTED     Amphetamines NONE DETECTED  NONE DETECTED     Tetrahydrocannabinol NONE DETECTED  NONE DETECTED     Barbiturates NONE DETECTED  NONE DETECTED     Dg Chest 2 View  06/23/2011  *RADIOLOGY REPORT*  Clinical Data: Chest pain and confusion  CHEST - 2 VIEW  Comparison: September 14, 2010  Findings: The cardiac silhouette, mediastinum, pulmonary vasculature are within normal limits.  Both lungs are clear. There is no acute bony abnormality.  IMPRESSION: There is no evidence of acute cardiac or pulmonary process.  Original Report Authenticated By: Brandon Melnick, M.D.   Ct Head Wo Contrast  06/23/2011  *RADIOLOGY REPORT*  Clinical Data: Altered mental status and fatigue.  Hypotension.  CT HEAD WITHOUT CONTRAST  Technique:  Contiguous axial images were obtained from the base of the skull through the vertex without contrast.  Comparison: CT head without contrast 06/23/2004.  Findings: A remote left frontal lobe non hemorrhagic infarct is stable.  Atrophy extensive white matter changes are similar to the prior exam.  Remote lacunar infarcts are evident within the basal ganglia bilaterally.  No hemorrhage or mass  lesion is evident.  The there is no significant extra-axial fluid collection.  The ventricles are of normal size.  The paranasal sinuses and mastoid air cells are clear.  The osseous skull is intact.  IMPRESSION:  1.  No acute intracranial abnormality or  significant interval change. 2.  Stable atrophy and white matter disease. 3.  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  Original Report Authenticated By: Jamesetta Orleans. MATTERN, M.D.   ECG: NSR with small but present P waves, normal PR, small borderline significant Q waves  inferior leads, narrow QRS, normal RWP, no ST deviations, very flat T waves from V4-6, I,  aVL, and TW inversion III, aVF. Compared to prior the inferior Q waves seem new, but  borderline significance and inferior TWI is new, but lateral flattening is not. Compared to  even prior ECG, on 09/14/2010 there is a much more significant Q wave in III alone.   Telemetry shows HR sinus in the 60's, some alarms for sinus with some occasional runs of  ventricular bigeminy.    Impression Present on Admission:  .DM W/COMPLICATION NOS, TYPE II .RENAL INSUFFICIENCY, CHRONIC .Weakness .Confusion .Hypotension .Hypernatremia .Hyperchloremia .Prerenal azotemia .Macrocytosis without anemia .Tobacco abuse  70yoM with multiple medical problems including prior CVA, PVD s/p bilateral BKA's, CAD s/p  CABG, paroxysmal AFib, DM2, and current 2 PPD smoker presents with complaints of weakness,  mild confusion, and reported hypoTN and found to have hyperNa, hyperCl, pre-renal azotemia  1. Weakness, mild confusion, reported hypoTN, hyperNa, hyperCl: Electrolytes suggests free water  deficit/dehydration, which would explain all of his symptoms. BP's have not been too bad  through ED course.   DDx includes overmedication, given admissions in 2011 and 2012 for bradycardia and hypoTN  that were thought to be medication related, however pt is currently only on  olmesartan.   - Orthostatics. Switch to 1/2 NS overnight and recheck BMET in am and at noon (of note, pt  was given 2L of normal saline in ED). Stopping olmesartan for now, monitor BP's, could  possibly restart this.   2. Acute on chronic renal insufficiency: BUN is higher than baseline in 2011, but Cr appears  somewhat within baseline of 1.2-1.4. This seems to corroborate dehydration.  - IVF's as above and trending BUN  3. Mild bradycardia, bigeminy: HR in the 60's which isn't too bad, and is on no nodal agents to  explain above symptoms. Do not think this is significant.   4. Macrocytosis: Without true anemia. Will check folate, B12 since if these are abnormal could  easily be addressed.   5. Tobacco abuse: Counseling requested in EPIC 6. Social: SW consultation for home needs per prior family request  7. Continue home celexa, gabapentin, remeron, statin. Holding other non essential meds.   SubQ heparin Regular bed, [facility] team 3 Presumed full code   Other plans as per orders.  Isela Stantz 06/23/2011, 9:07 PM

## 2011-06-23 NOTE — ED Notes (Addendum)
pts daughter Gilford Silvius Son Donnie [phone]

## 2011-06-23 NOTE — ED Notes (Signed)
Patient reminded that urine is ordered and still needs to be collected. Patient aware that if urine is not collected, in and out cath may be order to obtain urine.  

## 2011-06-23 NOTE — ED Notes (Signed)
Patient aware of need for urine specimen. Patient unable to void at this time. Patient given urinal. Encouraged to call for assistance if needed.   

## 2011-06-23 NOTE — ED Notes (Signed)
Attempted to call report. Floor RN unable to accept report.  

## 2011-06-23 NOTE — ED Provider Notes (Signed)
History     CSN: [id number]  Arrival date & time 06/23/11  1447   First MD Initiated Contact with Patient 06/23/11 1510      Chief Complaint  Patient presents with  . Altered Mental Status  . Fatigue    (Consider location/radiation/quality/duration/timing/severity/associated sxs/prior treatment) HPI Comments: Most of the history comes from the daughter and daughter-in-law because the patient has some degree of altered mental status at this time. They note that the patient can do most of his ADLs on his own although the daughter-in-law and son lives next door and come over multiple times during the day to assist him with coffee and breakfast and medications as well as dinner and evening medications.  When they checked on him last night he had some low blood pressures as low as 90 systolic.  They noted he seemed excessively somnolent and somewhat more confused than his baseline.  Today when they checked on him he again appeared excessively somnolent and more confused from his baseline.  They noted at 11 AM that he hadn't taken his morning medications which were later at 6:30 in the morning.  There's been no fevers, vomiting, diarrhea, cough, difficulty breathing, abdominal pain or dysuria.  No recent medication changes.  No trauma.  Patient is a 71 y.o. male presenting with altered mental status. The history is provided by the patient and a relative. The history is limited by the condition of the patient. No language interpreter was used.  Altered Mental Status This is a new problem. The current episode started yesterday. The problem occurs constantly. The problem has not changed since onset.Pertinent negatives include no chest pain, no abdominal pain, no headaches and no shortness of breath. The symptoms are aggravated by nothing. The symptoms are relieved by nothing. He has tried nothing for the symptoms.    Past Medical History  Diagnosis Date  . Diabetes mellitus   . Hypertension   .  Hyperlipidemia   . Myocardial infarction   . CHF (congestive heart failure)   . Chronic kidney disease   . COPD (chronic obstructive pulmonary disease)   . Occlusion and stenosis of carotid artery without mention of cerebral infarction     Past Surgical History  Procedure Date  . 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 Stroke Mother     History  Substance Use Topics  . Smoking status: Current Everyday Smoker -- 2.0 packs/day for 55 years    Types: Cigarettes  . Smokeless tobacco: Not on file  . Alcohol Use: No      Review of Systems  Constitutional: Positive for fatigue. Negative for fever and chills.  HENT: Negative.   Eyes: Negative.  Negative for discharge and redness.  Respiratory: Negative.  Negative for cough and shortness of breath.   Cardiovascular: Negative.  Negative for chest pain.  Gastrointestinal: Negative.  Negative for nausea, vomiting and abdominal pain.  Genitourinary: Negative.  Negative for hematuria.  Musculoskeletal: Negative.  Negative for back pain.  Skin: Negative.  Negative for color change and rash.  Neurological: Negative for syncope and headaches.  Hematological: Negative.  Negative for adenopathy.  Psychiatric/Behavioral: Positive for confusion and altered mental status.  All other systems reviewed and are negative.    Allergies  Ibuprofen; Iohexol; and Wellbutrin  Home Medications   Current Outpatient Rx  Name Route Sig Dispense Refill  .  AMIODARONE HCL 200 MG PO TABS Oral Take 200 mg by mouth every other day. On the opposite days take 1/2 tablet     . ASPIRIN-CAFFEINE 845-65 MG PO PACK Oral Take 1 tablet by mouth 3 (three) times daily as needed.      Marland Kitchen BENICAR HCT 40-25 MG PO TABS      . BYSTOLIC 2.5 MG PO TABS      . CARVEDILOL 6.25 MG PO TABS Oral Take 6.25 mg by mouth 2 times daily at 12 noon  and 4 pm.      . CITALOPRAM HYDROBROMIDE 20 MG PO TABS Oral Take 20 mg by mouth daily.      Marland Kitchen CITALOPRAM HYDROBROMIDE 40 MG PO TABS      . OMEGA-3 FATTY ACIDS 1000 MG PO CAPS Oral Take 2 g by mouth daily.      Marland Kitchen FLUTICASONE PROPIONATE  HFA 220 MCG/ACT IN AERO Inhalation Inhale 1 puff into the lungs 2 (two) times daily.      Marland Kitchen GABAPENTIN 300 MG PO CAPS Oral Take 300-600 mg by mouth daily.      Marland Kitchen HYDROCODONE-ACETAMINOPHEN 10-325 MG PO TABS Oral Take 1 tablet by mouth every 6 (six) hours as needed.      Marland Kitchen HYDROCODONE-ACETAMINOPHEN 5-500 MG PO TABS      . ISOSORBIDE MONONITRATE ER 30 MG PO TB24 Oral Take 30 mg by mouth daily.      Marland Kitchen LORAZEPAM 0.5 MG PO TABS Oral Take 0.5 mg by mouth as needed.      Marland Kitchen MIRTAZAPINE 15 MG PO TABS Oral Take 15 mg by mouth at bedtime.      Marland Kitchen MIRTAZAPINE 30 MG PO TABS      . NIACIN ER (ANTIHYPERLIPIDEMIC) 1000 MG PO TBCR Oral Take 1,000 mg by mouth at bedtime.      Marland Kitchen OLMESARTAN MEDOXOMIL 40 MG PO TABS Oral Take 40 mg by mouth daily.      Marland Kitchen OMEPRAZOLE 20 MG PO CPDR Oral Take 20 mg by mouth daily.      Marland Kitchen PANTOPRAZOLE SODIUM 40 MG PO TBEC Oral Take 40 mg by mouth daily.      Marland Kitchen PRAVASTATIN SODIUM 40 MG PO TABS Oral Take 80 mg by mouth daily.     Marland Kitchen RANITIDINE HCL 150 MG PO CAPS Oral Take by mouth 2 (two) times daily.      Marland Kitchen RANOLAZINE ER 500 MG PO TB12 Oral Take 500 mg by mouth 2 (two) times daily.        BP 107/54  Pulse 64  Temp(Src) 98.9 F (37.2 C) (Oral)  Resp 16  SpO2 93%  Physical Exam  Nursing note and vitals reviewed. Constitutional: He appears well-developed and well-nourished.  Non-toxic appearance. He does not have a sickly appearance. No distress.  HENT:  Head: Normocephalic and atraumatic.  Eyes: Conjunctivae, EOM and lids are normal. Pupils are equal, round, and reactive to light.  Neck: Trachea normal, normal range of motion and full passive range of motion without pain. Neck supple.  Cardiovascular: Normal rate, regular rhythm and normal heart  sounds.  Exam reveals no gallop and no friction rub.   No murmur heard. Pulmonary/Chest: Effort normal and breath sounds normal. No respiratory distress. He has no wheezes. He has no rales. He exhibits no tenderness.  Abdominal: Soft. Normal appearance. He exhibits no distension. There is no tenderness. There is no rebound and no CVA tenderness.  Musculoskeletal: Normal range of motion.  Bilateral leg amputations  Neurological: He is alert. He has normal strength.       Patient knows he is at [facility] but does not know the month or the year.  Family notes that at baseline he knows this most of the time but not all of the time.  Symmetric strength in his upper extremities.  No pronator drift.  Normal finger to nose testing bilaterally.  Skin: Skin is warm, dry and intact. No rash noted. He is not diaphoretic.  Psychiatric:       Patient has some baseline confusion so this is difficult to assess    ED Course  Procedures (including critical care time)  Results for orders placed during the hospital encounter of 06/23/11  CBC      Component Value Range   WBC 5.2  4.0 - 10.5 (K/uL)   RBC 3.86 (*) 4.22 - 5.81 (MIL/uL)   Hemoglobin 13.4  13.0 - 17.0 (g/dL)   HCT 16.1  09.6 - 04.5 (%)   MCV 106.0 (*) 78.0 - 100.0 (fL)   MCH 34.7 (*) 26.0 - 34.0 (pg)   MCHC 32.8  30.0 - 36.0 (g/dL)   RDW 40.9  81.1 - 91.4 (%)   Platelets 161  150 - 400 (K/uL)  DIFFERENTIAL      Component Value Range   Neutrophils Relative 61  43 - 77 (%)   Neutro Abs 3.2  1.7 - 7.7 (K/uL)   Lymphocytes Relative 29  12 - 46 (%)   Lymphs Abs 1.5  0.7 - 4.0 (K/uL)   Monocytes Relative 8  3 - 12 (%)   Monocytes Absolute 0.4  0.1 - 1.0 (K/uL)   Eosinophils Relative 2  0 - 5 (%)   Eosinophils Absolute 0.1  0.0 - 0.7 (K/uL)   Basophils Relative 1  0 - 1 (%)   Basophils Absolute 0.1  0.0 - 0.1 (K/uL)  COMPREHENSIVE METABOLIC PANEL      Component Value Range   Sodium 147 (*) 135 - 145 (mEq/L)   Potassium  3.8  3.5 - 5.1 (mEq/L)   Chloride 114 (*) 96 - 112 (mEq/L)   CO2 21  19 - 32 (mEq/L)   Glucose, Bld 115 (*) 70 - 99 (mg/dL)   BUN 28 (*) 6 - 23 (mg/dL)   Creatinine, Ser 7.82 (*) 0.50 - 1.35 (mg/dL)   Calcium 8.7  8.4 - 95.6 (mg/dL)   Total Protein 6.8  6.0 - 8.3 (g/dL)   Albumin 3.8  3.5 - 5.2 (g/dL)   AST 13  0 - 37 (U/L)   ALT 6  0 - 53 (U/L)   Alkaline Phosphatase 64  39 - 117 (U/L)   Total Bilirubin 0.2 (*) 0.3 - 1.2 (mg/dL)   GFR calc non Af Amer 46 (*) >90 (mL/min)   GFR calc Af Amer 54 (*) >90 (mL/min)  URINALYSIS, ROUTINE W REFLEX MICROSCOPIC      Component Value Range   Color, Urine YELLOW  YELLOW    APPearance CLEAR  CLEAR    Specific Gravity, Urine 1.024  1.005 - 1.030    pH 6.0  5.0 - 8.0    Glucose, UA NEGATIVE  NEGATIVE (mg/dL)   Hgb urine dipstick NEGATIVE  NEGATIVE    Bilirubin Urine NEGATIVE  NEGATIVE    Ketones, ur NEGATIVE  NEGATIVE (mg/dL)   Protein, ur NEGATIVE  NEGATIVE (mg/dL)   Urobilinogen, UA 0.2  0.0 - 1.0 (mg/dL)   Nitrite NEGATIVE  NEGATIVE  Leukocytes, UA NEGATIVE  NEGATIVE   ETHANOL      Component Value Range   Alcohol, Ethyl (B) <11  0 - 11 (mg/dL)  URINE RAPID DRUG SCREEN (HOSP PERFORMED)      Component Value Range   Opiates NONE DETECTED  NONE DETECTED    Cocaine NONE DETECTED  NONE DETECTED    Benzodiazepines NONE DETECTED  NONE DETECTED    Amphetamines NONE DETECTED  NONE DETECTED    Tetrahydrocannabinol NONE DETECTED  NONE DETECTED    Barbiturates NONE DETECTED  NONE DETECTED   AMMONIA      Component Value Range   Ammonia 28  11 - 60 (umol/L)  CARDIAC PANEL(CRET KIN+CKTOT+MB+TROPI)      Component Value Range   Total CK 58  7 - 232 (U/L)   CK, MB 2.5  0.3 - 4.0 (ng/mL)   Troponin I <0.30  <0.30 (ng/mL)   Relative Index RELATIVE INDEX IS INVALID  0.0 - 2.5    Dg Chest 2 View  06/23/2011  *RADIOLOGY REPORT*  Clinical Data: Chest pain and confusion  CHEST - 2 VIEW  Comparison: September 14, 2010  Findings: The cardiac silhouette,  mediastinum, pulmonary vasculature are within normal limits.  Both lungs are clear. There is no acute bony abnormality.  IMPRESSION: There is no evidence of acute cardiac or pulmonary process.  Original Report Authenticated By: Brandon Melnick, M.D.   Ct Head Wo Contrast  06/23/2011  *RADIOLOGY REPORT*  Clinical Data: Altered mental status and fatigue.  Hypotension.  CT HEAD WITHOUT CONTRAST  Technique:  Contiguous axial images were obtained from the base of the skull through the vertex without contrast.  Comparison: CT head without contrast 06/23/2004.  Findings: A remote left frontal lobe non hemorrhagic infarct is stable.  Atrophy extensive white matter changes are similar to the prior exam.  Remote lacunar infarcts are evident within the basal ganglia bilaterally.  No hemorrhage or mass lesion is evident.  The there is no significant extra-axial fluid collection.  The ventricles are of normal size.  The paranasal sinuses and mastoid air cells are clear.  The osseous skull is intact.  IMPRESSION:  1.  No acute intracranial abnormality or significant interval change. 2.  Stable atrophy and white matter disease. 3.  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  Original Report Authenticated By: Jamesetta Orleans. MATTERN, M.D.      Date: 06/23/2011  Rate: 64  Rhythm: normal sinus rhythm  QRS Axis: normal  Intervals: normal  ST/T Wave abnormalities: T wave inversions in inferior and lateral leads  Conduction Disutrbances:none  Narrative Interpretation:   Old EKG Reviewed: changes noted since 09-16-10 where inferior T waves were upright    MDM  Patient with some altered mental status of unclear etiology at this time.  Patient has not been febrile or had other symptoms consistent with infection.  His urinalysis shows no UTI and his chest x-ray does not show infiltrates.  Patient does not have a leukocytosis at this time.  Patient has some renal insufficiency but this is  unchanged from his baseline.  He may have some mild dehydration given the history of low blood pressures last night with the mild elevation in his sodium and chloride here.  Patient has received 2 L of normal saline and his pressure has remained stable.  Patient has no focal neurologic deficits to indicate new stroke.  On further reassessment patient still notes that he's at [facility] and  cannot tell me the year but does not know the month.  He still appears somnolent at this time.  On the monitor the patient now is intermittently showing bigeminy as well.  It is unclear to me if this is a chronic problem for this patient were somehow is a cause for the patient's symptoms today.  In the patient's current condition I do not feel he is safe for discharge home since he does live independently and is alone for many hours of the day.  Patient already demonstrated today that he did not take his medications even when they were laid out for him.  I would have concern the patient would not be able to properly do his ADLs and warrants further evaluation and possibly a change in living arrangements.        Nat Christen, MD 06/23/11 1946

## 2011-06-23 NOTE — ED Notes (Signed)
Pt daughter reports pt had low blood pressures last night with BP of  98/50. Pt kept falling asleep and was more lethargic than normal. Pt blood sugars was also monitored with last blood sugar being 108.  Pt was unable to hold cups yesterday.  Pt family reports issues with medication Benicar and reports last change in blood pressure dosage was months ago.  Pt has stage two renal disorder.

## 2011-06-23 NOTE — ED Notes (Signed)
In and out not performed. Pt used urinal.

## 2011-06-24 ENCOUNTER — Inpatient Hospital Stay (HOSPITAL_COMMUNITY): Payer: Medicare Other

## 2011-06-24 LAB — CBC
HCT: 38.5 % — ABNORMAL LOW (ref 39.0–52.0)
MCH: 34.4 pg — ABNORMAL HIGH (ref 26.0–34.0)
MCV: 106.9 fL — ABNORMAL HIGH (ref 78.0–100.0)
RDW: 14.1 % (ref 11.5–15.5)
WBC: 5.3 10*3/uL (ref 4.0–10.5)

## 2011-06-24 LAB — MRSA PCR SCREENING: MRSA by PCR: NEGATIVE

## 2011-06-24 LAB — BASIC METABOLIC PANEL
CO2: 20 mEq/L (ref 19–32)
CO2: 22 mEq/L (ref 19–32)
Calcium: 8.5 mg/dL (ref 8.4–10.5)
Chloride: 117 mEq/L — ABNORMAL HIGH (ref 96–112)
Chloride: 113 mEq/L — ABNORMAL HIGH (ref 96–112)
Creatinine, Ser: 1.2 mg/dL (ref 0.50–1.35)
Creatinine, Ser: 1.15 mg/dL (ref 0.50–1.35)
GFR calc Af Amer: 72 mL/min — ABNORMAL LOW (ref >90)
Glucose, Bld: 68 mg/dL — ABNORMAL LOW (ref 70–99)
Sodium: 141 mEq/L (ref 135–145)

## 2011-06-24 LAB — GLUCOSE, CAPILLARY: Glucose-Capillary: 70 mg/dL (ref 70–99)

## 2011-06-24 MED ORDER — ASPIRIN EC 81 MG PO TBEC
81.0000 mg | DELAYED_RELEASE_TABLET | Freq: Every day | ORAL | Status: DC
  Administered 2011-06-24 – 2011-06-26 (×3): 81 mg via ORAL
  Filled 2011-06-24 (×3): qty 1

## 2011-06-24 MED ORDER — NICOTINE 14 MG/24HR TD PT24
14.0000 mg | MEDICATED_PATCH | Freq: Every day | TRANSDERMAL | Status: DC
  Administered 2011-06-24 – 2011-06-25 (×2): 14 mg via TRANSDERMAL
  Filled 2011-06-24 (×3): qty 1

## 2011-06-24 MED ORDER — ADULT MULTIVITAMIN W/MINERALS CH
1.0000 | ORAL_TABLET | Freq: Every day | ORAL | Status: DC
  Administered 2011-06-24 – 2011-06-26 (×3): 1 via ORAL
  Filled 2011-06-24 (×3): qty 1

## 2011-06-24 MED ORDER — ALBUTEROL SULFATE (5 MG/ML) 0.5% IN NEBU: 2.5000 mg | INHALATION_SOLUTION | RESPIRATORY_TRACT | Status: DC | PRN

## 2011-06-24 MED ORDER — CITALOPRAM HYDROBROMIDE 20 MG PO TABS
20.0000 mg | ORAL_TABLET | Freq: Every day | ORAL | Status: DC
  Administered 2011-06-25 – 2011-06-26 (×2): 20 mg via ORAL
  Filled 2011-06-24 (×2): qty 1

## 2011-06-24 NOTE — Progress Notes (Signed)
Naomie Dean daughter of pt. called this evening. Would like MD to call her in AM with results of MRI. Also let me know a few days ago he had a sudden drop in BP 94/50 with severe hand tremors. Daughter also wanted to let team know that Sajid has been on Celexa for multiple years. The symptoms the pt has are also side effects of Celexa  and concerned maybe the symptoms are the cause of the long term use of this med.  Naomie Dean number [phone] cell [phone]  Pt c/o headache this evening. Pt daughter feels like his headache comes from him looking at the computer for long periods of time with out wearing his prescription glasses that are to be used for the computer.

## 2011-06-24 NOTE — Evaluation (Signed)
Physical Therapy Evaluation Patient Details Name: [name] MRN: [id number] DOB: 09/09/1940 Today's Date: 06/24/2011 Time: 1050-1110 PT Time Calculation (min): 20 min  PT Assessment / Plan / Recommendation Clinical Impression  71 y.o. male admitted with dehydration and renal insufficiency.  Pt has PMH of CVA, PVD, CABG, DM, MI, CHF, and Bilateral BKA.  Pt plans to DC home and would benefit from acute PT to maximize safety and independence with mobility. HHPT recommended.     PT Assessment  Patient needs continued PT services    Follow Up Recommendations  Home health PT    Equipment Recommendations       Frequency Min 3X/week    Precautions / Restrictions Precautions Precautions: None Precaution Comments: Pt is s/p  Bilateral BKA x 1 yr Restrictions Weight Bearing Restrictions: No Other Position/Activity Restrictions: Pt is s/p B BKA x 1 yr   Pertinent Vitals/Pain No c/o pain      Mobility  Bed Mobility Bed Mobility: Supine to Sit Supine to Sit: 6: Modified independent (Device/Increase time);HOB elevated Details for Bed Mobility Assistance: HOB 20* Transfers Transfers: Counselling psychologist Transfer: 5: Supervision Details for Transfer Assistance: assist to steady recliner during transfer Wheelchair Mobility Wheelchair Mobility: No    Exercises     PT Goals Acute Rehab PT Goals PT Goal Formulation: With patient Time For Goal Achievement: 07/08/11 Potential to Achieve Goals: Good Pt will Transfer Bed to Chair/Chair to Bed: with modified independence PT Transfer Goal: Bed to Chair/Chair to Bed - Progress: Goal set today Pt will Perform Home Exercise Program: Independently PT Goal: Perform Home Exercise Program - Progress: Goal set today Pt will Propel Wheelchair: 51 - 150 feet PT Goal: Propel Wheelchair - Progress: Goal set today  Visit Information  Last PT Received On: 06/24/11 Assistance Needed: +1    Subjective Data  Subjective: "I haven't walked in 2 or 3 months because of this sore on my R knee. I didn't want the artificial leg to open that sore back up." Patient Stated Goal: return to walking; get outside more   Prior Functioning  Home Living Lives With: Alone Available Help at Discharge: Family Type of Home: House Home Access: Ramped entrance Home Layout: One level Bathroom Shower/Tub:  (pt's description unclear) Bathroom Toilet:  (pt's description unclear) Home Adaptive Equipment: Tub transfer bench;Walker - rolling;Wheelchair - manual Additional Comments: pt stated he has primarily been using his WC the past couple months due to having a sore on his R knee  Prior Function Level of Independence: Needs assistance Needs Assistance: Meal Prep Able to Take Stairs?: No Vocation: Retired Comments: Banker: Expressive difficulties;Other (comment) (speech difficult to understand at times)    Cognition  Overall Cognitive Status: Appears within functional limits for tasks assessed/performed Arousal/Alertness: Awake/alert Orientation Level: Appears intact for tasks assessed Behavior During Session: [facility] for tasks performed    Extremity/Trunk Assessment Right Upper Extremity Assessment RUE ROM/Strength/Tone: WFL for tasks assessed RUE Coordination: WFL - gross/fine motor Left Upper Extremity Assessment LUE ROM/Strength/Tone: WFL for tasks assessed LUE Coordination: WFL - gross/fine motor Right Lower Extremity Assessment RLE ROM/Strength/Tone: Within functional levels RLE Sensation: WFL - Light Touch RLE Coordination: WFL - gross/fine motor Left Lower Extremity Assessment LLE ROM/Strength/Tone: Within functional levels LLE Sensation: WFL - Light Touch LLE Coordination: WFL - gross/fine motor Trunk Assessment Trunk Assessment: Normal   Balance    End of Session PT - End of Session Activity Tolerance: Patient tolerated treatment well Patient left: in chair;with  call  bell/phone within reach Nurse Communication: Mobility status   Tamala Ser 06/24/2011, 12:16 PM  [phone]

## 2011-06-24 NOTE — Progress Notes (Signed)
Spoke with daughter of patient, [name], which is requesting rounding MD to please give her a call at [phone] to give her an update on his condition.

## 2011-06-24 NOTE — Progress Notes (Signed)
Attempted to meet with Pt to continue d/c plans.  Pt currently getting an MRI.   Reviewed chart.  Noted that PT recommending d/c with HH PT.  No further CSW needs identified.  CSW to sign off.  Providence Crosby, LCSWA Clinical Social Work [phone]

## 2011-06-24 NOTE — Progress Notes (Signed)
[id number]/chart review performed/

## 2011-06-24 NOTE — Progress Notes (Signed)
Subjective: Patient feels about the same.  He says this intermittent confusion has been going on for about three months.   He said he mentioned this to his Cardiologist and his PCP.   Objective: Weight change:   Intake/Output Summary (Last 24 hours) at 06/24/11 1417 Last data filed at 06/24/11 0558  Gross per 24 hour  Intake 876.67 ml  Output      0 ml  Net 876.67 ml    Filed Vitals:   06/24/11 0603  BP: 152/56  Pulse: 66  Temp: 98.2 F (36.8 C)  Resp: 16   General: Patient does not seem to be in any acute distress. He is alert and able to give history. Cardiovascular: Regular rate rhythm Lungs: Clear bilaterally Abdomen: Positive bowel sounds Extremities: Bilateral amputation  Lab Results: Reviewed  Micro Results: Recent Results (from the past 240 hour(s))  MRSA PCR SCREENING     Status: Normal   Collection Time   06/23/11 11:08 PM      Component Value Range Status Comment   MRSA by PCR NEGATIVE  NEGATIVE  Final     Studies/Results: Dg Chest 2 View  06/23/2011  *RADIOLOGY REPORT*  Clinical Data: Chest pain and confusion  CHEST - 2 VIEW  Comparison: September 14, 2010  Findings: The cardiac silhouette, mediastinum, pulmonary vasculature are within normal limits.  Both lungs are clear. There is no acute bony abnormality.  IMPRESSION: There is no evidence of acute cardiac or pulmonary process.  Original Report Authenticated By: Brandon Melnick, M.D.   Ct Head Wo Contrast  06/23/2011  *RADIOLOGY REPORT*  Clinical Data: Altered mental status and fatigue.  Hypotension.  CT HEAD WITHOUT CONTRAST  Technique:  Contiguous axial images were obtained from the base of the skull through the vertex without contrast.  Comparison: CT head without contrast 06/23/2004.  Findings: A remote left frontal lobe non hemorrhagic infarct is stable.  Atrophy extensive white matter changes are similar to the prior exam.  Remote lacunar infarcts are evident within the basal ganglia bilaterally.  No  hemorrhage or mass lesion is evident.  The there is no significant extra-axial fluid collection.  The ventricles are of normal size.  The paranasal sinuses and mastoid air cells are clear.  The osseous skull is intact.  IMPRESSION:  1.  No acute intracranial abnormality or significant interval change. 2.  Stable atrophy and white matter disease. 3.  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  Original Report Authenticated By: Jamesetta Orleans. MATTERN, M.D.   Medications: Scheduled Meds:   . sodium chloride   Intravenous Once  . aspirin EC  81 mg Oral Daily  . Chlorhexidine Gluconate Cloth  6 each Topical Q0600  . citalopram  20 mg Oral Daily  . docusate sodium  100 mg Oral BID  . gabapentin  300 mg Oral BID  . heparin  5,000 Units Subcutaneous Q8H  . mirtazapine  15 mg Oral QHS  . mulitivitamin with minerals  1 tablet Oral Daily  . nicotine  14 mg Transdermal Daily  . senna  1 tablet Oral BID  . simvastatin  20 mg Oral q1800  . sodium chloride  1,000 mL Intravenous Once  . sodium chloride  1,000 mL Intravenous Once  . DISCONTD: citalopram  40 mg Oral Daily   Continuous Infusions:   . sodium chloride 20 mL/hr (06/24/11 1156)  . DISCONTD: sodium chloride Stopped (06/23/11 2112)   PRN Meds:.acetaminophen, acetaminophen, albuterol, ondansetron (ZOFRAN) IV, ondansetron,  polyethylene glycol  Assessment/Plan: Confusion (06/23/2011) Patient's workup so far is negative. Head CT scan does not show any acute stroke. Discussed extensively with patient's daughter Kaoru Rezendes phone [phone]. She feels he has these episodes more so when his blood pressure drops from his blood pressure medications. She feels like he is not taking care of himself and that he does not go to doctors as he should. She states that he was extremely confused last night and was not able to have a conversation with her on the phone.  She states she was weak in the arms and was not able to hold  anything .  Will get MRI of the head.  Discussed with her that if his tests are negative then he will have to be discharged and follow up outpatient with his primary care doctor to work through these issues.   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 Diet controlled   RENAL INSUFFICIENCY, CHRONIC (10/04/2006) Chronic. Per daughter he sees Dr. Caryn Section outpatient.   Weakness (06/23/2011) Physical therapy consulted. No focal deficit.   Hypotension (06/23/2011) Medication induced. Will hold some of his antihypertensives for now.   Hypernatremia (06/23/2011) Resolved with IV hydration   Macrocytosis without anemia (06/23/2011) Vitamin B 12 level is normal   Tobacco abuse (06/23/2011)  nicotine patch   LOS: 1 day   Carollee Massed Pager [phone]  06/24/2011, 2:17 PM

## 2011-06-24 NOTE — Progress Notes (Signed)
Met with Pt re: d/c plans.  Pt reported that he intends to d/c home, as he feels that he's able to care for himself.    Pt reported that his son lives close to him and that his son assists him, when needed.  Pt reported that he has a daughter who lives outside of [place].  Pt asked the NT, at this point, to use the restroom.  CSW to meet with Pt again at a later time.  CSW to continue to follow.  Providence Crosby, LCSWA Clinical Social Work [phone]

## 2011-06-25 LAB — BASIC METABOLIC PANEL
CO2: 20 mEq/L (ref 19–32)
Chloride: 113 mEq/L — ABNORMAL HIGH (ref 96–112)
Creatinine, Ser: 1.22 mg/dL (ref 0.50–1.35)
Potassium: 3.8 mEq/L (ref 3.5–5.1)

## 2011-06-25 LAB — GLUCOSE, CAPILLARY: Glucose-Capillary: 85 mg/dL (ref 70–99)

## 2011-06-25 LAB — CBC
MCV: 103.7 fL — ABNORMAL HIGH (ref 78.0–100.0)
Platelets: 146 10*3/uL — ABNORMAL LOW (ref 150–400)
RBC: 3.75 MIL/uL — ABNORMAL LOW (ref 4.22–5.81)
WBC: 5.7 10*3/uL (ref 4.0–10.5)

## 2011-06-25 MED ORDER — IRBESARTAN 75 MG PO TABS
75.0000 mg | ORAL_TABLET | Freq: Every day | ORAL | Status: DC
  Administered 2011-06-25 – 2011-06-26 (×2): 75 mg via ORAL
  Filled 2011-06-25 (×2): qty 1

## 2011-06-25 NOTE — Progress Notes (Signed)
Subjective: Patient feels about the same.      Objective: Weight change:   Intake/Output Summary (Last 24 hours) at 06/24/11 1417 Last data filed at 06/24/11 0558  Gross per 24 hour  Intake 876.67 ml  Output      0 ml  Net 876.67 ml    Filed Vitals:   06/25/11 1446  BP: 170/73  Pulse: 70  Temp: 98.4 F (36.9 C)  Resp: 18   General: Patient does not seem to be in any acute distress. He is alert and able to give history. Cardiovascular: Regular rate rhythm Lungs: Clear bilaterally Abdomen: Positive bowel sounds Extremities: Bilateral amputation  Lab Results: Reviewed  Micro Results: Recent Results (from the past 240 hour(s))  MRSA PCR SCREENING     Status: Normal   Collection Time   06/23/11 11:08 PM      Component Value Range Status Comment   MRSA by PCR NEGATIVE  NEGATIVE  Final     Studies/Results: Dg Chest 2 View  06/23/2011  *RADIOLOGY REPORT*  Clinical Data: Chest pain and confusion  CHEST - 2 VIEW  Comparison: September 14, 2010  Findings: The cardiac silhouette, mediastinum, pulmonary vasculature are within normal limits.  Both lungs are clear. There is no acute bony abnormality.  IMPRESSION: There is no evidence of acute cardiac or pulmonary process.  Original Report Authenticated By: Brandon Melnick, M.D.   Ct Head Wo Contrast  06/23/2011  *RADIOLOGY REPORT*  Clinical Data: Altered mental status and fatigue.  Hypotension.  CT HEAD WITHOUT CONTRAST  Technique:  Contiguous axial images were obtained from the base of the skull through the vertex without contrast.  Comparison: CT head without contrast 06/23/2004.  Findings: A remote left frontal lobe non hemorrhagic infarct is stable.  Atrophy extensive white matter changes are similar to the prior exam.  Remote lacunar infarcts are evident within the basal ganglia bilaterally.  No hemorrhage or mass lesion is evident.  The there is no significant extra-axial fluid collection.  The ventricles are of normal size.  The  paranasal sinuses and mastoid air cells are clear.  The osseous skull is intact.  IMPRESSION:  1.  No acute intracranial abnormality or significant interval change. 2.  Stable atrophy and white matter disease. 3.  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  Original Report Authenticated By: Jamesetta Orleans. MATTERN, M.D.   Medications: Scheduled Meds:   . sodium chloride   Intravenous Once  . aspirin EC  81 mg Oral Daily  . Chlorhexidine Gluconate Cloth  6 each Topical Q0600  . citalopram  20 mg Oral Daily  . docusate sodium  100 mg Oral BID  . gabapentin  300 mg Oral BID  . heparin  5,000 Units Subcutaneous Q8H  . mirtazapine  15 mg Oral QHS  . mulitivitamin with minerals  1 tablet Oral Daily  . nicotine  14 mg Transdermal Daily  . senna  1 tablet Oral BID  . simvastatin  20 mg Oral q1800  . sodium chloride  1,000 mL Intravenous Once  . sodium chloride  1,000 mL Intravenous Once  . DISCONTD: citalopram  40 mg Oral Daily   Continuous Infusions:   . sodium chloride 20 mL/hr (06/24/11 1156)  . DISCONTD: sodium chloride Stopped (06/23/11 2112)   PRN Meds:.acetaminophen, acetaminophen, albuterol, ondansetron (ZOFRAN) IV, ondansetron, polyethylene glycol  Assessment/Plan: Confusion (06/23/2011) Patient's workup  is negative. Head CT scan and MRI does not show any acute stroke. Discussed extensively  again today with patient's daughter Brittain Hosie phone [phone]. Patient will  Be discharged tomorrow to follow up with PCP.  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 Diet controlled   RENAL INSUFFICIENCY, CHRONIC (10/04/2006) Chronic. Per daughter he sees Dr. Caryn Section outpatient.   Weakness (06/23/2011) Physical therapy consulted. No focal deficit.   Hypotension (06/23/2011)/HTN Medication induced hypotension. Restart  Antihypertensives meds.   Hypernatremia (06/23/2011) Resolved with IV hydration   Macrocytosis without anemia  (06/23/2011) Vitamin B 12 level is normal   Tobacco abuse (06/23/2011)  nicotine patch  Disp: In AM   LOS: 2 days   Carollee Massed Pager [phone]  06/25/2011, 3:21 PM

## 2011-06-26 LAB — GLUCOSE, CAPILLARY: Glucose-Capillary: 64 mg/dL — ABNORMAL LOW (ref 70–99)

## 2011-06-26 MED ORDER — ADULT MULTIVITAMIN W/MINERALS CH: 1.0000 | ORAL_TABLET | Freq: Every day | ORAL | Status: DC

## 2011-06-26 MED ORDER — ASPIRIN 81 MG PO TBEC: 81.0000 mg | DELAYED_RELEASE_TABLET | Freq: Every day | ORAL | Status: AC

## 2011-06-26 MED ORDER — HYDRALAZINE HCL 20 MG/ML IJ SOLN
5.0000 mg | Freq: Once | INTRAMUSCULAR | Status: AC
  Administered 2011-06-26: 5 mg via INTRAVENOUS
  Filled 2011-06-26: qty 1

## 2011-06-26 MED ORDER — CITALOPRAM HYDROBROMIDE 40 MG PO TABS: 20.0000 mg | ORAL_TABLET | Freq: Every day | ORAL | Status: DC

## 2011-06-26 NOTE — Progress Notes (Signed)
Gave pt discharge instructions and explained them to him. Pt verbalized understanding of instructions given. Pt left via wheelchair with tech and daughter in no acute distress.

## 2011-06-26 NOTE — Discharge Summary (Signed)
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 Secondary to alcohol use.    Initial presentation: 70yoM with multiple medical problems including prior CVA, PVD s/p bilateral BKA's, CAD s/p  CABG, paroxysmal AFib, DM2, and current 2 PPD smoker presents with weakness, mild confusion  and found to have hyperNa, hyperCl, pre-renal azotemia  Pt was last admitted to [facility] 09/2010 with hypotension to SBP 70's, bradycardia to 40's, and  severe weakness. This was thought iatrogenic and he was admitted to telemetry and bystolic,  benicar, and HCTZ were stopped, after which his BP and HR normalized. W/u otherwise was  normal; noted to have Cr of 1.75 though. Of note, admission H&P then noted that he had  similar presentation in 2011 at which point beta blockers was stopped, but on admission he  was back on bystolic and benicar.  On interview, pt able to provide some history but not extensively, and family is now gone,  but they were here earlier providing history to ED staff. He can state that for the past  couple weeks he's felt more confused, no focal neuro deficits, just feeling like he's not  thinking as well. He endorses feeling generally weak, but there are really no other associated symptoms, he denies fevers, chills, sweats, rigors, chest pain, SOB, palpitations, GI symptoms, focal neuro symptoms. Family  reported to ED staff that his SBP was in the 90's yesterday, but he denies any dizziness.  They are  concerned about caring for him at home.  In the ED, vitals were stable, lowest BP was 107/54. Labs with minimal hyperNa 147, hyperCl  114, and renal 28/1.47 (BUN is higher than baseline, but Cr is not). LFT"s normal, cardiac  enzymes negative x1, CBC normal except MCV a bit elevated. UA negative, alcohol negative,  UTox negative. CXR negative, CT head with remote left frontal infarct, remote basal ganglia  lacunar infarcts bilaterally, nothing acute. Pt was given 2L of NS. There was also some ED  concern for bigeminy noted on the monitor.  He does state he drinks one full glass of water a day and gets enough to eat and drink. He denies thirst or feeling dehydrated. Of note, pharmacy tech states that the med list was provided by family and is corroborated from  outpt pharmacy, and includes no nodal agents; only hemodynamic med is olmesartan, and that  many meds were stopped in 10/2010.   Hospital Course: .Weakness Patients weakness is multifactorial, He has no focal deficits.  He feels weak overall.  Patient has prosthesis but he left them at home.  Per patient's daughter her and her siblings check on him daily.  Physical therapy recommended home PT.  Patient will follow up with [name] regarding this.  .Confusion Patient states that he has been very slow over the past few months.  He states that he has discussed this with his PCP and his Cardiologist but no source has been found for his confusion.  He had a MRI here that did not show any acute findings.  Patient is able to give detailed history so he is not truly confused.  Marland Kitchen  Hypotension Patients hypotension was due to poor intake and medications.  He was given IVF and blood pressure improved.  His low dose ACEI was restarted secondary to elevated blood pressure.  He will follow up with Dr. Renne Crigler.  .Hypernatremia This was secondary to mild dehydration and resolved prior to discharge.   Prerenal azotemia This was also secondary to mild  dehydration and resolved with IVF.  Marland KitchenMacrocytosis without anemia B12 level was within normal limits.  .Tobacco abuse/Alcohol Use Encouraged cessation     Medication List  As of 06/26/2011 10:40 AM   STOP taking these medications         BC FAST PAIN RELIEF 845-65 MG Pack         TAKE these medications         aspirin 81 MG EC tablet   Take 1 tablet (81 mg total) by mouth daily.      citalopram 40 MG tablet   Commonly known as: CELEXA   Take 0.5 tablets (20 mg total) by mouth daily.      gabapentin 300 MG capsule   Commonly known as: NEURONTIN   Take 300 mg by mouth 2 (two) times daily.      guaifenesin 400 MG Tabs   Commonly known as: HUMIBID E   Take 1,200 mg by mouth 2 (two) times daily as needed. For congestion.      mirtazapine 15 MG tablet   Commonly known as: REMERON   Take 15 mg by mouth at bedtime.      mulitivitamin with minerals Tabs   Take 1 tablet by mouth daily.      olmesartan 20 MG tablet   Commonly known as: BENICAR   Take 20 mg by mouth daily.      omeprazole 20 MG capsule   Commonly known as: PRILOSEC   Take 20 mg by mouth daily.      pravastatin 40 MG tablet   Commonly known as: PRAVACHOL   Take 40 mg by mouth daily.             Day of Discharge BP 168/90  Pulse 63  Temp(Src) 97.8 F (36.6 C) (Oral)  Resp 16  Ht 6\' 1"  (1.854 m)  Wt 70.1 kg (154 lb 8.7 oz)  BMI 20.39 kg/m2  SpO2 96%  Physical Exam: General: Patient does not seem to be in any acute distress. He is alert and able to give history.  Cardiovascular: Regular rate rhythm  Lungs: Clear bilaterally  Abdomen: Positive bowel sounds  Extremities: Bilateral amputation Neuro: Nonfocal   Results for orders placed during the hospital encounter of 06/23/11 (from the past 24 hour(s))  GLUCOSE, CAPILLARY     Status: Abnormal   Collection Time   06/26/11  7:19 AM      Component Value Range   Glucose-Capillary 64 (*) 70 - 99 (mg/dL)    Disposition: Home  Follow-up  Appts: Discharge Orders    Future Orders Please Complete By Expires   Diet Carb Modified      Increase activity slowly         Follow-up Information    Follow up with Londell Moh, MD in 1 week.   Contact information:   [address], [address] [facility] [place] [address] [phone]           Time spent in discharge (includes decision making & examination of pt): 45 minutes  Signed: Carollee Massed Pager [phone] 06/26/2011, 10:40 AM

## 2011-06-26 NOTE — Progress Notes (Signed)
BP 168/90 Claiborne Billings called Hydralazine given PRN.

## 2011-06-27 LAB — GLUCOSE, CAPILLARY: Glucose-Capillary: 138 mg/dL — ABNORMAL HIGH (ref 70–99)

## 2012-03-16 ENCOUNTER — Other Ambulatory Visit (HOSPITAL_COMMUNITY): Payer: Self-pay | Admitting: Cardiovascular Disease

## 2012-03-16 ENCOUNTER — Other Ambulatory Visit (HOSPITAL_COMMUNITY): Payer: Self-pay | Admitting: Physician Assistant

## 2012-03-16 DIAGNOSIS — I714 Abdominal aortic aneurysm, without rupture: Secondary | ICD-10-CM

## 2012-03-16 DIAGNOSIS — I2510 Atherosclerotic heart disease of native coronary artery without angina pectoris: Secondary | ICD-10-CM

## 2012-03-16 DIAGNOSIS — I350 Nonrheumatic aortic (valve) stenosis: Secondary | ICD-10-CM

## 2012-03-27 ENCOUNTER — Other Ambulatory Visit (HOSPITAL_COMMUNITY): Payer: Self-pay | Admitting: Cardiovascular Disease

## 2012-03-27 DIAGNOSIS — I2510 Atherosclerotic heart disease of native coronary artery without angina pectoris: Secondary | ICD-10-CM

## 2012-03-28 ENCOUNTER — Encounter (HOSPITAL_COMMUNITY): Payer: Medicare Other

## 2012-04-03 ENCOUNTER — Encounter (HOSPITAL_COMMUNITY): Payer: Medicare Other

## 2012-04-12 ENCOUNTER — Ambulatory Visit (HOSPITAL_COMMUNITY)
Admission: RE | Admit: 2012-04-12 | Discharge: 2012-04-12 | Disposition: A | Discharge status: Home / Self Care | Payer: Medicare Other | Source: Ambulatory Visit | Attending: Cardiovascular Disease | Admitting: Cardiovascular Disease

## 2012-04-12 DIAGNOSIS — I714 Abdominal aortic aneurysm, without rupture, unspecified: Secondary | ICD-10-CM | POA: Insufficient documentation

## 2012-04-12 NOTE — Progress Notes (Signed)
Aorta Duplex Completed. Courtez Twaddle D  

## 2013-03-19 ENCOUNTER — Other Ambulatory Visit: Payer: Self-pay | Admitting: Internal Medicine

## 2013-03-19 DIAGNOSIS — R109 Unspecified abdominal pain: Secondary | ICD-10-CM

## 2013-03-22 ENCOUNTER — Other Ambulatory Visit: Payer: Medicare Other

## 2013-03-28 ENCOUNTER — Ambulatory Visit
Admission: RE | Admit: 2013-03-28 | Discharge: 2013-03-28 | Disposition: A | Discharge status: Home / Self Care | Payer: Medicare Other | Source: Ambulatory Visit | Attending: Internal Medicine | Admitting: Internal Medicine

## 2013-03-28 DIAGNOSIS — R109 Unspecified abdominal pain: Secondary | ICD-10-CM

## 2013-04-03 ENCOUNTER — Telehealth (HOSPITAL_COMMUNITY): Payer: Self-pay | Admitting: *Deleted

## 2013-04-25 ENCOUNTER — Telehealth (HOSPITAL_COMMUNITY): Payer: Self-pay | Admitting: *Deleted

## 2013-04-29 ENCOUNTER — Telehealth (HOSPITAL_COMMUNITY): Payer: Self-pay | Admitting: *Deleted

## 2013-05-04 ENCOUNTER — Encounter: Payer: Self-pay | Admitting: Cardiovascular Disease

## 2013-05-16 ENCOUNTER — Telehealth (HOSPITAL_COMMUNITY): Payer: Self-pay | Admitting: *Deleted

## 2014-04-23 ENCOUNTER — Telehealth: Payer: Self-pay | Admitting: *Deleted

## 2014-04-23 NOTE — Telephone Encounter (Signed)
error 

## 2014-05-20 DIAGNOSIS — E119 Type 2 diabetes mellitus without complications: Secondary | ICD-10-CM | POA: Diagnosis not present

## 2014-05-20 DIAGNOSIS — I10 Essential (primary) hypertension: Secondary | ICD-10-CM | POA: Diagnosis not present

## 2014-05-20 DIAGNOSIS — E780 Pure hypercholesterolemia: Secondary | ICD-10-CM | POA: Diagnosis not present

## 2014-05-27 DIAGNOSIS — Z0000 Encounter for general adult medical examination without abnormal findings: Secondary | ICD-10-CM | POA: Diagnosis not present

## 2014-05-27 DIAGNOSIS — Z23 Encounter for immunization: Secondary | ICD-10-CM | POA: Diagnosis not present

## 2014-07-17 DIAGNOSIS — Z9189 Other specified personal risk factors, not elsewhere classified: Secondary | ICD-10-CM | POA: Diagnosis not present

## 2014-07-17 DIAGNOSIS — R413 Other amnesia: Secondary | ICD-10-CM | POA: Diagnosis not present

## 2014-07-17 DIAGNOSIS — L0291 Cutaneous abscess, unspecified: Secondary | ICD-10-CM | POA: Diagnosis not present

## 2014-07-17 DIAGNOSIS — S0011XA Contusion of right eyelid and periocular area, initial encounter: Secondary | ICD-10-CM | POA: Diagnosis not present

## 2014-08-11 DIAGNOSIS — M6281 Muscle weakness (generalized): Secondary | ICD-10-CM | POA: Diagnosis not present

## 2014-08-11 DIAGNOSIS — Z89512 Acquired absence of left leg below knee: Secondary | ICD-10-CM | POA: Diagnosis not present

## 2014-08-11 DIAGNOSIS — Z720 Tobacco use: Secondary | ICD-10-CM | POA: Diagnosis not present

## 2014-08-11 DIAGNOSIS — F0390 Unspecified dementia without behavioral disturbance: Secondary | ICD-10-CM | POA: Diagnosis not present

## 2014-08-11 DIAGNOSIS — Z9181 History of falling: Secondary | ICD-10-CM | POA: Diagnosis not present

## 2014-08-11 DIAGNOSIS — Z89511 Acquired absence of right leg below knee: Secondary | ICD-10-CM | POA: Diagnosis not present

## 2014-08-11 DIAGNOSIS — L89322 Pressure ulcer of left buttock, stage 2: Secondary | ICD-10-CM | POA: Diagnosis not present

## 2014-08-11 DIAGNOSIS — J449 Chronic obstructive pulmonary disease, unspecified: Secondary | ICD-10-CM | POA: Diagnosis not present

## 2014-08-11 DIAGNOSIS — R269 Unspecified abnormalities of gait and mobility: Secondary | ICD-10-CM | POA: Diagnosis not present

## 2014-08-13 DIAGNOSIS — Z720 Tobacco use: Secondary | ICD-10-CM | POA: Diagnosis not present

## 2014-08-13 DIAGNOSIS — F0390 Unspecified dementia without behavioral disturbance: Secondary | ICD-10-CM | POA: Diagnosis not present

## 2014-08-13 DIAGNOSIS — Z89512 Acquired absence of left leg below knee: Secondary | ICD-10-CM | POA: Diagnosis not present

## 2014-08-13 DIAGNOSIS — Z9181 History of falling: Secondary | ICD-10-CM | POA: Diagnosis not present

## 2014-08-13 DIAGNOSIS — M6281 Muscle weakness (generalized): Secondary | ICD-10-CM | POA: Diagnosis not present

## 2014-08-13 DIAGNOSIS — Z89511 Acquired absence of right leg below knee: Secondary | ICD-10-CM | POA: Diagnosis not present

## 2014-08-13 DIAGNOSIS — L89322 Pressure ulcer of left buttock, stage 2: Secondary | ICD-10-CM | POA: Diagnosis not present

## 2014-08-13 DIAGNOSIS — J449 Chronic obstructive pulmonary disease, unspecified: Secondary | ICD-10-CM | POA: Diagnosis not present

## 2014-08-13 DIAGNOSIS — R269 Unspecified abnormalities of gait and mobility: Secondary | ICD-10-CM | POA: Diagnosis not present

## 2014-08-14 DIAGNOSIS — F0390 Unspecified dementia without behavioral disturbance: Secondary | ICD-10-CM | POA: Diagnosis not present

## 2014-08-14 DIAGNOSIS — Z720 Tobacco use: Secondary | ICD-10-CM | POA: Diagnosis not present

## 2014-08-14 DIAGNOSIS — L89322 Pressure ulcer of left buttock, stage 2: Secondary | ICD-10-CM | POA: Diagnosis not present

## 2014-08-14 DIAGNOSIS — M6281 Muscle weakness (generalized): Secondary | ICD-10-CM | POA: Diagnosis not present

## 2014-08-14 DIAGNOSIS — Z89511 Acquired absence of right leg below knee: Secondary | ICD-10-CM | POA: Diagnosis not present

## 2014-08-14 DIAGNOSIS — Z89512 Acquired absence of left leg below knee: Secondary | ICD-10-CM | POA: Diagnosis not present

## 2014-08-14 DIAGNOSIS — R269 Unspecified abnormalities of gait and mobility: Secondary | ICD-10-CM | POA: Diagnosis not present

## 2014-08-14 DIAGNOSIS — J449 Chronic obstructive pulmonary disease, unspecified: Secondary | ICD-10-CM | POA: Diagnosis not present

## 2014-08-14 DIAGNOSIS — Z9181 History of falling: Secondary | ICD-10-CM | POA: Diagnosis not present

## 2014-08-15 DIAGNOSIS — F0390 Unspecified dementia without behavioral disturbance: Secondary | ICD-10-CM | POA: Diagnosis not present

## 2014-08-15 DIAGNOSIS — J449 Chronic obstructive pulmonary disease, unspecified: Secondary | ICD-10-CM | POA: Diagnosis not present

## 2014-08-15 DIAGNOSIS — L89322 Pressure ulcer of left buttock, stage 2: Secondary | ICD-10-CM | POA: Diagnosis not present

## 2014-08-15 DIAGNOSIS — Z89512 Acquired absence of left leg below knee: Secondary | ICD-10-CM | POA: Diagnosis not present

## 2014-08-15 DIAGNOSIS — M6281 Muscle weakness (generalized): Secondary | ICD-10-CM | POA: Diagnosis not present

## 2014-08-15 DIAGNOSIS — Z9181 History of falling: Secondary | ICD-10-CM | POA: Diagnosis not present

## 2014-08-15 DIAGNOSIS — R269 Unspecified abnormalities of gait and mobility: Secondary | ICD-10-CM | POA: Diagnosis not present

## 2014-08-15 DIAGNOSIS — Z720 Tobacco use: Secondary | ICD-10-CM | POA: Diagnosis not present

## 2014-08-15 DIAGNOSIS — Z89511 Acquired absence of right leg below knee: Secondary | ICD-10-CM | POA: Diagnosis not present

## 2014-08-18 DIAGNOSIS — R269 Unspecified abnormalities of gait and mobility: Secondary | ICD-10-CM | POA: Diagnosis not present

## 2014-08-18 DIAGNOSIS — Z720 Tobacco use: Secondary | ICD-10-CM | POA: Diagnosis not present

## 2014-08-18 DIAGNOSIS — M6281 Muscle weakness (generalized): Secondary | ICD-10-CM | POA: Diagnosis not present

## 2014-08-18 DIAGNOSIS — J449 Chronic obstructive pulmonary disease, unspecified: Secondary | ICD-10-CM | POA: Diagnosis not present

## 2014-08-18 DIAGNOSIS — Z9181 History of falling: Secondary | ICD-10-CM | POA: Diagnosis not present

## 2014-08-18 DIAGNOSIS — Z89511 Acquired absence of right leg below knee: Secondary | ICD-10-CM | POA: Diagnosis not present

## 2014-08-18 DIAGNOSIS — F0390 Unspecified dementia without behavioral disturbance: Secondary | ICD-10-CM | POA: Diagnosis not present

## 2014-08-18 DIAGNOSIS — L89322 Pressure ulcer of left buttock, stage 2: Secondary | ICD-10-CM | POA: Diagnosis not present

## 2014-08-18 DIAGNOSIS — Z89512 Acquired absence of left leg below knee: Secondary | ICD-10-CM | POA: Diagnosis not present

## 2014-08-19 DIAGNOSIS — Z89511 Acquired absence of right leg below knee: Secondary | ICD-10-CM | POA: Diagnosis not present

## 2014-08-19 DIAGNOSIS — J449 Chronic obstructive pulmonary disease, unspecified: Secondary | ICD-10-CM | POA: Diagnosis not present

## 2014-08-19 DIAGNOSIS — Z9181 History of falling: Secondary | ICD-10-CM | POA: Diagnosis not present

## 2014-08-19 DIAGNOSIS — M6281 Muscle weakness (generalized): Secondary | ICD-10-CM | POA: Diagnosis not present

## 2014-08-19 DIAGNOSIS — R269 Unspecified abnormalities of gait and mobility: Secondary | ICD-10-CM | POA: Diagnosis not present

## 2014-08-19 DIAGNOSIS — Z89512 Acquired absence of left leg below knee: Secondary | ICD-10-CM | POA: Diagnosis not present

## 2014-08-19 DIAGNOSIS — F0390 Unspecified dementia without behavioral disturbance: Secondary | ICD-10-CM | POA: Diagnosis not present

## 2014-08-19 DIAGNOSIS — L89322 Pressure ulcer of left buttock, stage 2: Secondary | ICD-10-CM | POA: Diagnosis not present

## 2014-08-19 DIAGNOSIS — Z720 Tobacco use: Secondary | ICD-10-CM | POA: Diagnosis not present

## 2014-08-20 DIAGNOSIS — L89322 Pressure ulcer of left buttock, stage 2: Secondary | ICD-10-CM | POA: Diagnosis not present

## 2014-08-20 DIAGNOSIS — M6281 Muscle weakness (generalized): Secondary | ICD-10-CM | POA: Diagnosis not present

## 2014-08-20 DIAGNOSIS — Z89511 Acquired absence of right leg below knee: Secondary | ICD-10-CM | POA: Diagnosis not present

## 2014-08-20 DIAGNOSIS — F0390 Unspecified dementia without behavioral disturbance: Secondary | ICD-10-CM | POA: Diagnosis not present

## 2014-08-20 DIAGNOSIS — R269 Unspecified abnormalities of gait and mobility: Secondary | ICD-10-CM | POA: Diagnosis not present

## 2014-08-20 DIAGNOSIS — Z89512 Acquired absence of left leg below knee: Secondary | ICD-10-CM | POA: Diagnosis not present

## 2014-08-20 DIAGNOSIS — Z9181 History of falling: Secondary | ICD-10-CM | POA: Diagnosis not present

## 2014-08-20 DIAGNOSIS — J449 Chronic obstructive pulmonary disease, unspecified: Secondary | ICD-10-CM | POA: Diagnosis not present

## 2014-08-20 DIAGNOSIS — Z720 Tobacco use: Secondary | ICD-10-CM | POA: Diagnosis not present

## 2014-08-21 DIAGNOSIS — R269 Unspecified abnormalities of gait and mobility: Secondary | ICD-10-CM | POA: Diagnosis not present

## 2014-08-21 DIAGNOSIS — Z9181 History of falling: Secondary | ICD-10-CM | POA: Diagnosis not present

## 2014-08-21 DIAGNOSIS — Z720 Tobacco use: Secondary | ICD-10-CM | POA: Diagnosis not present

## 2014-08-21 DIAGNOSIS — F0390 Unspecified dementia without behavioral disturbance: Secondary | ICD-10-CM | POA: Diagnosis not present

## 2014-08-21 DIAGNOSIS — Z89511 Acquired absence of right leg below knee: Secondary | ICD-10-CM | POA: Diagnosis not present

## 2014-08-21 DIAGNOSIS — Z89512 Acquired absence of left leg below knee: Secondary | ICD-10-CM | POA: Diagnosis not present

## 2014-08-21 DIAGNOSIS — J449 Chronic obstructive pulmonary disease, unspecified: Secondary | ICD-10-CM | POA: Diagnosis not present

## 2014-08-21 DIAGNOSIS — L89322 Pressure ulcer of left buttock, stage 2: Secondary | ICD-10-CM | POA: Diagnosis not present

## 2014-08-21 DIAGNOSIS — M6281 Muscle weakness (generalized): Secondary | ICD-10-CM | POA: Diagnosis not present

## 2014-08-25 DIAGNOSIS — J449 Chronic obstructive pulmonary disease, unspecified: Secondary | ICD-10-CM | POA: Diagnosis not present

## 2014-08-25 DIAGNOSIS — Z720 Tobacco use: Secondary | ICD-10-CM | POA: Diagnosis not present

## 2014-08-25 DIAGNOSIS — R269 Unspecified abnormalities of gait and mobility: Secondary | ICD-10-CM | POA: Diagnosis not present

## 2014-08-25 DIAGNOSIS — Z89511 Acquired absence of right leg below knee: Secondary | ICD-10-CM | POA: Diagnosis not present

## 2014-08-25 DIAGNOSIS — M6281 Muscle weakness (generalized): Secondary | ICD-10-CM | POA: Diagnosis not present

## 2014-08-25 DIAGNOSIS — F0390 Unspecified dementia without behavioral disturbance: Secondary | ICD-10-CM | POA: Diagnosis not present

## 2014-08-25 DIAGNOSIS — L89322 Pressure ulcer of left buttock, stage 2: Secondary | ICD-10-CM | POA: Diagnosis not present

## 2014-08-25 DIAGNOSIS — Z9181 History of falling: Secondary | ICD-10-CM | POA: Diagnosis not present

## 2014-08-25 DIAGNOSIS — Z89512 Acquired absence of left leg below knee: Secondary | ICD-10-CM | POA: Diagnosis not present

## 2014-08-26 DIAGNOSIS — Z89512 Acquired absence of left leg below knee: Secondary | ICD-10-CM | POA: Diagnosis not present

## 2014-08-26 DIAGNOSIS — L89322 Pressure ulcer of left buttock, stage 2: Secondary | ICD-10-CM | POA: Diagnosis not present

## 2014-08-26 DIAGNOSIS — M6281 Muscle weakness (generalized): Secondary | ICD-10-CM | POA: Diagnosis not present

## 2014-08-26 DIAGNOSIS — F0390 Unspecified dementia without behavioral disturbance: Secondary | ICD-10-CM | POA: Diagnosis not present

## 2014-08-26 DIAGNOSIS — J449 Chronic obstructive pulmonary disease, unspecified: Secondary | ICD-10-CM | POA: Diagnosis not present

## 2014-08-26 DIAGNOSIS — Z9181 History of falling: Secondary | ICD-10-CM | POA: Diagnosis not present

## 2014-08-26 DIAGNOSIS — R269 Unspecified abnormalities of gait and mobility: Secondary | ICD-10-CM | POA: Diagnosis not present

## 2014-08-26 DIAGNOSIS — Z89511 Acquired absence of right leg below knee: Secondary | ICD-10-CM | POA: Diagnosis not present

## 2014-08-26 DIAGNOSIS — Z720 Tobacco use: Secondary | ICD-10-CM | POA: Diagnosis not present

## 2014-08-27 DIAGNOSIS — Z89511 Acquired absence of right leg below knee: Secondary | ICD-10-CM | POA: Diagnosis not present

## 2014-08-27 DIAGNOSIS — R269 Unspecified abnormalities of gait and mobility: Secondary | ICD-10-CM | POA: Diagnosis not present

## 2014-08-27 DIAGNOSIS — F0390 Unspecified dementia without behavioral disturbance: Secondary | ICD-10-CM | POA: Diagnosis not present

## 2014-08-27 DIAGNOSIS — L89322 Pressure ulcer of left buttock, stage 2: Secondary | ICD-10-CM | POA: Diagnosis not present

## 2014-08-27 DIAGNOSIS — J449 Chronic obstructive pulmonary disease, unspecified: Secondary | ICD-10-CM | POA: Diagnosis not present

## 2014-08-27 DIAGNOSIS — Z9181 History of falling: Secondary | ICD-10-CM | POA: Diagnosis not present

## 2014-08-27 DIAGNOSIS — Z720 Tobacco use: Secondary | ICD-10-CM | POA: Diagnosis not present

## 2014-08-27 DIAGNOSIS — Z89512 Acquired absence of left leg below knee: Secondary | ICD-10-CM | POA: Diagnosis not present

## 2014-08-27 DIAGNOSIS — M6281 Muscle weakness (generalized): Secondary | ICD-10-CM | POA: Diagnosis not present

## 2014-08-28 DIAGNOSIS — R269 Unspecified abnormalities of gait and mobility: Secondary | ICD-10-CM | POA: Diagnosis not present

## 2014-08-28 DIAGNOSIS — Z89511 Acquired absence of right leg below knee: Secondary | ICD-10-CM | POA: Diagnosis not present

## 2014-08-28 DIAGNOSIS — L89322 Pressure ulcer of left buttock, stage 2: Secondary | ICD-10-CM | POA: Diagnosis not present

## 2014-08-28 DIAGNOSIS — Z720 Tobacco use: Secondary | ICD-10-CM | POA: Diagnosis not present

## 2014-08-28 DIAGNOSIS — J449 Chronic obstructive pulmonary disease, unspecified: Secondary | ICD-10-CM | POA: Diagnosis not present

## 2014-08-28 DIAGNOSIS — Z89512 Acquired absence of left leg below knee: Secondary | ICD-10-CM | POA: Diagnosis not present

## 2014-08-28 DIAGNOSIS — Z9181 History of falling: Secondary | ICD-10-CM | POA: Diagnosis not present

## 2014-08-28 DIAGNOSIS — M6281 Muscle weakness (generalized): Secondary | ICD-10-CM | POA: Diagnosis not present

## 2014-08-28 DIAGNOSIS — F0390 Unspecified dementia without behavioral disturbance: Secondary | ICD-10-CM | POA: Diagnosis not present

## 2014-09-01 DIAGNOSIS — J449 Chronic obstructive pulmonary disease, unspecified: Secondary | ICD-10-CM | POA: Diagnosis not present

## 2014-09-01 DIAGNOSIS — R269 Unspecified abnormalities of gait and mobility: Secondary | ICD-10-CM | POA: Diagnosis not present

## 2014-09-01 DIAGNOSIS — M6281 Muscle weakness (generalized): Secondary | ICD-10-CM | POA: Diagnosis not present

## 2014-09-01 DIAGNOSIS — Z9181 History of falling: Secondary | ICD-10-CM | POA: Diagnosis not present

## 2014-09-01 DIAGNOSIS — F0390 Unspecified dementia without behavioral disturbance: Secondary | ICD-10-CM | POA: Diagnosis not present

## 2014-09-01 DIAGNOSIS — Z89512 Acquired absence of left leg below knee: Secondary | ICD-10-CM | POA: Diagnosis not present

## 2014-09-01 DIAGNOSIS — L89322 Pressure ulcer of left buttock, stage 2: Secondary | ICD-10-CM | POA: Diagnosis not present

## 2014-09-01 DIAGNOSIS — Z89511 Acquired absence of right leg below knee: Secondary | ICD-10-CM | POA: Diagnosis not present

## 2014-09-01 DIAGNOSIS — Z720 Tobacco use: Secondary | ICD-10-CM | POA: Diagnosis not present

## 2014-09-02 DIAGNOSIS — Z9181 History of falling: Secondary | ICD-10-CM | POA: Diagnosis not present

## 2014-09-02 DIAGNOSIS — Z89512 Acquired absence of left leg below knee: Secondary | ICD-10-CM | POA: Diagnosis not present

## 2014-09-02 DIAGNOSIS — Z89511 Acquired absence of right leg below knee: Secondary | ICD-10-CM | POA: Diagnosis not present

## 2014-09-02 DIAGNOSIS — J449 Chronic obstructive pulmonary disease, unspecified: Secondary | ICD-10-CM | POA: Diagnosis not present

## 2014-09-02 DIAGNOSIS — F0390 Unspecified dementia without behavioral disturbance: Secondary | ICD-10-CM | POA: Diagnosis not present

## 2014-09-02 DIAGNOSIS — Z720 Tobacco use: Secondary | ICD-10-CM | POA: Diagnosis not present

## 2014-09-02 DIAGNOSIS — M6281 Muscle weakness (generalized): Secondary | ICD-10-CM | POA: Diagnosis not present

## 2014-09-02 DIAGNOSIS — R269 Unspecified abnormalities of gait and mobility: Secondary | ICD-10-CM | POA: Diagnosis not present

## 2014-09-02 DIAGNOSIS — L89322 Pressure ulcer of left buttock, stage 2: Secondary | ICD-10-CM | POA: Diagnosis not present

## 2014-09-03 DIAGNOSIS — Z89512 Acquired absence of left leg below knee: Secondary | ICD-10-CM | POA: Diagnosis not present

## 2014-09-03 DIAGNOSIS — J449 Chronic obstructive pulmonary disease, unspecified: Secondary | ICD-10-CM | POA: Diagnosis not present

## 2014-09-03 DIAGNOSIS — Z720 Tobacco use: Secondary | ICD-10-CM | POA: Diagnosis not present

## 2014-09-03 DIAGNOSIS — Z9181 History of falling: Secondary | ICD-10-CM | POA: Diagnosis not present

## 2014-09-03 DIAGNOSIS — M6281 Muscle weakness (generalized): Secondary | ICD-10-CM | POA: Diagnosis not present

## 2014-09-03 DIAGNOSIS — Z89511 Acquired absence of right leg below knee: Secondary | ICD-10-CM | POA: Diagnosis not present

## 2014-09-03 DIAGNOSIS — F0390 Unspecified dementia without behavioral disturbance: Secondary | ICD-10-CM | POA: Diagnosis not present

## 2014-09-03 DIAGNOSIS — R269 Unspecified abnormalities of gait and mobility: Secondary | ICD-10-CM | POA: Diagnosis not present

## 2014-09-03 DIAGNOSIS — L89322 Pressure ulcer of left buttock, stage 2: Secondary | ICD-10-CM | POA: Diagnosis not present

## 2014-09-04 DIAGNOSIS — Z9181 History of falling: Secondary | ICD-10-CM | POA: Diagnosis not present

## 2014-09-04 DIAGNOSIS — J449 Chronic obstructive pulmonary disease, unspecified: Secondary | ICD-10-CM | POA: Diagnosis not present

## 2014-09-04 DIAGNOSIS — Z89511 Acquired absence of right leg below knee: Secondary | ICD-10-CM | POA: Diagnosis not present

## 2014-09-04 DIAGNOSIS — Z89512 Acquired absence of left leg below knee: Secondary | ICD-10-CM | POA: Diagnosis not present

## 2014-09-04 DIAGNOSIS — R269 Unspecified abnormalities of gait and mobility: Secondary | ICD-10-CM | POA: Diagnosis not present

## 2014-09-04 DIAGNOSIS — Z720 Tobacco use: Secondary | ICD-10-CM | POA: Diagnosis not present

## 2014-09-04 DIAGNOSIS — F0390 Unspecified dementia without behavioral disturbance: Secondary | ICD-10-CM | POA: Diagnosis not present

## 2014-09-04 DIAGNOSIS — L89322 Pressure ulcer of left buttock, stage 2: Secondary | ICD-10-CM | POA: Diagnosis not present

## 2014-09-04 DIAGNOSIS — M6281 Muscle weakness (generalized): Secondary | ICD-10-CM | POA: Diagnosis not present

## 2014-09-09 DIAGNOSIS — F0390 Unspecified dementia without behavioral disturbance: Secondary | ICD-10-CM | POA: Diagnosis not present

## 2014-09-09 DIAGNOSIS — J449 Chronic obstructive pulmonary disease, unspecified: Secondary | ICD-10-CM | POA: Diagnosis not present

## 2014-09-09 DIAGNOSIS — L89322 Pressure ulcer of left buttock, stage 2: Secondary | ICD-10-CM | POA: Diagnosis not present

## 2014-09-09 DIAGNOSIS — Z89511 Acquired absence of right leg below knee: Secondary | ICD-10-CM | POA: Diagnosis not present

## 2014-09-09 DIAGNOSIS — Z9181 History of falling: Secondary | ICD-10-CM | POA: Diagnosis not present

## 2014-09-09 DIAGNOSIS — Z89512 Acquired absence of left leg below knee: Secondary | ICD-10-CM | POA: Diagnosis not present

## 2014-09-09 DIAGNOSIS — M6281 Muscle weakness (generalized): Secondary | ICD-10-CM | POA: Diagnosis not present

## 2014-09-09 DIAGNOSIS — R269 Unspecified abnormalities of gait and mobility: Secondary | ICD-10-CM | POA: Diagnosis not present

## 2014-09-09 DIAGNOSIS — Z720 Tobacco use: Secondary | ICD-10-CM | POA: Diagnosis not present

## 2014-09-11 DIAGNOSIS — L89322 Pressure ulcer of left buttock, stage 2: Secondary | ICD-10-CM | POA: Diagnosis not present

## 2014-09-11 DIAGNOSIS — F0390 Unspecified dementia without behavioral disturbance: Secondary | ICD-10-CM | POA: Diagnosis not present

## 2014-09-11 DIAGNOSIS — R269 Unspecified abnormalities of gait and mobility: Secondary | ICD-10-CM | POA: Diagnosis not present

## 2014-09-11 DIAGNOSIS — J449 Chronic obstructive pulmonary disease, unspecified: Secondary | ICD-10-CM | POA: Diagnosis not present

## 2014-09-11 DIAGNOSIS — Z89511 Acquired absence of right leg below knee: Secondary | ICD-10-CM | POA: Diagnosis not present

## 2014-09-11 DIAGNOSIS — Z9181 History of falling: Secondary | ICD-10-CM | POA: Diagnosis not present

## 2014-09-11 DIAGNOSIS — Z89512 Acquired absence of left leg below knee: Secondary | ICD-10-CM | POA: Diagnosis not present

## 2014-09-11 DIAGNOSIS — Z720 Tobacco use: Secondary | ICD-10-CM | POA: Diagnosis not present

## 2014-09-11 DIAGNOSIS — M6281 Muscle weakness (generalized): Secondary | ICD-10-CM | POA: Diagnosis not present

## 2014-09-15 DIAGNOSIS — L89322 Pressure ulcer of left buttock, stage 2: Secondary | ICD-10-CM | POA: Diagnosis not present

## 2014-09-15 DIAGNOSIS — M6281 Muscle weakness (generalized): Secondary | ICD-10-CM | POA: Diagnosis not present

## 2014-09-15 DIAGNOSIS — Z89512 Acquired absence of left leg below knee: Secondary | ICD-10-CM | POA: Diagnosis not present

## 2014-09-15 DIAGNOSIS — R269 Unspecified abnormalities of gait and mobility: Secondary | ICD-10-CM | POA: Diagnosis not present

## 2014-09-15 DIAGNOSIS — F0390 Unspecified dementia without behavioral disturbance: Secondary | ICD-10-CM | POA: Diagnosis not present

## 2014-09-15 DIAGNOSIS — Z9181 History of falling: Secondary | ICD-10-CM | POA: Diagnosis not present

## 2014-09-15 DIAGNOSIS — Z720 Tobacco use: Secondary | ICD-10-CM | POA: Diagnosis not present

## 2014-09-15 DIAGNOSIS — Z89511 Acquired absence of right leg below knee: Secondary | ICD-10-CM | POA: Diagnosis not present

## 2014-09-15 DIAGNOSIS — J449 Chronic obstructive pulmonary disease, unspecified: Secondary | ICD-10-CM | POA: Diagnosis not present

## 2014-09-16 DIAGNOSIS — Z9181 History of falling: Secondary | ICD-10-CM | POA: Diagnosis not present

## 2014-09-16 DIAGNOSIS — M6281 Muscle weakness (generalized): Secondary | ICD-10-CM | POA: Diagnosis not present

## 2014-09-16 DIAGNOSIS — F0390 Unspecified dementia without behavioral disturbance: Secondary | ICD-10-CM | POA: Diagnosis not present

## 2014-09-16 DIAGNOSIS — Z89512 Acquired absence of left leg below knee: Secondary | ICD-10-CM | POA: Diagnosis not present

## 2014-09-16 DIAGNOSIS — J449 Chronic obstructive pulmonary disease, unspecified: Secondary | ICD-10-CM | POA: Diagnosis not present

## 2014-09-16 DIAGNOSIS — R269 Unspecified abnormalities of gait and mobility: Secondary | ICD-10-CM | POA: Diagnosis not present

## 2014-09-16 DIAGNOSIS — Z89511 Acquired absence of right leg below knee: Secondary | ICD-10-CM | POA: Diagnosis not present

## 2014-09-16 DIAGNOSIS — Z720 Tobacco use: Secondary | ICD-10-CM | POA: Diagnosis not present

## 2014-09-16 DIAGNOSIS — L89322 Pressure ulcer of left buttock, stage 2: Secondary | ICD-10-CM | POA: Diagnosis not present

## 2014-09-18 DIAGNOSIS — R269 Unspecified abnormalities of gait and mobility: Secondary | ICD-10-CM | POA: Diagnosis not present

## 2014-09-18 DIAGNOSIS — L89322 Pressure ulcer of left buttock, stage 2: Secondary | ICD-10-CM | POA: Diagnosis not present

## 2014-09-18 DIAGNOSIS — Z720 Tobacco use: Secondary | ICD-10-CM | POA: Diagnosis not present

## 2014-09-18 DIAGNOSIS — Z9181 History of falling: Secondary | ICD-10-CM | POA: Diagnosis not present

## 2014-09-18 DIAGNOSIS — F0390 Unspecified dementia without behavioral disturbance: Secondary | ICD-10-CM | POA: Diagnosis not present

## 2014-09-18 DIAGNOSIS — J449 Chronic obstructive pulmonary disease, unspecified: Secondary | ICD-10-CM | POA: Diagnosis not present

## 2014-09-18 DIAGNOSIS — Z89511 Acquired absence of right leg below knee: Secondary | ICD-10-CM | POA: Diagnosis not present

## 2014-09-18 DIAGNOSIS — Z89512 Acquired absence of left leg below knee: Secondary | ICD-10-CM | POA: Diagnosis not present

## 2014-09-18 DIAGNOSIS — M6281 Muscle weakness (generalized): Secondary | ICD-10-CM | POA: Diagnosis not present

## 2014-09-19 DIAGNOSIS — Z89512 Acquired absence of left leg below knee: Secondary | ICD-10-CM | POA: Diagnosis not present

## 2014-09-19 DIAGNOSIS — L89322 Pressure ulcer of left buttock, stage 2: Secondary | ICD-10-CM | POA: Diagnosis not present

## 2014-09-19 DIAGNOSIS — Z89511 Acquired absence of right leg below knee: Secondary | ICD-10-CM | POA: Diagnosis not present

## 2014-09-19 DIAGNOSIS — Z9181 History of falling: Secondary | ICD-10-CM | POA: Diagnosis not present

## 2014-09-19 DIAGNOSIS — Z720 Tobacco use: Secondary | ICD-10-CM | POA: Diagnosis not present

## 2014-09-19 DIAGNOSIS — R269 Unspecified abnormalities of gait and mobility: Secondary | ICD-10-CM | POA: Diagnosis not present

## 2014-09-19 DIAGNOSIS — F0390 Unspecified dementia without behavioral disturbance: Secondary | ICD-10-CM | POA: Diagnosis not present

## 2014-09-19 DIAGNOSIS — M6281 Muscle weakness (generalized): Secondary | ICD-10-CM | POA: Diagnosis not present

## 2014-09-19 DIAGNOSIS — J449 Chronic obstructive pulmonary disease, unspecified: Secondary | ICD-10-CM | POA: Diagnosis not present

## 2014-09-23 DIAGNOSIS — Z89511 Acquired absence of right leg below knee: Secondary | ICD-10-CM | POA: Diagnosis not present

## 2014-09-23 DIAGNOSIS — Z89512 Acquired absence of left leg below knee: Secondary | ICD-10-CM | POA: Diagnosis not present

## 2014-09-23 DIAGNOSIS — J449 Chronic obstructive pulmonary disease, unspecified: Secondary | ICD-10-CM | POA: Diagnosis not present

## 2014-09-23 DIAGNOSIS — Z720 Tobacco use: Secondary | ICD-10-CM | POA: Diagnosis not present

## 2014-09-23 DIAGNOSIS — R269 Unspecified abnormalities of gait and mobility: Secondary | ICD-10-CM | POA: Diagnosis not present

## 2014-09-23 DIAGNOSIS — L89322 Pressure ulcer of left buttock, stage 2: Secondary | ICD-10-CM | POA: Diagnosis not present

## 2014-09-23 DIAGNOSIS — M6281 Muscle weakness (generalized): Secondary | ICD-10-CM | POA: Diagnosis not present

## 2014-09-23 DIAGNOSIS — Z9181 History of falling: Secondary | ICD-10-CM | POA: Diagnosis not present

## 2014-09-23 DIAGNOSIS — F0390 Unspecified dementia without behavioral disturbance: Secondary | ICD-10-CM | POA: Diagnosis not present

## 2014-09-25 DIAGNOSIS — Z720 Tobacco use: Secondary | ICD-10-CM | POA: Diagnosis not present

## 2014-09-25 DIAGNOSIS — J449 Chronic obstructive pulmonary disease, unspecified: Secondary | ICD-10-CM | POA: Diagnosis not present

## 2014-09-25 DIAGNOSIS — Z9181 History of falling: Secondary | ICD-10-CM | POA: Diagnosis not present

## 2014-09-25 DIAGNOSIS — M6281 Muscle weakness (generalized): Secondary | ICD-10-CM | POA: Diagnosis not present

## 2014-09-25 DIAGNOSIS — F0390 Unspecified dementia without behavioral disturbance: Secondary | ICD-10-CM | POA: Diagnosis not present

## 2014-09-25 DIAGNOSIS — R269 Unspecified abnormalities of gait and mobility: Secondary | ICD-10-CM | POA: Diagnosis not present

## 2014-09-25 DIAGNOSIS — Z89512 Acquired absence of left leg below knee: Secondary | ICD-10-CM | POA: Diagnosis not present

## 2014-09-25 DIAGNOSIS — Z89511 Acquired absence of right leg below knee: Secondary | ICD-10-CM | POA: Diagnosis not present

## 2014-09-25 DIAGNOSIS — L89322 Pressure ulcer of left buttock, stage 2: Secondary | ICD-10-CM | POA: Diagnosis not present

## 2014-09-30 DIAGNOSIS — M6281 Muscle weakness (generalized): Secondary | ICD-10-CM | POA: Diagnosis not present

## 2014-09-30 DIAGNOSIS — Z89512 Acquired absence of left leg below knee: Secondary | ICD-10-CM | POA: Diagnosis not present

## 2014-09-30 DIAGNOSIS — F0390 Unspecified dementia without behavioral disturbance: Secondary | ICD-10-CM | POA: Diagnosis not present

## 2014-09-30 DIAGNOSIS — L89322 Pressure ulcer of left buttock, stage 2: Secondary | ICD-10-CM | POA: Diagnosis not present

## 2014-09-30 DIAGNOSIS — Z9181 History of falling: Secondary | ICD-10-CM | POA: Diagnosis not present

## 2014-09-30 DIAGNOSIS — Z720 Tobacco use: Secondary | ICD-10-CM | POA: Diagnosis not present

## 2014-09-30 DIAGNOSIS — Z89511 Acquired absence of right leg below knee: Secondary | ICD-10-CM | POA: Diagnosis not present

## 2014-09-30 DIAGNOSIS — R269 Unspecified abnormalities of gait and mobility: Secondary | ICD-10-CM | POA: Diagnosis not present

## 2014-09-30 DIAGNOSIS — J449 Chronic obstructive pulmonary disease, unspecified: Secondary | ICD-10-CM | POA: Diagnosis not present

## 2014-10-02 DIAGNOSIS — L89322 Pressure ulcer of left buttock, stage 2: Secondary | ICD-10-CM | POA: Diagnosis not present

## 2014-10-02 DIAGNOSIS — Z720 Tobacco use: Secondary | ICD-10-CM | POA: Diagnosis not present

## 2014-10-02 DIAGNOSIS — J449 Chronic obstructive pulmonary disease, unspecified: Secondary | ICD-10-CM | POA: Diagnosis not present

## 2014-10-02 DIAGNOSIS — R269 Unspecified abnormalities of gait and mobility: Secondary | ICD-10-CM | POA: Diagnosis not present

## 2014-10-02 DIAGNOSIS — M6281 Muscle weakness (generalized): Secondary | ICD-10-CM | POA: Diagnosis not present

## 2014-10-02 DIAGNOSIS — Z89511 Acquired absence of right leg below knee: Secondary | ICD-10-CM | POA: Diagnosis not present

## 2014-10-02 DIAGNOSIS — F0390 Unspecified dementia without behavioral disturbance: Secondary | ICD-10-CM | POA: Diagnosis not present

## 2014-10-02 DIAGNOSIS — Z9181 History of falling: Secondary | ICD-10-CM | POA: Diagnosis not present

## 2014-10-02 DIAGNOSIS — Z89512 Acquired absence of left leg below knee: Secondary | ICD-10-CM | POA: Diagnosis not present

## 2014-10-07 DIAGNOSIS — M6281 Muscle weakness (generalized): Secondary | ICD-10-CM | POA: Diagnosis not present

## 2014-10-07 DIAGNOSIS — Z9181 History of falling: Secondary | ICD-10-CM | POA: Diagnosis not present

## 2014-10-07 DIAGNOSIS — Z720 Tobacco use: Secondary | ICD-10-CM | POA: Diagnosis not present

## 2014-10-07 DIAGNOSIS — R269 Unspecified abnormalities of gait and mobility: Secondary | ICD-10-CM | POA: Diagnosis not present

## 2014-10-07 DIAGNOSIS — Z89511 Acquired absence of right leg below knee: Secondary | ICD-10-CM | POA: Diagnosis not present

## 2014-10-07 DIAGNOSIS — L89322 Pressure ulcer of left buttock, stage 2: Secondary | ICD-10-CM | POA: Diagnosis not present

## 2014-10-07 DIAGNOSIS — J449 Chronic obstructive pulmonary disease, unspecified: Secondary | ICD-10-CM | POA: Diagnosis not present

## 2014-10-07 DIAGNOSIS — Z89512 Acquired absence of left leg below knee: Secondary | ICD-10-CM | POA: Diagnosis not present

## 2014-10-07 DIAGNOSIS — F0390 Unspecified dementia without behavioral disturbance: Secondary | ICD-10-CM | POA: Diagnosis not present

## 2014-10-09 DIAGNOSIS — R269 Unspecified abnormalities of gait and mobility: Secondary | ICD-10-CM | POA: Diagnosis not present

## 2014-10-09 DIAGNOSIS — Z9181 History of falling: Secondary | ICD-10-CM | POA: Diagnosis not present

## 2014-10-09 DIAGNOSIS — F0390 Unspecified dementia without behavioral disturbance: Secondary | ICD-10-CM | POA: Diagnosis not present

## 2014-10-09 DIAGNOSIS — L89322 Pressure ulcer of left buttock, stage 2: Secondary | ICD-10-CM | POA: Diagnosis not present

## 2014-10-09 DIAGNOSIS — J449 Chronic obstructive pulmonary disease, unspecified: Secondary | ICD-10-CM | POA: Diagnosis not present

## 2014-10-09 DIAGNOSIS — Z720 Tobacco use: Secondary | ICD-10-CM | POA: Diagnosis not present

## 2014-10-09 DIAGNOSIS — M6281 Muscle weakness (generalized): Secondary | ICD-10-CM | POA: Diagnosis not present

## 2014-10-09 DIAGNOSIS — Z89511 Acquired absence of right leg below knee: Secondary | ICD-10-CM | POA: Diagnosis not present

## 2014-10-09 DIAGNOSIS — Z89512 Acquired absence of left leg below knee: Secondary | ICD-10-CM | POA: Diagnosis not present

## 2014-10-19 DIAGNOSIS — R269 Unspecified abnormalities of gait and mobility: Secondary | ICD-10-CM | POA: Diagnosis not present

## 2014-11-19 DIAGNOSIS — R269 Unspecified abnormalities of gait and mobility: Secondary | ICD-10-CM | POA: Diagnosis not present

## 2014-12-03 ENCOUNTER — Encounter: Payer: Self-pay | Admitting: Cardiovascular Disease

## 2014-12-10 DIAGNOSIS — H353133 Nonexudative age-related macular degeneration, bilateral, advanced atrophic without subfoveal involvement: Secondary | ICD-10-CM | POA: Diagnosis not present

## 2014-12-10 DIAGNOSIS — H04123 Dry eye syndrome of bilateral lacrimal glands: Secondary | ICD-10-CM | POA: Diagnosis not present

## 2014-12-10 DIAGNOSIS — E119 Type 2 diabetes mellitus without complications: Secondary | ICD-10-CM | POA: Diagnosis not present

## 2014-12-10 DIAGNOSIS — H2513 Age-related nuclear cataract, bilateral: Secondary | ICD-10-CM | POA: Diagnosis not present

## 2014-12-19 DIAGNOSIS — R269 Unspecified abnormalities of gait and mobility: Secondary | ICD-10-CM | POA: Diagnosis not present

## 2015-01-06 DIAGNOSIS — M25511 Pain in right shoulder: Secondary | ICD-10-CM | POA: Diagnosis not present

## 2015-01-06 DIAGNOSIS — R05 Cough: Secondary | ICD-10-CM | POA: Diagnosis not present

## 2015-01-06 DIAGNOSIS — M2550 Pain in unspecified joint: Secondary | ICD-10-CM | POA: Diagnosis not present

## 2015-01-08 ENCOUNTER — Telehealth: Payer: Self-pay | Admitting: Internal Medicine

## 2015-01-08 NOTE — Telephone Encounter (Signed)
Pt  Daughter [name]([name])  is looking for a new provider but would like to meet with dr Para Marchduncan before making a new patient appointment.  She understand dr Lianne Bushyduncan's new patient appointment are into next year. He has Nurse, learning disabilityaarp medicare complete

## 2015-01-08 NOTE — Telephone Encounter (Signed)
She can make a regular new patient appointment if she desires.  She can elect to continue care with me after that appointment.  

## 2015-01-14 NOTE — Telephone Encounter (Signed)
Pt aware.

## 2015-01-19 DIAGNOSIS — R269 Unspecified abnormalities of gait and mobility: Secondary | ICD-10-CM | POA: Diagnosis not present

## 2015-01-20 DIAGNOSIS — F17200 Nicotine dependence, unspecified, uncomplicated: Secondary | ICD-10-CM | POA: Diagnosis not present

## 2015-01-20 DIAGNOSIS — K219 Gastro-esophageal reflux disease without esophagitis: Secondary | ICD-10-CM | POA: Diagnosis not present

## 2015-01-20 DIAGNOSIS — I10 Essential (primary) hypertension: Secondary | ICD-10-CM | POA: Diagnosis not present

## 2015-01-20 DIAGNOSIS — E7800 Pure hypercholesterolemia, unspecified: Secondary | ICD-10-CM | POA: Diagnosis not present

## 2015-02-18 DIAGNOSIS — R269 Unspecified abnormalities of gait and mobility: Secondary | ICD-10-CM | POA: Diagnosis not present

## 2015-03-21 DIAGNOSIS — R269 Unspecified abnormalities of gait and mobility: Secondary | ICD-10-CM | POA: Diagnosis not present

## 2015-04-21 DIAGNOSIS — R269 Unspecified abnormalities of gait and mobility: Secondary | ICD-10-CM | POA: Diagnosis not present

## 2015-05-01 DIAGNOSIS — Z720 Tobacco use: Secondary | ICD-10-CM | POA: Diagnosis not present

## 2015-05-01 DIAGNOSIS — R413 Other amnesia: Secondary | ICD-10-CM | POA: Diagnosis not present

## 2015-05-19 DIAGNOSIS — R269 Unspecified abnormalities of gait and mobility: Secondary | ICD-10-CM | POA: Diagnosis not present

## 2015-05-25 DIAGNOSIS — Z0001 Encounter for general adult medical examination with abnormal findings: Secondary | ICD-10-CM | POA: Diagnosis not present

## 2015-05-25 DIAGNOSIS — E7800 Pure hypercholesterolemia, unspecified: Secondary | ICD-10-CM | POA: Diagnosis not present

## 2015-05-25 DIAGNOSIS — Z13220 Encounter for screening for lipoid disorders: Secondary | ICD-10-CM | POA: Diagnosis not present

## 2015-05-25 DIAGNOSIS — E119 Type 2 diabetes mellitus without complications: Secondary | ICD-10-CM | POA: Diagnosis not present

## 2015-05-25 DIAGNOSIS — I10 Essential (primary) hypertension: Secondary | ICD-10-CM | POA: Diagnosis not present

## 2015-05-29 DIAGNOSIS — M542 Cervicalgia: Secondary | ICD-10-CM | POA: Diagnosis not present

## 2015-05-29 DIAGNOSIS — R413 Other amnesia: Secondary | ICD-10-CM | POA: Diagnosis not present

## 2015-06-10 DIAGNOSIS — E119 Type 2 diabetes mellitus without complications: Secondary | ICD-10-CM | POA: Diagnosis not present

## 2015-06-10 DIAGNOSIS — H353133 Nonexudative age-related macular degeneration, bilateral, advanced atrophic without subfoveal involvement: Secondary | ICD-10-CM | POA: Diagnosis not present

## 2015-06-10 DIAGNOSIS — H2513 Age-related nuclear cataract, bilateral: Secondary | ICD-10-CM | POA: Diagnosis not present

## 2015-06-10 DIAGNOSIS — H04123 Dry eye syndrome of bilateral lacrimal glands: Secondary | ICD-10-CM | POA: Diagnosis not present

## 2015-06-10 DIAGNOSIS — H02831 Dermatochalasis of right upper eyelid: Secondary | ICD-10-CM | POA: Diagnosis not present

## 2015-06-19 DIAGNOSIS — R269 Unspecified abnormalities of gait and mobility: Secondary | ICD-10-CM | POA: Diagnosis not present

## 2015-10-12 DIAGNOSIS — Z23 Encounter for immunization: Secondary | ICD-10-CM | POA: Diagnosis not present

## 2015-10-14 DIAGNOSIS — G4700 Insomnia, unspecified: Secondary | ICD-10-CM | POA: Diagnosis not present

## 2015-10-14 DIAGNOSIS — R5381 Other malaise: Secondary | ICD-10-CM | POA: Diagnosis not present

## 2015-10-14 DIAGNOSIS — J449 Chronic obstructive pulmonary disease, unspecified: Secondary | ICD-10-CM | POA: Diagnosis not present

## 2015-10-27 DIAGNOSIS — M6281 Muscle weakness (generalized): Secondary | ICD-10-CM | POA: Diagnosis not present

## 2015-10-27 DIAGNOSIS — M25512 Pain in left shoulder: Secondary | ICD-10-CM | POA: Diagnosis not present

## 2015-10-27 DIAGNOSIS — M25511 Pain in right shoulder: Secondary | ICD-10-CM | POA: Diagnosis not present

## 2015-10-29 DIAGNOSIS — M25512 Pain in left shoulder: Secondary | ICD-10-CM | POA: Diagnosis not present

## 2015-10-29 DIAGNOSIS — M25511 Pain in right shoulder: Secondary | ICD-10-CM | POA: Diagnosis not present

## 2015-10-29 DIAGNOSIS — M6281 Muscle weakness (generalized): Secondary | ICD-10-CM | POA: Diagnosis not present

## 2015-11-02 DIAGNOSIS — M25512 Pain in left shoulder: Secondary | ICD-10-CM | POA: Diagnosis not present

## 2015-11-02 DIAGNOSIS — M25511 Pain in right shoulder: Secondary | ICD-10-CM | POA: Diagnosis not present

## 2015-11-02 DIAGNOSIS — M6281 Muscle weakness (generalized): Secondary | ICD-10-CM | POA: Diagnosis not present

## 2015-11-03 DIAGNOSIS — M6281 Muscle weakness (generalized): Secondary | ICD-10-CM | POA: Diagnosis not present

## 2015-11-03 DIAGNOSIS — M25511 Pain in right shoulder: Secondary | ICD-10-CM | POA: Diagnosis not present

## 2015-11-03 DIAGNOSIS — M25512 Pain in left shoulder: Secondary | ICD-10-CM | POA: Diagnosis not present

## 2015-11-05 DIAGNOSIS — M25511 Pain in right shoulder: Secondary | ICD-10-CM | POA: Diagnosis not present

## 2015-11-05 DIAGNOSIS — M25512 Pain in left shoulder: Secondary | ICD-10-CM | POA: Diagnosis not present

## 2015-11-05 DIAGNOSIS — M6281 Muscle weakness (generalized): Secondary | ICD-10-CM | POA: Diagnosis not present

## 2015-11-11 DIAGNOSIS — M6281 Muscle weakness (generalized): Secondary | ICD-10-CM | POA: Diagnosis not present

## 2015-11-11 DIAGNOSIS — M25512 Pain in left shoulder: Secondary | ICD-10-CM | POA: Diagnosis not present

## 2015-11-11 DIAGNOSIS — M25511 Pain in right shoulder: Secondary | ICD-10-CM | POA: Diagnosis not present

## 2015-11-13 DIAGNOSIS — M25512 Pain in left shoulder: Secondary | ICD-10-CM | POA: Diagnosis not present

## 2015-11-13 DIAGNOSIS — M6281 Muscle weakness (generalized): Secondary | ICD-10-CM | POA: Diagnosis not present

## 2015-11-13 DIAGNOSIS — M25511 Pain in right shoulder: Secondary | ICD-10-CM | POA: Diagnosis not present

## 2015-11-17 DIAGNOSIS — M6281 Muscle weakness (generalized): Secondary | ICD-10-CM | POA: Diagnosis not present

## 2015-11-17 DIAGNOSIS — M25512 Pain in left shoulder: Secondary | ICD-10-CM | POA: Diagnosis not present

## 2015-11-17 DIAGNOSIS — M25511 Pain in right shoulder: Secondary | ICD-10-CM | POA: Diagnosis not present

## 2015-11-19 DIAGNOSIS — M6281 Muscle weakness (generalized): Secondary | ICD-10-CM | POA: Diagnosis not present

## 2015-11-19 DIAGNOSIS — M25512 Pain in left shoulder: Secondary | ICD-10-CM | POA: Diagnosis not present

## 2015-11-19 DIAGNOSIS — M25511 Pain in right shoulder: Secondary | ICD-10-CM | POA: Diagnosis not present

## 2015-11-21 ENCOUNTER — Inpatient Hospital Stay (HOSPITAL_COMMUNITY): Payer: Medicare Other

## 2015-11-21 ENCOUNTER — Inpatient Hospital Stay (HOSPITAL_COMMUNITY)
Admission: EM | Admit: 2015-11-21 | Discharge: 2015-12-06 | DRG: 871 | Disposition: E | Payer: Medicare Other | Attending: Internal Medicine | Admitting: Internal Medicine

## 2015-11-21 ENCOUNTER — Emergency Department (HOSPITAL_COMMUNITY): Payer: Medicare Other

## 2015-11-21 ENCOUNTER — Encounter (HOSPITAL_COMMUNITY): Payer: Self-pay | Admitting: Emergency Medicine

## 2015-11-21 DIAGNOSIS — R740 Nonspecific elevation of levels of transaminase and lactic acid dehydrogenase [LDH]: Secondary | ICD-10-CM | POA: Diagnosis present

## 2015-11-21 DIAGNOSIS — I6529 Occlusion and stenosis of unspecified carotid artery: Secondary | ICD-10-CM | POA: Diagnosis present

## 2015-11-21 DIAGNOSIS — E872 Acidosis: Secondary | ICD-10-CM | POA: Diagnosis not present

## 2015-11-21 DIAGNOSIS — Z789 Other specified health status: Secondary | ICD-10-CM | POA: Diagnosis not present

## 2015-11-21 DIAGNOSIS — J441 Chronic obstructive pulmonary disease with (acute) exacerbation: Secondary | ICD-10-CM | POA: Diagnosis not present

## 2015-11-21 DIAGNOSIS — N184 Chronic kidney disease, stage 4 (severe): Secondary | ICD-10-CM | POA: Diagnosis not present

## 2015-11-21 DIAGNOSIS — G9341 Metabolic encephalopathy: Secondary | ICD-10-CM | POA: Diagnosis not present

## 2015-11-21 DIAGNOSIS — N179 Acute kidney failure, unspecified: Secondary | ICD-10-CM | POA: Diagnosis not present

## 2015-11-21 DIAGNOSIS — R0600 Dyspnea, unspecified: Secondary | ICD-10-CM | POA: Diagnosis not present

## 2015-11-21 DIAGNOSIS — E1151 Type 2 diabetes mellitus with diabetic peripheral angiopathy without gangrene: Secondary | ICD-10-CM | POA: Diagnosis present

## 2015-11-21 DIAGNOSIS — I639 Cerebral infarction, unspecified: Secondary | ICD-10-CM | POA: Diagnosis not present

## 2015-11-21 DIAGNOSIS — R0602 Shortness of breath: Secondary | ICD-10-CM

## 2015-11-21 DIAGNOSIS — R6521 Severe sepsis with septic shock: Secondary | ICD-10-CM | POA: Diagnosis not present

## 2015-11-21 DIAGNOSIS — I481 Persistent atrial fibrillation: Secondary | ICD-10-CM | POA: Diagnosis not present

## 2015-11-21 DIAGNOSIS — I509 Heart failure, unspecified: Secondary | ICD-10-CM | POA: Diagnosis not present

## 2015-11-21 DIAGNOSIS — J9692 Respiratory failure, unspecified with hypercapnia: Secondary | ICD-10-CM | POA: Diagnosis not present

## 2015-11-21 DIAGNOSIS — N281 Cyst of kidney, acquired: Secondary | ICD-10-CM | POA: Diagnosis not present

## 2015-11-21 DIAGNOSIS — R7989 Other specified abnormal findings of blood chemistry: Secondary | ICD-10-CM

## 2015-11-21 DIAGNOSIS — Z681 Body mass index (BMI) 19 or less, adult: Secondary | ICD-10-CM

## 2015-11-21 DIAGNOSIS — A419 Sepsis, unspecified organism: Secondary | ICD-10-CM | POA: Diagnosis not present

## 2015-11-21 DIAGNOSIS — I130 Hypertensive heart and chronic kidney disease with heart failure and stage 1 through stage 4 chronic kidney disease, or unspecified chronic kidney disease: Secondary | ICD-10-CM | POA: Diagnosis present

## 2015-11-21 DIAGNOSIS — E440 Moderate protein-calorie malnutrition: Secondary | ICD-10-CM | POA: Diagnosis not present

## 2015-11-21 DIAGNOSIS — Z91041 Radiographic dye allergy status: Secondary | ICD-10-CM

## 2015-11-21 DIAGNOSIS — I248 Other forms of acute ischemic heart disease: Secondary | ICD-10-CM | POA: Diagnosis present

## 2015-11-21 DIAGNOSIS — J9601 Acute respiratory failure with hypoxia: Secondary | ICD-10-CM | POA: Diagnosis not present

## 2015-11-21 DIAGNOSIS — Z8673 Personal history of transient ischemic attack (TIA), and cerebral infarction without residual deficits: Secondary | ICD-10-CM

## 2015-11-21 DIAGNOSIS — I2510 Atherosclerotic heart disease of native coronary artery without angina pectoris: Secondary | ICD-10-CM

## 2015-11-21 DIAGNOSIS — F0390 Unspecified dementia without behavioral disturbance: Secondary | ICD-10-CM | POA: Diagnosis present

## 2015-11-21 DIAGNOSIS — E1122 Type 2 diabetes mellitus with diabetic chronic kidney disease: Secondary | ICD-10-CM | POA: Diagnosis present

## 2015-11-21 DIAGNOSIS — N19 Unspecified kidney failure: Secondary | ICD-10-CM

## 2015-11-21 DIAGNOSIS — Z66 Do not resuscitate: Secondary | ICD-10-CM | POA: Diagnosis not present

## 2015-11-21 DIAGNOSIS — J189 Pneumonia, unspecified organism: Secondary | ICD-10-CM | POA: Diagnosis present

## 2015-11-21 DIAGNOSIS — K219 Gastro-esophageal reflux disease without esophagitis: Secondary | ICD-10-CM | POA: Diagnosis present

## 2015-11-21 DIAGNOSIS — F17210 Nicotine dependence, cigarettes, uncomplicated: Secondary | ICD-10-CM | POA: Diagnosis present

## 2015-11-21 DIAGNOSIS — E861 Hypovolemia: Secondary | ICD-10-CM | POA: Diagnosis present

## 2015-11-21 DIAGNOSIS — E1165 Type 2 diabetes mellitus with hyperglycemia: Secondary | ICD-10-CM | POA: Diagnosis present

## 2015-11-21 DIAGNOSIS — I255 Ischemic cardiomyopathy: Secondary | ICD-10-CM | POA: Diagnosis present

## 2015-11-21 DIAGNOSIS — R1310 Dysphagia, unspecified: Secondary | ICD-10-CM | POA: Diagnosis present

## 2015-11-21 DIAGNOSIS — R069 Unspecified abnormalities of breathing: Secondary | ICD-10-CM | POA: Diagnosis not present

## 2015-11-21 DIAGNOSIS — D696 Thrombocytopenia, unspecified: Secondary | ICD-10-CM | POA: Diagnosis present

## 2015-11-21 DIAGNOSIS — I4819 Other persistent atrial fibrillation: Secondary | ICD-10-CM

## 2015-11-21 DIAGNOSIS — Z4682 Encounter for fitting and adjustment of non-vascular catheter: Secondary | ICD-10-CM | POA: Diagnosis not present

## 2015-11-21 DIAGNOSIS — M6281 Muscle weakness (generalized): Secondary | ICD-10-CM

## 2015-11-21 DIAGNOSIS — G40901 Epilepsy, unspecified, not intractable, with status epilepticus: Secondary | ICD-10-CM | POA: Diagnosis not present

## 2015-11-21 DIAGNOSIS — Z89511 Acquired absence of right leg below knee: Secondary | ICD-10-CM

## 2015-11-21 DIAGNOSIS — J9600 Acute respiratory failure, unspecified whether with hypoxia or hypercapnia: Secondary | ICD-10-CM | POA: Diagnosis present

## 2015-11-21 DIAGNOSIS — R05 Cough: Secondary | ICD-10-CM | POA: Diagnosis not present

## 2015-11-21 DIAGNOSIS — Z89512 Acquired absence of left leg below knee: Secondary | ICD-10-CM

## 2015-11-21 DIAGNOSIS — E870 Hyperosmolality and hypernatremia: Secondary | ICD-10-CM | POA: Diagnosis not present

## 2015-11-21 DIAGNOSIS — N400 Enlarged prostate without lower urinary tract symptoms: Secondary | ICD-10-CM | POA: Diagnosis not present

## 2015-11-21 DIAGNOSIS — J90 Pleural effusion, not elsewhere classified: Secondary | ICD-10-CM | POA: Diagnosis not present

## 2015-11-21 DIAGNOSIS — I252 Old myocardial infarction: Secondary | ICD-10-CM

## 2015-11-21 DIAGNOSIS — R569 Unspecified convulsions: Secondary | ICD-10-CM

## 2015-11-21 DIAGNOSIS — M25511 Pain in right shoulder: Secondary | ICD-10-CM | POA: Diagnosis not present

## 2015-11-21 DIAGNOSIS — I480 Paroxysmal atrial fibrillation: Secondary | ICD-10-CM | POA: Diagnosis not present

## 2015-11-21 DIAGNOSIS — N189 Chronic kidney disease, unspecified: Secondary | ICD-10-CM

## 2015-11-21 DIAGNOSIS — I714 Abdominal aortic aneurysm, without rupture: Secondary | ICD-10-CM | POA: Diagnosis present

## 2015-11-21 DIAGNOSIS — M25512 Pain in left shoulder: Secondary | ICD-10-CM | POA: Diagnosis not present

## 2015-11-21 DIAGNOSIS — J9602 Acute respiratory failure with hypercapnia: Secondary | ICD-10-CM | POA: Diagnosis present

## 2015-11-21 DIAGNOSIS — Z515 Encounter for palliative care: Secondary | ICD-10-CM

## 2015-11-21 DIAGNOSIS — G9340 Encephalopathy, unspecified: Secondary | ICD-10-CM | POA: Diagnosis not present

## 2015-11-21 DIAGNOSIS — E1169 Type 2 diabetes mellitus with other specified complication: Secondary | ICD-10-CM | POA: Diagnosis present

## 2015-11-21 DIAGNOSIS — I4891 Unspecified atrial fibrillation: Secondary | ICD-10-CM | POA: Diagnosis not present

## 2015-11-21 DIAGNOSIS — E785 Hyperlipidemia, unspecified: Secondary | ICD-10-CM | POA: Diagnosis present

## 2015-11-21 DIAGNOSIS — Y95 Nosocomial condition: Secondary | ICD-10-CM | POA: Diagnosis present

## 2015-11-21 DIAGNOSIS — R0902 Hypoxemia: Secondary | ICD-10-CM

## 2015-11-21 DIAGNOSIS — N170 Acute kidney failure with tubular necrosis: Secondary | ICD-10-CM | POA: Diagnosis not present

## 2015-11-21 DIAGNOSIS — N183 Chronic kidney disease, stage 3 (moderate): Secondary | ICD-10-CM | POA: Diagnosis present

## 2015-11-21 DIAGNOSIS — J9690 Respiratory failure, unspecified, unspecified whether with hypoxia or hypercapnia: Secondary | ICD-10-CM | POA: Diagnosis not present

## 2015-11-21 DIAGNOSIS — Z978 Presence of other specified devices: Secondary | ICD-10-CM

## 2015-11-21 DIAGNOSIS — J9811 Atelectasis: Secondary | ICD-10-CM | POA: Diagnosis not present

## 2015-11-21 DIAGNOSIS — Z951 Presence of aortocoronary bypass graft: Secondary | ICD-10-CM

## 2015-11-21 DIAGNOSIS — R55 Syncope and collapse: Secondary | ICD-10-CM | POA: Diagnosis not present

## 2015-11-21 DIAGNOSIS — J440 Chronic obstructive pulmonary disease with acute lower respiratory infection: Secondary | ICD-10-CM | POA: Diagnosis not present

## 2015-11-21 DIAGNOSIS — R945 Abnormal results of liver function studies: Secondary | ICD-10-CM

## 2015-11-21 HISTORY — DX: Atherosclerotic heart disease of native coronary artery without angina pectoris: I25.10

## 2015-11-21 HISTORY — DX: Unspecified dementia, unspecified severity, without behavioral disturbance, psychotic disturbance, mood disturbance, and anxiety: F03.90

## 2015-11-21 LAB — BLOOD GAS, ARTERIAL
Acid-base deficit: 7.1 mmol/L — ABNORMAL HIGH (ref 0.0–2.0)
Acid-base deficit: 8.6 mmol/L — ABNORMAL HIGH (ref 0.0–2.0)
BICARBONATE: 22.2 mmol/L (ref 20.0–28.0)
BICARBONATE: 18.9 mmol/L — AB (ref 20.0–28.0)
Expiratory PAP: 6
FIO2: 1
FIO2: 100
INSPIRATORY PAP: 14
LHR: 26 {breaths}/min
Mode: POSITIVE
O2 Saturation: 97.8 %
O2 Saturation: 98.7 %
PEEP: 8 cmH2O
PO2 ART: 179 mmHg — AB (ref 83.0–108.0)
Patient temperature: 98.6
Patient temperature: 96.7
RATE: 8 resp/min
VT: 500 mL
pCO2 arterial: 63.6 mmHg — ABNORMAL HIGH (ref 32.0–48.0)
pCO2 arterial: 54.7 mmHg — ABNORMAL HIGH (ref 32.0–48.0)
pH, Arterial: 7.168 — CL (ref 7.350–7.450)
pH, Arterial: 7.157 — CL (ref 7.350–7.450)
pO2, Arterial: 299 mmHg — ABNORMAL HIGH (ref 83.0–108.0)

## 2015-11-21 LAB — I-STAT TROPONIN, ED: Troponin i, poc: 1.24 ng/mL (ref 0.00–0.08)

## 2015-11-21 LAB — URINALYSIS, ROUTINE W REFLEX MICROSCOPIC
Glucose, UA: NEGATIVE mg/dL
Hgb urine dipstick: NEGATIVE
Ketones, ur: NEGATIVE mg/dL
LEUKOCYTES UA: NEGATIVE
NITRITE: NEGATIVE
PROTEIN: 30 mg/dL — AB
Specific Gravity, Urine: 1.02 (ref 1.005–1.030)
pH: 5 (ref 5.0–8.0)

## 2015-11-21 LAB — BLOOD GAS, VENOUS
Acid-base deficit: 5.7 mmol/L — ABNORMAL HIGH (ref 0.0–2.0)
Bicarbonate: 25.4 mmol/L (ref 20.0–28.0)
O2 Content: 8 L/min
O2 Saturation: 53.9 %
PCO2 VEN: 80.3 mmHg — AB (ref 44.0–60.0)
PH VEN: 7.128 — AB (ref 7.250–7.430)
Patient temperature: 98.6
pO2, Ven: 41.2 mmHg (ref 32.0–45.0)

## 2015-11-21 LAB — CBC WITH DIFFERENTIAL/PLATELET
Basophils Absolute: 0 10*3/uL (ref 0.0–0.1)
Basophils Relative: 0 %
EOS ABS: 0 10*3/uL (ref 0.0–0.7)
Eosinophils Relative: 0 %
HEMATOCRIT: 43.9 % (ref 39.0–52.0)
HEMOGLOBIN: 14.1 g/dL (ref 13.0–17.0)
LYMPHS ABS: 0.8 10*3/uL (ref 0.7–4.0)
Lymphocytes Relative: 9 %
MCH: 32.4 pg (ref 26.0–34.0)
MCHC: 32.1 g/dL (ref 30.0–36.0)
MCV: 100.9 fL — ABNORMAL HIGH (ref 78.0–100.0)
MONO ABS: 0.6 10*3/uL (ref 0.1–1.0)
MONOS PCT: 8 %
NEUTROS ABS: 6.9 10*3/uL (ref 1.7–7.7)
NEUTROS PCT: 83 %
Platelets: 152 10*3/uL (ref 150–400)
RBC: 4.35 MIL/uL (ref 4.22–5.81)
RDW: 14.4 % (ref 11.5–15.5)
WBC: 8.3 10*3/uL (ref 4.0–10.5)

## 2015-11-21 LAB — COMPREHENSIVE METABOLIC PANEL
ALBUMIN: 3.6 g/dL (ref 3.5–5.0)
ALK PHOS: 110 U/L (ref 38–126)
ALT: 2108 U/L — AB (ref 17–63)
AST: 3426 U/L — ABNORMAL HIGH (ref 15–41)
Anion gap: 15 (ref 5–15)
BUN: 86 mg/dL — ABNORMAL HIGH (ref 6–20)
CALCIUM: 8.3 mg/dL — AB (ref 8.9–10.3)
CHLORIDE: 102 mmol/L (ref 101–111)
CO2: 24 mmol/L (ref 22–32)
CREATININE: 5.13 mg/dL — AB (ref 0.61–1.24)
GFR calc non Af Amer: 10 mL/min — ABNORMAL LOW (ref >60)
GFR, EST AFRICAN AMERICAN: 11 mL/min — AB (ref >60)
GLUCOSE: 183 mg/dL — AB (ref 65–99)
Potassium: 4.7 mmol/L (ref 3.5–5.1)
SODIUM: 141 mmol/L (ref 135–145)
Total Bilirubin: 1.1 mg/dL (ref 0.3–1.2)
Total Protein: 7.4 g/dL (ref 6.5–8.1)

## 2015-11-21 LAB — LIPASE, BLOOD: LIPASE: 37 U/L (ref 11–51)

## 2015-11-21 LAB — URINE MICROSCOPIC-ADD ON
RBC / HPF: NONE SEEN RBC/hpf (ref 0–5)
WBC, UA: NONE SEEN WBC/hpf (ref 0–5)

## 2015-11-21 LAB — MRSA PCR SCREENING: MRSA BY PCR: NEGATIVE

## 2015-11-21 LAB — I-STAT CG4 LACTIC ACID, ED: LACTIC ACID, VENOUS: 2.01 mmol/L — AB (ref 0.5–1.9)

## 2015-11-21 LAB — CBG MONITORING, ED: GLUCOSE-CAPILLARY: 223 mg/dL — AB (ref 65–99)

## 2015-11-21 MED ORDER — CHLORHEXIDINE GLUCONATE 0.12% ORAL RINSE (MEDLINE KIT)
15.0000 mL | Freq: Two times a day (BID) | OROMUCOSAL | Status: DC
  Administered 2015-11-21 – 2015-11-25 (×7): 15 mL via OROMUCOSAL

## 2015-11-21 MED ORDER — IPRATROPIUM-ALBUTEROL 0.5-2.5 (3) MG/3ML IN SOLN
3.0000 mL | Freq: Four times a day (QID) | RESPIRATORY_TRACT | Status: DC
  Administered 2015-11-21 – 2015-11-23 (×10): 3 mL via RESPIRATORY_TRACT
  Filled 2015-11-21 (×10): qty 3

## 2015-11-21 MED ORDER — VANCOMYCIN HCL IN DEXTROSE 1-5 GM/200ML-% IV SOLN
1000.0000 mg | INTRAVENOUS | Status: DC
  Filled 2015-11-21: qty 200

## 2015-11-21 MED ORDER — ASPIRIN 300 MG RE SUPP
300.0000 mg | Freq: Once | RECTAL | Status: AC
  Administered 2015-11-21: 300 mg via RECTAL
  Filled 2015-11-21: qty 1

## 2015-11-21 MED ORDER — MIDAZOLAM HCL 2 MG/2ML IJ SOLN
1.0000 mg | INTRAMUSCULAR | Status: AC | PRN
  Administered 2015-11-21 – 2015-11-22 (×3): 1 mg via INTRAVENOUS
  Filled 2015-11-21 (×3): qty 2

## 2015-11-21 MED ORDER — SODIUM CHLORIDE 0.9 % IV BOLUS (SEPSIS)
500.0000 mL | Freq: Once | INTRAVENOUS | Status: AC
  Administered 2015-11-21: 500 mL via INTRAVENOUS

## 2015-11-21 MED ORDER — METHYLPREDNISOLONE SODIUM SUCC 125 MG IJ SOLR
125.0000 mg | Freq: Once | INTRAMUSCULAR | Status: AC
  Administered 2015-11-21: 125 mg via INTRAVENOUS
  Filled 2015-11-21: qty 2

## 2015-11-21 MED ORDER — SODIUM CHLORIDE 0.9 % IV BOLUS (SEPSIS): 500.0000 mL | Freq: Once | INTRAVENOUS | Status: DC

## 2015-11-21 MED ORDER — SODIUM CHLORIDE 0.9 % IV SOLN: 250.0000 mL | INTRAVENOUS | Status: DC | PRN

## 2015-11-21 MED ORDER — DEXTROSE 5 % IV SOLN
1.0000 g | INTRAVENOUS | Status: DC
  Administered 2015-11-22: 1 g via INTRAVENOUS
  Filled 2015-11-21 (×2): qty 1

## 2015-11-21 MED ORDER — HEPARIN SODIUM (PORCINE) 5000 UNIT/ML IJ SOLN
5000.0000 [IU] | Freq: Three times a day (TID) | INTRAMUSCULAR | Status: DC
  Administered 2015-11-21 – 2015-12-02 (×33): 5000 [IU] via SUBCUTANEOUS
  Filled 2015-11-21 (×28): qty 1

## 2015-11-21 MED ORDER — MIDAZOLAM HCL 2 MG/2ML IJ SOLN
2.0000 mg | Freq: Once | INTRAMUSCULAR | Status: AC
Start: 2015-11-21 — End: 2015-11-21
  Administered 2015-11-21: 2 mg via INTRAVENOUS

## 2015-11-21 MED ORDER — ALBUTEROL (5 MG/ML) CONTINUOUS INHALATION SOLN
10.0000 mg/h | INHALATION_SOLUTION | Freq: Once | RESPIRATORY_TRACT | Status: AC
  Administered 2015-11-21: 10 mg/h via RESPIRATORY_TRACT
  Filled 2015-11-21: qty 20

## 2015-11-21 MED ORDER — PANTOPRAZOLE SODIUM 40 MG PO PACK
40.0000 mg | PACK | Freq: Every day | ORAL | Status: DC
  Administered 2015-11-22 – 2015-11-26 (×2): 40 mg
  Filled 2015-11-21 (×3): qty 20

## 2015-11-21 MED ORDER — MIDAZOLAM HCL 2 MG/2ML IJ SOLN
1.0000 mg | INTRAMUSCULAR | Status: DC | PRN
  Administered 2015-11-22 – 2015-11-23 (×3): 1 mg via INTRAVENOUS
  Filled 2015-11-21 (×4): qty 2

## 2015-11-21 MED ORDER — VANCOMYCIN HCL IN DEXTROSE 1-5 GM/200ML-% IV SOLN
1000.0000 mg | Freq: Once | INTRAVENOUS | Status: AC
  Administered 2015-11-21: 1000 mg via INTRAVENOUS
  Filled 2015-11-21: qty 200

## 2015-11-21 MED ORDER — PROPOFOL 1000 MG/100ML IV EMUL
INTRAVENOUS | Status: AC
  Filled 2015-11-21: qty 100

## 2015-11-21 MED ORDER — SODIUM CHLORIDE 0.9 % IV SOLN
INTRAVENOUS | Status: DC
  Administered 2015-11-21: 13:00:00 via INTRAVENOUS

## 2015-11-21 MED ORDER — FAMOTIDINE IN NACL 20-0.9 MG/50ML-% IV SOLN
20.0000 mg | Freq: Two times a day (BID) | INTRAVENOUS | Status: DC
  Administered 2015-11-21 – 2015-11-22 (×4): 20 mg via INTRAVENOUS
  Filled 2015-11-21 (×4): qty 50

## 2015-11-21 MED ORDER — DEXTROSE 5 % IV SOLN
0.0000 ug/min | INTRAVENOUS | Status: DC
  Administered 2015-11-21: 2 ug/min via INTRAVENOUS
  Administered 2015-11-22: 1 ug/min via INTRAVENOUS
  Filled 2015-11-21 (×2): qty 4

## 2015-11-21 MED ORDER — FENTANYL CITRATE (PF) 100 MCG/2ML IJ SOLN
50.0000 ug | INTRAMUSCULAR | Status: DC | PRN
  Administered 2015-11-21: 50 ug via INTRAVENOUS
  Filled 2015-11-21: qty 2

## 2015-11-21 MED ORDER — FENTANYL CITRATE (PF) 100 MCG/2ML IJ SOLN
100.0000 ug | Freq: Once | INTRAMUSCULAR | Status: AC
  Administered 2015-11-21: 100 ug via INTRAVENOUS

## 2015-11-21 MED ORDER — ORAL CARE MOUTH RINSE
15.0000 mL | Freq: Four times a day (QID) | OROMUCOSAL | Status: DC
  Administered 2015-11-22 – 2015-11-26 (×16): 15 mL via OROMUCOSAL

## 2015-11-21 MED ORDER — ALBUTEROL SULFATE (2.5 MG/3ML) 0.083% IN NEBU
5.0000 mg | INHALATION_SOLUTION | Freq: Once | RESPIRATORY_TRACT | Status: DC
  Filled 2015-11-21: qty 6

## 2015-11-21 MED ORDER — ACETAMINOPHEN 325 MG PO TABS: 650.0000 mg | ORAL_TABLET | ORAL | Status: DC | PRN

## 2015-11-21 MED ORDER — ETOMIDATE 2 MG/ML IV SOLN
20.0000 mg | Freq: Once | INTRAVENOUS | Status: AC
  Administered 2015-11-21: 20 mg via INTRAVENOUS

## 2015-11-21 MED ORDER — DEXTROSE 5 % IV SOLN
2.0000 g | Freq: Once | INTRAVENOUS | Status: AC
  Administered 2015-11-21: 2 g via INTRAVENOUS
  Filled 2015-11-21: qty 2

## 2015-11-21 MED ORDER — METHYLPREDNISOLONE SODIUM SUCC 40 MG IJ SOLR
40.0000 mg | Freq: Two times a day (BID) | INTRAMUSCULAR | Status: DC
  Administered 2015-11-22 – 2015-11-23 (×4): 40 mg via INTRAVENOUS
  Filled 2015-11-21 (×4): qty 1

## 2015-11-21 MED ORDER — FENTANYL CITRATE (PF) 100 MCG/2ML IJ SOLN
50.0000 ug | INTRAMUSCULAR | Status: DC | PRN
  Administered 2015-11-22: 50 ug via INTRAVENOUS
  Filled 2015-11-21: qty 2

## 2015-11-21 NOTE — ED Notes (Signed)
Bryan BoringDonnie (son) contact information [phone].

## 2015-11-21 NOTE — Procedures (Signed)
Central Venous Catheter Insertion Procedure Note Maryfrances BunnellDonald G Curfman [id number] 04/17/1940  Procedure: Insertion of Central Venous Catheter Indications: Assessment of intravascular volume, Drug and/or fluid administration and Frequent blood sampling  Procedure Details Consent: Risks of procedure as well as the alternatives and risks of each were explained to the (patient/caregiver).  Consent for procedure obtained. Time Out: Verified patient identification, verified procedure, site/side was marked, verified correct patient position, special equipment/implants available, medications/allergies/relevent history reviewed, required imaging and test results available.  Performed  Maximum sterile technique was used including antiseptics, cap, gloves, gown, hand hygiene, mask and sheet. Skin prep: Chlorhexidine; local anesthetic administered A antimicrobial bonded/coated triple lumen catheter was placed in the right internal jugular vein using the Seldinger technique.  Evaluation Blood flow good Complications: No apparent complications Patient did tolerate procedure well. Chest X-ray ordered to verify placement.  CXR: pending.  Nelda BucksFEINSTEIN,DANIEL J. May 18, 2015, 5:42 PM  US shock Mcarthur Rossettianiel J. Tyson AliasFeinstein, MD, FACP Pgr: [phone] [facility]

## 2015-11-21 NOTE — ED Notes (Signed)
Condom catheter placed

## 2015-11-21 NOTE — H&P (Signed)
PULMONARY / CRITICAL CARE MEDICINE   Name: Bryan BunnellDonald G Churilla MRN: [id number] DOB: 04/02/1940    ADMISSION DATE:  12/02/2015 CONSULTATION DATE:  11/24/2015  REFERRING MD:  ED, Long MD   CHIEF COMPLAINT:  Respiratory distress  HISTORY OF PRESENT ILLNESS:   75 year old smoker and alcoholic, nursing home resident since 08/2015 brought in by EMS to ED for respiratory distress. He has a known history of COPD and CHF. There is a report of dementia and his meds show Aricept. ED evaluation showed acute respiratory acidosis on VBG and he was placed on BiPAP, he was given empiric antibiotics for left-sided infiltrate on portable fil m which have reviewed. Labs showed evidence of acute renal failure and transaminitis, mild lactic acidosis and slight elevated troponin EKG showed nonspecific T-wave abnormalities in anterolateral leads  Unable to obtain full review of systems since he is is on a BiPAP. Nursing home reports productive cough for past few days. 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 He continues to smoke and drink heavily. I note that can of beer cribed to him daily at the nursing home  PAST MEDICAL HISTORY :  He  has a past medical history of CHF (congestive heart failure) (HCC); Chronic kidney disease; COPD (chronic obstructive pulmonary disease) (HCC); Coronary artery disease; CVA (cerebral infarction); Dementia; Diabetes mellitus; Hyperlipidemia; Hypertension; Myocardial infarction (HCC); Occlusion and stenosis of carotid artery without mention of cerebral infarction; Paroxysmal a-fib (HCC); PVD (peripheral vascular disease) (HCC); and Tobacco abuse.  PAST SURGICAL HISTORY: He  has a past surgical history that includes Lt below-the-knee amputation (12/07/2009); RT below-the-knee amputation (07/31/2008); and Coronary artery bypass x3 (01/04/2002).  Allergies  Allergen Reactions  . Ibuprofen Other (See Comments)     Can't take because of KIDNEY function  . Iohexol      Desc: PT IS NOT ALLERGIC TO CONTRAST- PT HAS BEEN TOLD BY DR FOX TO NOT BE GIVEN CONTRAST DUE TO RENAL INSUFF.  STEPHANIE DAVIS,RT-RCT., Onset Date: [id number]   . Wellbutrin [Bupropion Hcl]     No current facility-administered medications on file prior to encounter.    Current Outpatient Prescriptions on File Prior to Encounter  Medication Sig  . gabapentin (NEURONTIN) 300 MG capsule Take 300 mg by mouth 2 (two) times daily.   Marland Kitchen. guaifenesin (HUMIBID E) 400 MG TABS Take 400 mg by mouth 2 (two) times daily as needed (congestion). For congestion.   Marland Kitchen. olmesartan (BENICAR) 20 MG tablet Take 20 mg by mouth daily.    FAMILY HISTORY:  His indicated that the status of his mother is unknown.    SOCIAL HISTORY: He  reports that he has been smoking Cigarettes.  He has a 110.00 pack-year smoking history. He has never used smokeless tobacco. He reports that he does not drink alcohol or use drugs.  REVIEW OF SYSTEMS:   Unable to obtain  VITAL SIGNS: BP 110/67 (BP Location: Left Arm)   Pulse 110   Temp (!) 96.8 F (36 C) (Axillary)   Resp 24   SpO2 94%   HEMODYNAMICS:    VENTILATOR SETTINGS:    INTAKE / OUTPUT: No intake/output data recorded.  PHYSICAL EXAMINATION: General:   elderly, bilateral amputee, on BiPAP full face mask in moderate distress  Neuro:  Awake, follows commands, grossly nonfocal HEENT:  No JVD, dry mucosa CardiovasculS1-S2 normal :  Prolonged expiration, no rhonchi or crackles Abdomen:  Soft nontender loskeletal: Bilateral amputee  Skin no rash  LABS:  BMET  Recent Labs Lab 11/30/2015 1026  NA 141  K 4.7  CL 102  CO2 24  BUN 86*  CREATININE 5.13*  GLUCOSE 183*    Electrolytes  Recent Labs Lab 11/06/2015 1026  CALCIUM 8.3*    CBC  Recent Labs Lab 11/08/2015 1026  WBC 8.3  HGB 14.1  HCT 43.9  PLT 152    Coag's No results for input(s): APTT, INR in the last 168  hours.  Sepsis Markers  Recent Labs Lab 11/22/2015 1111  LATICACIDVEN 2.01*    ABG No results for input(s): PHART, PCO2ART, PO2ART in the last 168 hours.  Liver Enzymes  Recent Labs Lab 11/12/2015 1026  AST 3,426*  ALT 2,108*  ALKPHOS 110  BILITOT 1.1  ALBUMIN 3.6    Cardiac Enzymes No results for input(s): TROPONINI, PROBNP in the last 168 hours.  Glucose No results for input(s): GLUCAP in the last 168 hours.  Imaging Dg Chest Portable 1 View  Result Date: 11/07/2015 CLINICAL DATA:  Productive cough x 2-3 days, worsening of SOB last night; hx CHF, COPD, HTN, dementia, smoker; pt was very unstable, confused, tech was not able to position pt for better AP view. EXAM: PORTABLE CHEST 1 VIEW COMPARISON:  06/23/2011 FINDINGS: Cardiac silhouette is normal in size. There stable changes from previous CABG surgery. No mediastinal or hilar masses. There is hazy opacity in the left lung base. This a reflect atelectasis or pneumonia. Lungs otherwise clear. No convincing pleural effusion. No gross pneumothorax. The bony thorax is demineralized. There are old healed rib fractures on the left. IMPRESSION: 1. Hazy left lung base opacity which may reflect pneumonia or atelectasis. No other evidence of acute cardiopulmonary disease. Electronically Signed   By: Amie Portland M.D.   On: 11/27/2015 10:46     STUDIES:    CULTURES: bld 9/16 >>  ANTIBIOTICS: Zosyn 9/16 > vanc 9/16 >>  SIGNIFICANT EVENTS:   LINES/TUBES:   DISCUSSION: Unclear cause of acute respiratory acidosis and liver transaminitis and this 75 year old smoker and alcoholic with COPD. Portable chest x-ray is difficult to interpret we'll treat for left-sided presumed HCAP  ASSESSMENT / PLAN:  PULMONARY A: Acute hypercarbic respiratory failure COPD P:   BiPAP 14/6-may need mechanical ventilation Repeat ABG in 3 hours  CARDIOVASCULAR A:  CAD Elevated troponin P:  Aspirin rectal 1 Follow serial  troponins  RENAL A:   AKI - ? Etio, on ARB , likely ATN P:   Hydrate chk FENa  GASTROINTESTINAL A:   Transaminitis ? Etio,   ETOH, statin Follow LFTs Chk lipase  HEMATOLOGIC A:   Mild thrombocytopenia P:  follow  INFECTIOUS A:   Severe sepsis, source - HCAP vs other occult P:   Rpt lactate Empiric zosyn/ vanc Chk UA Await cx data  ENDOCRINE A:   DM-2   P:   phalopathy  NEUROLOGIC A:   Acute metabolic encephalopathy Dementia MRI 2013- Old left frontal cortical and subcortical infarction P:   Serial neuro exam -intubate if he loses Mental status Resume Aricept when able to take by mouth   FAMILY  - Updates: Daughter [name]-this is a difficult social situation. Apparently son [name] and patient are both alcoholics, daughter has'divorced' them for the last 3 months. Son unable to take care of the patient and put him in a nursing home-she does request full CODE STATUS at this point  - Inter-disciplinary family meet or Palliative Care meeting due by: 9/23   Mycc  time x 60 mins   Cyril Mourning MD. FCCP. [facility] Pager [phone] If no response call [phone]     11-27-2015, 12:35 PM

## 2015-11-21 NOTE — ED Notes (Signed)
Respiratory at bedside.

## 2015-11-21 NOTE — Progress Notes (Signed)
Pharmacy Antibiotic Follow-up Note  [name] is a 75 y.o. year-old male admitted on 11/27/2015.  The patient is currently on day 1 of Vancomycin & Cefepime  for HCAP.  Assessment/Plan: Vancomycin 1 gm IV every 48 hours.  Goal trough 15-20 mcg/mL.  Cefepime 2gm x1, followed by 1 gm q24hr Monitor renal function closely  Temp (24hrs), Avg:96.8 F (36 C), Min:96.8 F (36 C), Max:96.8 F (36 C)   Recent Labs Lab 11/30/2015 1026  WBC 8.3   No results for input(s): CREATININE in the last 168 hours. CrCl cannot be calculated (Unknown ideal weight.).    Allergies  Allergen Reactions  . Ibuprofen Other (See Comments)    Can't take because of KIDNEY function  . Iohexol      Desc: PT IS NOT ALLERGIC TO CONTRAST- PT HAS BEEN TOLD BY DR FOX TO NOT BE GIVEN CONTRAST DUE TO RENAL INSUFF.  STEPHANIE DAVIS,RT-RCT., Onset Date: [id number]   . Wellbutrin [Bupropion Hcl]    Antimicrobials this admission:  9/16 Vancomycin >> 9/16 Cefepime >>   Levels/dose changes this admission:  Microbiology results: 9/16 BCx: sent 9/16 Legionella: pending 9/16 Strep pneumo Ag: pending  Thank you for allowing pharmacy to be a part of this patient's care.  Otho BellowsGreen, [name] PharmD 11/14/2015 11:11 AM

## 2015-11-21 NOTE — ED Provider Notes (Signed)
Emergency Department Provider Note   I have reviewed the triage vital signs and the nursing notes.   HISTORY  Chief Complaint Shortness of Breath and Cough   HPI [name] is a 75 y.o. male with PMH of CHF, COPD, CKD, CVA, dementia at SNF, HTN, HLD, CAD, and a-fib who presents to the emergency department by EMS for difficulty breathing. He has a known history of COPD and CHF. EMS reports that they were called to the patient's skilled nursing facility for difficulty breathing that started this morning. The patient has baseline dementia and confusion and is unable to provide significant historical detail. EMS reports that they gave albuterol in route with little improvement. Nursing staff report a productive cough that has been worsening recently. No known history of fever, shaking chills. Patient is unable to tell me if he is experiencing chest pain or abdominal pain.    Past Medical History:  Diagnosis Date  . CHF (congestive heart failure) (HCC)    S/p CABG   . Chronic kidney disease    Baseline Cr appears to be 1.2-1.4   . COPD (chronic obstructive pulmonary disease) (HCC)   . Coronary artery disease   . CVA (cerebral infarction)    CT head 06/2011 with remote left frontal infarct, remote basal ganglia  lacunar infarcts bilaterally  . Dementia   . Diabetes mellitus   . Hyperlipidemia   . Hypertension   . Myocardial infarction (HCC)   . Occlusion and stenosis of carotid artery without mention of cerebral infarction   . Paroxysmal a-fib (HCC)   . PVD (peripheral vascular disease) (HCC)    S/p bilateral BKA's   . Tobacco abuse    States 2 PPD currently     Patient Active Problem List   Diagnosis Date Noted  . Acute respiratory failure (HCC) 2015/12/11  . Weakness 06/23/2011  . Confusion 06/23/2011  . Hypotension 06/23/2011  . Hypernatremia 06/23/2011  . Hyperchloremia 06/23/2011  . Prerenal azotemia 06/23/2011  . Macrocytosis without anemia 06/23/2011  .  Tobacco abuse 06/23/2011  . 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 PVD 09/18/2007  . CELLULITIS AND ABSCESS OF UNSPECIFIED SITE 08/14/2007  . DM W/COMPLICATION NOS, TYPE II 10/04/2006  . GERD 10/04/2006  . RENAL INSUFFICIENCY, CHRONIC 10/04/2006  . INFECTION NOS, BONE, UNSPECIFIED SITE 10/04/2006  . STATUS, ARTHRODESIS 10/04/2006  . PSTPRC STATUS, AORTOCORONARY BYPASS 10/04/2006    Past Surgical History:  Procedure Laterality Date  . Coronary artery bypass x3  01/04/2002  . Lt below-the-knee amputation  12/07/2009  . RT below-the-knee amputation  07/31/2008    Current Outpatient Rx  . Order #: [id number] Class: Historical Med  . Order #: [id number] Class: Historical Med  . Order #: [id number] Class: Historical Med  . Order #: [id number] Class: Historical Med  . Order #: [id number] Class: Historical Med  . Order #: [id number] Class: Historical Med  . Order #: [id number] Class: Historical Med  . Order #: [id number] Class: Historical Med  . Order #: [id number] Class: Historical Med  . Order #: [id number] Class: Historical Med  . Order #: [id number] Class: Historical Med  . Order #: [id number] Class: Historical Med  . Order #: [id number] Class: Historical Med  . Order #: [id number] Class: Historical Med  . Order #: [id number] Class: Historical Med  . Order #: [id number] Class: Historical Med  . Order #: [id number] Class: Historical Med  . Order #: [id number] Class: Historical Med  . Order #: [id number] Class: Historical Med    Allergies Ibuprofen; Iohexol; and Wellbutrin [bupropion hcl]  Family History  Problem Relation Age  of Onset  . Stroke Mother     Social History Social History  Substance Use Topics  . Smoking status: Current Every Day Smoker    Packs/day: 2.00    Years: 55.00    Types: Cigarettes  . Smokeless tobacco: Never Used  . Alcohol use No    Review of Systems  Patient with baseline dementia is unable to provide significant  ROS  ____________________________________________   PHYSICAL EXAM:  VITAL SIGNS: ED Triage Vitals  Enc Vitals Group     BP 2015/10/02 1008 115/74     Pulse Rate 2015/10/02 1008 105     Resp 2015/10/02 1008 (!) 28     Temp 2015/10/02 1008 (!) 96.8 F (36 C)     Temp Source 2015/10/02 1008 Axillary     SpO2 2015/10/02 1006 98 %     Pain Score 2015/10/02 1010 0   Constitutional: Alert but in acute respiratory distress. Appears confused.  Eyes: Conjunctivae are normal.  Head: Atraumatic. Nose: No congestion/rhinnorhea. Mouth/Throat: Mucous membranes are moist.  Oropharynx non-erythematous. Neck: No stridor.   Cardiovascular: Sinus tachycardia. Good peripheral circulation. Grossly normal heart sounds.   Respiratory: Increased respiratory effort and tachypnea. Inspiratory and expiratory wheezing throughout all lung fields. Symmetrical exam.  Gastrointestinal: Soft and nontender. No distention.  Musculoskeletal: No lower extremity tenderness nor edema. No gross deformities of extremities. Neurologic: Confused. No gross focal neurologic deficits are appreciated.  Skin:  Skin is warm, dry and intact. No rash noted.   ____________________________________________   LABS (all labs ordered are listed, but only abnormal results are displayed)  Labs Reviewed  COMPREHENSIVE METABOLIC PANEL - Abnormal; Notable for the following:       Result Value   Glucose, Bld 183 (*)    BUN 86 (*)    Creatinine, Ser 5.13 (*)    Calcium 8.3 (*)    AST 3,426 (*)    ALT 2,108 (*)    GFR calc non Af Amer 10 (*)    GFR calc Af Amer 11 (*)    All other components within normal limits  CBC WITH DIFFERENTIAL/PLATELET - Abnormal; Notable for the following:    MCV 100.9 (*)    All other components within normal limits  BLOOD GAS, VENOUS - Abnormal; Notable for the following:    pH, Ven 7.128 (*)    pCO2, Ven 80.3 (*)    Acid-base deficit 5.7 (*)    All other components within normal limits  I-STAT TROPOININ, ED -  Abnormal; Notable for the following:    Troponin i, poc 1.24 (*)    All other components within normal limits  I-STAT CG4 LACTIC ACID, ED - Abnormal; Notable for the following:    Lactic Acid, Venous 2.01 (*)    All other components within normal limits  URINE CULTURE  CULTURE, BLOOD (ROUTINE X 2)  CULTURE, BLOOD (ROUTINE X 2)  LIPASE, BLOOD  URINALYSIS, ROUTINE W REFLEX MICROSCOPIC (NOT AT [facility])  CBC  CREATININE, SERUM  BLOOD GAS, ARTERIAL  STREP PNEUMONIAE URINARY ANTIGEN  LEGIONELLA PNEUMOPHILA SEROGP 1 UR AG  BASIC METABOLIC PANEL  TROPONIN I  TROPONIN I  TROPONIN I  PROCALCITONIN  LACTIC ACID, PLASMA  SODIUM, URINE, RANDOM  CREATININE, URINE, RANDOM   ____________________________________________  EKG   EKG Interpretation  Date/Time:  Saturday November 21 2015 10:13:03 EDT Ventricular Rate:  97 PR Interval:    QRS Duration: 104 QT Interval:  353 QTC Calculation: 449 R Axis:   -50 Text Interpretation:  Sinus rhythm LAD, consider left anterior fascicular block Nonspecific T abnrm, anterolateral leads No STEMI. Compared to prior tracings.  Confirmed by LONG MD, JOSHUA [phone]) on 12/02/2015 10:28:06 AM       ____________________________________________  RADIOLOGY  Dg Chest Portable 1 View  Result Date: 11/12/2015 CLINICAL DATA:  Productive cough x 2-3 days, worsening of SOB last night; hx CHF, COPD, HTN, dementia, smoker; pt was very unstable, confused, tech was not able to position pt for better AP view. EXAM: PORTABLE CHEST 1 VIEW COMPARISON:  06/23/2011 FINDINGS: Cardiac silhouette is normal in size. There stable changes from previous CABG surgery. No mediastinal or hilar masses. There is hazy opacity in the left lung base. This a reflect atelectasis or pneumonia. Lungs otherwise clear. No convincing pleural effusion. No gross pneumothorax. The bony thorax is demineralized. There are old healed rib fractures on the left. IMPRESSION: 1. Hazy left lung base opacity  which may reflect pneumonia or atelectasis. No other evidence of acute cardiopulmonary disease. Electronically Signed   By: Amie Portland M.D.   On: 11/26/2015 10:46    ____________________________________________   PROCEDURES  Procedure(s) performed:   Procedures  CRITICAL CARE Performed by: Maia Plan Total critical care time: 70 minutes Critical care time was exclusive of separately billable procedures and treating other patients. Critical care was necessary to treat or prevent imminent or life-threatening deterioration. Critical care was time spent personally by me on the following activities: development of treatment plan with patient and/or surrogate as well as nursing, discussions with consultants, evaluation of patient's response to treatment, examination of patient, obtaining history from patient or surrogate, ordering and performing treatments and interventions, ordering and review of laboratory studies, ordering and review of radiographic studies, pulse oximetry and re-evaluation of patient's condition.  Alona Bene, MD Emergency Medicine  ____________________________________________   INITIAL IMPRESSION / ASSESSMENT AND PLAN / ED COURSE  Pertinent labs & imaging results that were available during my care of the patient were reviewed by me and considered in my medical decision making (see chart for details).  Patient resents to the emergency department for evaluation of respiratory distress. He has history of AMI, CHF, and COPD. the patient has baseline dementia and is unable to provide significant HPI or ROS to assist with narrowing of his diagnosis. He does have inspiratory and expiratory wheezing with no appreciable rales or clinical signs of volume overload. Suspect COPD exacerbation and possibly in the setting of underlying infectious process such as pneumonia. I started the patient on continuous albuterol and will give IV Solu-Medrol. Portable chest x-ray pending.  EKG reviewed with notable sinus tachycardia but no evidence of acute ischemia.   11:03 AM VBG concerning for respiratory acidosis. Will initiate BiPAP. Patient with some signs of pneumonia on portable chest x-ray. Will initiate code sepsis given his tachycardia, tachypnea, and low temp. Used order set for HCAP coverage. Patient does have congestive heart failure with no hypotension here in the emergency department. Will not give 30 ml/kg of fluid in this setting but will monitor closely. Discussed plan with nursing and RT. Patient with no acute change in mental status or WOB at this time.   11:56 AM Spoke with the patinet's sister by phone. She is en route to the ED. she reports as far she knows the patient would want to be intubated and have life-saving measures performed if necessary. The patient seems to be tolerating BiPAP well. He has acute on chronic renal failure with elevated troponin and lactate. I  suspect that this is secondary to sepsis and does not represent a primary cardiac event. I plan to discuss the case with critical care to decide on this position to the ICU or stepdown. Plan to also consult nephrology but do not feel the patient needs acute dialysis.   12:07 PM Spoke with ICU physician who will be down for evaluation.   Patient admitted to ICU. Tolerating BiPAP well.  ____________________________________________  FINAL CLINICAL IMPRESSION(S) / ED DIAGNOSES  Final diagnoses:  Healthcare-associated pneumonia  Dyspnea  Sepsis, due to unspecified organism [facility])  Renal failure     MEDICATIONS GIVEN DURING THIS VISIT:  Medications  albuterol (PROVENTIL) (2.5 MG/3ML) 0.083% nebulizer solution 5 mg (5 mg Nebulization Not Given 2015/12/05 1033)  0.9 %  sodium chloride infusion (not administered)  heparin injection 5,000 Units (5,000 Units Subcutaneous Given 2015/12/05 1355)  0.9 %  sodium chloride infusion ( Intravenous New Bag/Given 12-05-2015 1258)  ipratropium-albuterol (DUONEB)  0.5-2.5 (3) MG/3ML nebulizer solution 3 mL (3 mLs Nebulization Given 2015/12/05 1343)  acetaminophen (TYLENOL) tablet 650 mg (not administered)  famotidine (PEPCID) IVPB 20 mg premix (0 mg Intravenous Stopped 12-05-2015 1328)  methylPREDNISolone sodium succinate (SOLU-MEDROL) 40 mg/mL injection 40 mg (40 mg Intravenous Not Given 12-05-15 1253)  albuterol (PROVENTIL,VENTOLIN) solution continuous neb (10 mg/hr Nebulization Given 12/05/15 1033)  methylPREDNISolone sodium succinate (SOLU-MEDROL) 125 mg/2 mL injection 125 mg (125 mg Intravenous Given 12-05-2015 1036)  ceFEPIme (MAXIPIME) 2 g in dextrose 5 % 50 mL IVPB (0 g Intravenous Stopped 12-05-2015 1152)  vancomycin (VANCOCIN) IVPB 1000 mg/200 mL premix (0 mg Intravenous Stopped 12/05/15 1257)  sodium chloride 0.9 % bolus 500 mL (0 mLs Intravenous Stopped 2015/12/05 1152)  aspirin suppository 300 mg (300 mg Rectal Given 12/05/15 1258)  sodium chloride 0.9 % bolus 500 mL (0 mLs Intravenous Stopped 12-05-15 1405)     NEW OUTPATIENT MEDICATIONS STARTED DURING THIS VISIT:  None   Note:  This document was prepared using Dragon voice recognition software and may include unintentional dictation errors.  Alona Bene, MD Emergency Medicine   Maia Plan, MD 12/05/2015 [phone]

## 2015-11-21 NOTE — ED Notes (Signed)
Bed: WA10 Expected date: 2016-03-06 Expected time: 10:02 AM Means of arrival: Ambulance Comments: Shob, tx in process

## 2015-11-21 NOTE — ED Triage Notes (Signed)
[facility] of [facility] last night with productive cough over past few days. Pt given 5 mg Albuterol x2 and 0.5 mg Atrovent en route with EMS related to I/E wheezing throughout all fields. Pt hx of dementia oriented per normal.

## 2015-11-21 NOTE — Consult Note (Signed)
Reason for Consult: Acute renal failure on chronic kidney disease stage III Referring Physician: Alona BeneJoshua Long M.D. (ED physician)  HPI:  History could not be obtained from the patient due to his respiratory distress on BiPAP and was pieced together from previously obtain records/his daughter Eunice BlaseDebbie.  75 year old Caucasian man with past medical history significant for COPD (ongoing smoker), CAD status post CABG with ischemic cardiomyopathy and CHF, ongoing alcohol use, PAD status post bilateral BKA's, dementia and what appears to be baseline chronic kidney disease with a creatinine of around 1.4. Protein to the emergency room from his skilled nursing facility with increasing shortness of breath and productive cough for the past 7 days. Unknown whether he also had any nausea, vomiting or diarrhea/changes to his oral intake. He was taking Benicar 20 mg daily prior to admission. His daughter reports that he saw Dr. Caryn SectionFox (nephrologist) several years ago when he was told that he had stage II kidney disease.  Past Medical History:  Diagnosis Date  . CHF (congestive heart failure) (HCC)    S/p CABG   . Chronic kidney disease    Baseline Cr appears to be 1.2-1.4   . COPD (chronic obstructive pulmonary disease) (HCC)   . Coronary artery disease   . CVA (cerebral infarction)    CT head 06/2011 with remote left frontal infarct, remote basal ganglia  lacunar infarcts bilaterally  . Dementia   . Diabetes mellitus   . Hyperlipidemia   . Hypertension   . Myocardial infarction (HCC)   . Occlusion and stenosis of carotid artery without mention of cerebral infarction   . Paroxysmal a-fib (HCC)   . PVD (peripheral vascular disease) (HCC)    S/p bilateral BKA's   . Tobacco abuse    States 2 PPD currently     Past Surgical History:  Procedure Laterality Date  . Coronary artery bypass x3  01/04/2002  . Lt below-the-knee amputation  12/07/2009  . RT below-the-knee amputation  07/31/2008    Family History   Problem Relation Age of Onset  . Stroke Mother     Social History:  reports that he has been smoking Cigarettes.  He has a 110.00 pack-year smoking history. He has never used smokeless tobacco. He reports that he does not drink alcohol or use drugs.  Allergies:  Allergies  Allergen Reactions  . Ibuprofen Other (See Comments)    Can't take because of KIDNEY function  . Iohexol      Desc: PT IS NOT ALLERGIC TO CONTRAST- PT HAS BEEN TOLD BY DR FOX TO NOT BE GIVEN CONTRAST DUE TO RENAL INSUFF.  STEPHANIE DAVIS,RT-RCT., Onset Date: [id number]   . Wellbutrin [Bupropion Hcl]     Medications:  Scheduled: . albuterol  5 mg Nebulization Once  . heparin  5,000 Units Subcutaneous Q8H  . ipratropium-albuterol  3 mL Nebulization Q6H  . methylPREDNISolone (SOLU-MEDROL) injection  40 mg Intravenous Q12H    BMP Latest Ref Rng & Units 04/28/2015 06/25/2011 06/24/2011  Glucose 65 - 99 mg/dL 811(B183(H) 84 83  BUN 6 - 20 mg/dL 14(N86(H) 17 19  Creatinine 0.61 - 1.24 mg/dL 8.29(F5.13(H) 6.211.22 3.081.15  Sodium 135 - 145 mmol/L 141 141 141  Potassium 3.5 - 5.1 mmol/L 4.7 3.8 3.6  Chloride 101 - 111 mmol/L 102 113(H) 113(H)  CO2 22 - 32 mmol/L 24 20 22   Calcium 8.9 - 10.3 mg/dL 8.3(L) 8.8 8.5   CBC Latest Ref Rng & Units 04/28/2015 06/25/2011 06/24/2011  WBC 4.0 - 10.5 K/uL  8.3 5.7 5.3  Hemoglobin 13.0 - 17.0 g/dL 16.1 09.6 12.4(L)  Hematocrit 39.0 - 52.0 % 43.9 38.9(L) 38.5(L)  Platelets 150 - 400 K/uL 152 146(L) 143(L)     Dg Chest Portable 1 View  Result Date: 11/20/2015 CLINICAL DATA:  Productive cough x 2-3 days, worsening of SOB last night; hx CHF, COPD, HTN, dementia, smoker; pt was very unstable, confused, tech was not able to position pt for better AP view. EXAM: PORTABLE CHEST 1 VIEW COMPARISON:  06/23/2011 FINDINGS: Cardiac silhouette is normal in size. There stable changes from previous CABG surgery. No mediastinal or hilar masses. There is hazy opacity in the left lung base. This a reflect atelectasis or  pneumonia. Lungs otherwise clear. No convincing pleural effusion. No gross pneumothorax. The bony thorax is demineralized. There are old healed rib fractures on the left. IMPRESSION: 1. Hazy left lung base opacity which may reflect pneumonia or atelectasis. No other evidence of acute cardiopulmonary disease. Electronically Signed   By: Amie Portland M.D.   On: 11/15/2015 10:46    Review of Systems  Unable to perform ROS: Other (Patient on BiPAP and somnolent)   Blood pressure 106/55, pulse 86, temperature 98.6 F (37 C), temperature source Rectal, resp. rate 19, SpO2 100 %. Physical Exam  Nursing note and vitals reviewed. Constitutional: He appears well-developed and well-nourished.  Unkempt  HENT:  Head: Normocephalic.  On BiPAP  Eyes: Pupils are equal, round, and reactive to light.  Neck: No JVD present. No thyromegaly present.  On BiPAP  Cardiovascular: Normal rate, regular rhythm and normal heart sounds.   No murmur heard. Respiratory: He has wheezes. He has no rales.  Coarse breath sounds bilaterally with intermittent expiratory rhonchi  GI: Soft. Bowel sounds are normal. He exhibits no distension. There is no tenderness.  Musculoskeletal:  Status post bilateral below knee amputation-no edema over thighs  Neurological:  Somnolent and awakens to calling out his name  Skin: Skin is warm and dry. No rash noted.    Assessment/Plan: 1. Acute renal failure on chronic kidney disease stage III: Appears to have been hemodynamically mediated with possible pneumonia/relative hypotension and likely intravascular volume contraction/prerenal azotemia with ongoing ARB therapy with evolution to ATN. Unfortunately, minimal urine output in the ER noted so far and bladder scan revealed only about 20 mL. Would recommend Foley catheter instead of condom catheter upon arrival to the floor for better charting urine output. Agree with gentle intravenous fluid resuscitation so as not to exacerbate  congestive heart failure. No acute electrolyte abnormality or uremic symptoms to prompt hemodialysis at this time. Check renal ultrasound and urine electrolytes to further understand etiology/management of his renal failure. Continue to avoid nephrotoxins including dedicated intravenous contrast. Agree with holding ARB and gabapentin at this time. 2. Hypercarbic respiratory failure with respiratory acidosis: With history of COPD and ongoing tobacco use, and likely has long-standing hypercapnia however appears to be clearly worse. Ongoing trial of BiPAP and may need intubation if unable to improve blood gas with noninvasive ventilation. On treatment for pneumonia. 3. Elevated transaminases: History provided of significant and ongoing alcohol and statin use. Will check viral hepatitis panel as well as urine Legionella antigen. 4. Elevated troponin: Ongoing evaluation for ACS with serial troponins and status post rectal aspirin.  Clotile Whittington K. 12/05/2015, 2:08 PM

## 2015-11-21 NOTE — ED Notes (Signed)
[facility] to give 0.9% NaCl 500 ml bolus.

## 2015-11-21 NOTE — ED Notes (Signed)
Radiology at bedside

## 2015-11-21 NOTE — ED Notes (Signed)
Daughter at bedside.

## 2015-11-21 NOTE — ED Notes (Signed)
Per Vassie LollAlva MD bladder scan. Alva notified of bladder scan result. Verbal to attempt clean catch; if no success, in and out.

## 2015-11-21 NOTE — Procedures (Signed)
Intubation Procedure Note Bryan BunnellDonald G [name] [id number] 06/18/1940  Procedure: Intubation Indications: Respiratory insufficiency  Procedure Details Consent: Unable to obtain consent because of emergent medical necessity. Time Out: Verified patient identification, verified procedure, site/side was marked, verified correct patient position, special equipment/implants available, medications/allergies/relevent history reviewed, required imaging and test results available.  Performed  Maximum sterile technique was used including antiseptics, cap, gloves, hand hygiene and mask.  MAC and 4    Evaluation Hemodynamic Status: Transient hypotension treated with fluid; O2 sats: stable throughout Patient's Current Condition: stable Complications: No apparent complications Patient did tolerate procedure well. Chest X-ray ordered to verify placement.  CXR: pending.   Nelda BucksFEINSTEIN,Abbigail Anstey J. 01-03-2016  Arrived on stretcher agonal  Mcarthur RossettiDaniel J. Tyson AliasFeinstein, MD, FACP Pgr: [phone] [facility]

## 2015-11-22 ENCOUNTER — Inpatient Hospital Stay (HOSPITAL_COMMUNITY): Payer: Medicare Other

## 2015-11-22 DIAGNOSIS — N189 Chronic kidney disease, unspecified: Secondary | ICD-10-CM

## 2015-11-22 DIAGNOSIS — Z978 Presence of other specified devices: Secondary | ICD-10-CM

## 2015-11-22 DIAGNOSIS — N179 Acute kidney failure, unspecified: Secondary | ICD-10-CM

## 2015-11-22 DIAGNOSIS — A419 Sepsis, unspecified organism: Principal | ICD-10-CM

## 2015-11-22 DIAGNOSIS — Z789 Other specified health status: Secondary | ICD-10-CM

## 2015-11-22 LAB — BASIC METABOLIC PANEL
ANION GAP: 8 (ref 5–15)
Anion gap: 8 (ref 5–15)
BUN: 78 mg/dL — AB (ref 6–20)
BUN: 79 mg/dL — ABNORMAL HIGH (ref 6–20)
CALCIUM: 7.7 mg/dL — AB (ref 8.9–10.3)
CHLORIDE: 113 mmol/L — AB (ref 101–111)
CO2: 22 mmol/L (ref 22–32)
CO2: 21 mmol/L — AB (ref 22–32)
Calcium: 7.6 mg/dL — ABNORMAL LOW (ref 8.9–10.3)
Chloride: 111 mmol/L (ref 101–111)
Creatinine, Ser: 3.2 mg/dL — ABNORMAL HIGH (ref 0.61–1.24)
Creatinine, Ser: 2.87 mg/dL — ABNORMAL HIGH (ref 0.61–1.24)
GFR calc Af Amer: 20 mL/min — ABNORMAL LOW (ref >60)
GFR calc Af Amer: 23 mL/min — ABNORMAL LOW (ref >60)
GFR calc non Af Amer: 17 mL/min — ABNORMAL LOW (ref >60)
GFR calc non Af Amer: 20 mL/min — ABNORMAL LOW (ref >60)
GLUCOSE: 202 mg/dL — AB (ref 65–99)
Glucose, Bld: 240 mg/dL — ABNORMAL HIGH (ref 65–99)
POTASSIUM: 3.5 mmol/L (ref 3.5–5.1)
POTASSIUM: 3.5 mmol/L (ref 3.5–5.1)
Sodium: 143 mmol/L (ref 135–145)
Sodium: 140 mmol/L (ref 135–145)

## 2015-11-22 LAB — BLOOD GAS, ARTERIAL
Acid-base deficit: 3.3 mmol/L — ABNORMAL HIGH (ref 0.0–2.0)
BICARBONATE: 21 mmol/L (ref 20.0–28.0)
Drawn by: 441261
FIO2: 0.4
O2 SAT: 92.5 %
PEEP: 8 cmH2O
PH ART: 7.378 (ref 7.350–7.450)
Patient temperature: 98.6
RATE: 26 resp/min
VT: 500 mL
pCO2 arterial: 36.5 mmHg (ref 32.0–48.0)
pO2, Arterial: 73.2 mmHg — ABNORMAL LOW (ref 83.0–108.0)

## 2015-11-22 LAB — PHOSPHORUS
Phosphorus: 4.4 mg/dL (ref 2.5–4.6)
Phosphorus: 3.9 mg/dL (ref 2.5–4.6)

## 2015-11-22 LAB — MAGNESIUM
MAGNESIUM: 2 mg/dL (ref 1.7–2.4)
Magnesium: 2.1 mg/dL (ref 1.7–2.4)
Magnesium: 2 mg/dL (ref 1.7–2.4)

## 2015-11-22 LAB — RENAL FUNCTION PANEL
ALBUMIN: 2.6 g/dL — AB (ref 3.5–5.0)
Anion gap: 11 (ref 5–15)
BUN: 82 mg/dL — AB (ref 6–20)
CALCIUM: 7.4 mg/dL — AB (ref 8.9–10.3)
CO2: 21 mmol/L — ABNORMAL LOW (ref 22–32)
CREATININE: 3.99 mg/dL — AB (ref 0.61–1.24)
Chloride: 110 mmol/L (ref 101–111)
GFR calc Af Amer: 15 mL/min — ABNORMAL LOW (ref >60)
GFR calc non Af Amer: 13 mL/min — ABNORMAL LOW (ref >60)
GLUCOSE: 318 mg/dL — AB (ref 65–99)
PHOSPHORUS: 5.3 mg/dL — AB (ref 2.5–4.6)
Potassium: 3.9 mmol/L (ref 3.5–5.1)
SODIUM: 142 mmol/L (ref 135–145)

## 2015-11-22 LAB — HEPATIC FUNCTION PANEL
ALBUMIN: 2.4 g/dL — AB (ref 3.5–5.0)
ALK PHOS: 84 U/L (ref 38–126)
ALT: 1191 U/L — AB (ref 17–63)
AST: 590 U/L — ABNORMAL HIGH (ref 15–41)
BILIRUBIN INDIRECT: 0.4 mg/dL (ref 0.3–0.9)
Bilirubin, Direct: 0.4 mg/dL (ref 0.1–0.5)
TOTAL PROTEIN: 5.5 g/dL — AB (ref 6.5–8.1)
Total Bilirubin: 0.8 mg/dL (ref 0.3–1.2)

## 2015-11-22 LAB — CBC
HCT: 38.1 % — ABNORMAL LOW (ref 39.0–52.0)
HEMOGLOBIN: 12.2 g/dL — AB (ref 13.0–17.0)
MCH: 31.4 pg (ref 26.0–34.0)
MCHC: 32 g/dL (ref 30.0–36.0)
MCV: 98.2 fL (ref 78.0–100.0)
PLATELETS: 122 10*3/uL — AB (ref 150–400)
RBC: 3.88 MIL/uL — AB (ref 4.22–5.81)
RDW: 14.1 % (ref 11.5–15.5)
WBC: 6.9 10*3/uL (ref 4.0–10.5)

## 2015-11-22 LAB — HEMOGLOBIN A1C
HEMOGLOBIN A1C: 6.3 % — AB (ref 4.8–5.6)
Mean Plasma Glucose: 134 mg/dL

## 2015-11-22 LAB — GLUCOSE, CAPILLARY
GLUCOSE-CAPILLARY: 189 mg/dL — AB (ref 65–99)
Glucose-Capillary: 272 mg/dL — ABNORMAL HIGH (ref 65–99)
Glucose-Capillary: 223 mg/dL — ABNORMAL HIGH (ref 65–99)
Glucose-Capillary: 196 mg/dL — ABNORMAL HIGH (ref 65–99)
Glucose-Capillary: 214 mg/dL — ABNORMAL HIGH (ref 65–99)
Glucose-Capillary: 213 mg/dL — ABNORMAL HIGH (ref 65–99)

## 2015-11-22 LAB — CORTISOL: Cortisol, Plasma: 11 ug/dL

## 2015-11-22 LAB — TROPONIN I: TROPONIN I: 0.59 ng/mL — AB (ref <0.03)

## 2015-11-22 MED ORDER — FENTANYL BOLUS VIA INFUSION
25.0000 ug | INTRAVENOUS | Status: DC | PRN
  Administered 2015-11-23: 25 ug via INTRAVENOUS
  Filled 2015-11-22: qty 25

## 2015-11-22 MED ORDER — INSULIN ASPART 100 UNIT/ML ~~LOC~~ SOLN
0.0000 [IU] | SUBCUTANEOUS | Status: DC
  Administered 2015-11-22: 2 [IU] via SUBCUTANEOUS
  Administered 2015-11-22: 3 [IU] via SUBCUTANEOUS
  Administered 2015-11-22: 2 [IU] via SUBCUTANEOUS
  Administered 2015-11-22: 5 [IU] via SUBCUTANEOUS
  Administered 2015-11-22 (×2): 3 [IU] via SUBCUTANEOUS
  Administered 2015-11-23: 2 [IU] via SUBCUTANEOUS
  Administered 2015-11-23: 3 [IU] via SUBCUTANEOUS
  Administered 2015-11-23: 1 [IU] via SUBCUTANEOUS
  Administered 2015-11-23: 2 [IU] via SUBCUTANEOUS
  Administered 2015-11-23: 3 [IU] via SUBCUTANEOUS
  Administered 2015-11-24: 2 [IU] via SUBCUTANEOUS
  Administered 2015-11-24 (×2): 1 [IU] via SUBCUTANEOUS
  Administered 2015-11-24: 2 [IU] via SUBCUTANEOUS
  Administered 2015-11-25 (×2): 1 [IU] via SUBCUTANEOUS
  Administered 2015-11-26: 3 [IU] via SUBCUTANEOUS
  Administered 2015-11-26: 2 [IU] via SUBCUTANEOUS
  Administered 2015-11-26: 5 [IU] via SUBCUTANEOUS
  Administered 2015-11-27 (×3): 2 [IU] via SUBCUTANEOUS
  Administered 2015-11-27: 1 [IU] via SUBCUTANEOUS
  Administered 2015-11-27 – 2015-11-28 (×2): 3 [IU] via SUBCUTANEOUS
  Administered 2015-11-28: 1 [IU] via SUBCUTANEOUS
  Administered 2015-11-28: 2 [IU] via SUBCUTANEOUS
  Administered 2015-11-28: 3 [IU] via SUBCUTANEOUS
  Administered 2015-11-28 – 2015-11-29 (×5): 1 [IU] via SUBCUTANEOUS
  Administered 2015-11-30: 2 [IU] via SUBCUTANEOUS
  Administered 2015-11-30: 1 [IU] via SUBCUTANEOUS
  Administered 2015-12-01: 3 [IU] via SUBCUTANEOUS
  Administered 2015-12-01: 2 [IU] via SUBCUTANEOUS
  Administered 2015-12-01: 1 [IU] via SUBCUTANEOUS
  Administered 2015-12-01 (×2): 3 [IU] via SUBCUTANEOUS

## 2015-11-22 MED ORDER — HEPARIN SODIUM (PORCINE) 5000 UNIT/ML IJ SOLN
5000.0000 [IU] | Freq: Three times a day (TID) | INTRAMUSCULAR | Status: DC
Start: 2015-11-22 — End: 2015-11-22

## 2015-11-22 MED ORDER — SODIUM CHLORIDE 0.45 % IV SOLN
INTRAVENOUS | Status: DC
  Administered 2015-11-22: 125 mL/h via INTRAVENOUS
  Administered 2015-11-23 – 2015-11-24 (×3): via INTRAVENOUS

## 2015-11-22 MED ORDER — FENTANYL CITRATE (PF) 2500 MCG/50ML IJ SOLN
25.0000 ug/h | INTRAMUSCULAR | Status: DC
  Administered 2015-11-22: 50 ug/h via INTRAVENOUS
  Filled 2015-11-22: qty 50

## 2015-11-22 MED ORDER — PRO-STAT SUGAR FREE PO LIQD
30.0000 mL | Freq: Two times a day (BID) | ORAL | Status: DC
  Administered 2015-11-22 – 2015-11-23 (×3): 30 mL
  Filled 2015-11-22 (×5): qty 30

## 2015-11-22 MED ORDER — FENTANYL CITRATE (PF) 100 MCG/2ML IJ SOLN: 50.0000 ug | Freq: Once | INTRAMUSCULAR | Status: DC

## 2015-11-22 MED ORDER — VITAL HIGH PROTEIN PO LIQD
1000.0000 mL | ORAL | Status: DC
  Administered 2015-11-22 – 2015-11-23 (×2): 1000 mL

## 2015-11-22 NOTE — Progress Notes (Signed)
eLink Physician-Brief Progress Note Patient Name: Bryan BunnellDonald G Quinton DOB: 07/22/1940 MRN: [id number]   Date of Service  11/22/2015  HPI/Events of Note  Bedside nurse reports serum glucose 318 on a.m. labs. Currently on Solu-Medrol. Reported history of diabetes mellitus on past medical history from admission H&P but no evidence of any home medications on current list.   eICU Interventions  1. Checking hemoglobin A1c 2. Accu-Cheks every 4 hours 3. Sliding-scale insulin per sensitive algorithm      Intervention Category Intermediate Interventions: Hyperglycemia - evaluation and treatment  Lawanda CousinsJennings Ariyan Sinnett 11/22/2015, 5:00 AM

## 2015-11-22 NOTE — Progress Notes (Signed)
Patient ID: [name], male   DOB: [birth year], 75 y.o.   MRN: [id number]  [facility] Progress Note   Assessment/ Plan:   1. Acute renal failure on chronic kidney disease stage III: Appears hemodynamically mediated with lower respiratory tract infection/sustained pre-azotemia probably evolving into ATN. Overnight, renal function improved with gentle intravenous fluids-we'll continue maintenance IV fluids to limit intravascular volume contraction and promote continued renal recovery. Good urine output and he does not have any acute electrolyte abnormalities to prompt intervention. Agree with holding ARB and gabapentin at this time. 2. Hypercarbic respiratory failure with respiratory acidosis: With history of COPD and ongoing tobacco use- Intubated overnight after failure of BiPAP. 3. Elevated transaminases: History provided of significant and ongoing alcohol and statin use. Hepatitis panel and Legionella studies pending 4. Elevated troponin: Probably from demand ischemia with respiratory failure/acute on chronic renal failure-further management per CCM.  Subjective:   Intubated yesterday after failure of BiPAP trial and transferred to [facility] from [place].    Objective:   BP 118/81 (BP Location: Right Arm)   Pulse 86   Temp 97.4 F (36.3 C) (Rectal)   Resp (!) 22   Wt 69.9 kg (154 lb)   SpO2 95%   BMI 20.32 kg/m   Intake/Output Summary (Last 24 hours) at 11/22/15 0944 Last data filed at 11/22/15 0800  Gross per 24 hour  Intake          2511.78 ml  Output             1000 ml  Net          1511.78 ml   Weight change:   Physical Exam: IAX:[id number], sedated comfortably CVS: Pulse regular in rhythm, S1 and S2 normal Resp: Clear to auscultation bilaterally, no rales Abd: Soft, flat, nontender Ext: Status post bilateral below-knee amputations  Imaging: US Renal  Result Date: 11/09/2015 CLINICAL DATA:  Renal failure, acute on chronic, hypertension,  diabetes mellitus, hyperlipidemia, coronary artery disease post MI, CHF, COPD, paroxysmal atrial fibrillation EXAM: RENAL / URINARY TRACT ULTRASOUND COMPLETE COMPARISON:  Abdomen ultrasound 03/28/2013 FINDINGS: Right Kidney: Length: 11.6 cm. Normal cortical thickness. Increased cortical echogenicity. Several tiny RIGHT renal cysts. No solid mass, hydronephrosis or shadowing calcification. Left Kidney: Length: 10.3 cm. Suboptimal visualization due to body habitus and limited positioning (patient on ventilator) poorly visualized. Normal cortical thickness. No gross evidence of mass or hydronephrosis. Bladder: Poorly distended, with suboptimal assessment of wall thickness. Prostate enlargement with gland measuring 4.8 x 3.9 x 3.1 cm. IMPRESSION: Prostatic enlargement. Tiny RIGHT renal cyst. Question medical renal disease changes. Suboptimal visualization of LEFT kidney as above. Electronically Signed   By: Lavonia Dana M.D.   On: 11/23/2015 16:26   Dg Chest Port 1 View  Result Date: 11/22/2015 CLINICAL DATA:  75 year old male with respiratory failure. EXAM: PORTABLE CHEST 1 VIEW COMPARISON:  12/05/2015 and prior radiograph FINDINGS: An endotracheal tube with tip 4.8 cm above the carina, NG tube entering the stomach with tip off the field of view and right IJ central venous catheter with tip overlying the mid -lower SVC again noted. Cardiomediastinal silhouette is unchanged. CABG changes again noted. Left lower lung airspace disease and small left pleural effusion again noted. There has been little interval change since the prior study. . IMPRESSION: No significant change. Continued left lower lung airspace disease and small left pleural effusion. Electronically Signed   By: Margarette Canada M.D.   On: 11/22/2015 09:02   Dg Chest [facility]  1 View  Result Date: 12/02/2015 CLINICAL DATA:  Intubated EXAM: PORTABLE CHEST 1 VIEW COMPARISON:  Chest radiograph from earlier today. FINDINGS: Endotracheal tube tip is 4.1 cm above  the carina. Right internal jugular central venous catheter terminates in the lower third of the superior vena cava. Sternotomy wires appear aligned and intact. Stable cardiomediastinal silhouette with normal heart size and atherosclerotic thoracic aorta. No pneumothorax. Stable small left pleural effusion and patchy left lung base opacity. No pulmonary edema. IMPRESSION: 1. Well-positioned support structures as described. No pneumothorax. 2. Stable patchy left lung base opacity, which could represent aspiration, pneumonia or atelectasis. 3. Probable stable small left pleural effusion. Electronically Signed   By: Ilona Sorrel M.D.   On: 11/29/2015 18:02   Dg Chest Portable 1 View  Result Date: 11/22/2015 CLINICAL DATA:  Productive cough x 2-3 days, worsening of SOB last night; hx CHF, COPD, HTN, dementia, smoker; pt was very unstable, confused, tech was not able to position pt for better AP view. EXAM: PORTABLE CHEST 1 VIEW COMPARISON:  06/23/2011 FINDINGS: Cardiac silhouette is normal in size. There stable changes from previous CABG surgery. No mediastinal or hilar masses. There is hazy opacity in the left lung base. This a reflect atelectasis or pneumonia. Lungs otherwise clear. No convincing pleural effusion. No gross pneumothorax. The bony thorax is demineralized. There are old healed rib fractures on the left. IMPRESSION: 1. Hazy left lung base opacity which may reflect pneumonia or atelectasis. No other evidence of acute cardiopulmonary disease. Electronically Signed   By: Lajean Manes M.D.   On: 11/12/2015 10:46    Labs: BMET  Recent Labs Lab 11/19/2015 1026 11/22/15 0345  NA 141 142  K 4.7 3.9  CL 102 110  CO2 24 21*  GLUCOSE 183* 318*  BUN 86* 82*  CREATININE 5.13* 3.99*  CALCIUM 8.3* 7.4*  PHOS  --  5.3*   CBC  Recent Labs Lab 11/20/2015 1026 11/22/15 0345  WBC 8.3 6.9  NEUTROABS 6.9  --   HGB 14.1 12.2*  HCT 43.9 38.1*  MCV 100.9* 98.2  PLT 152 122*    Medications:    .  albuterol  5 mg Nebulization Once  . ceFEPime (MAXIPIME) IV  1 g Intravenous Q24H  . chlorhexidine gluconate (MEDLINE KIT)  15 mL Mouth Rinse BID  . famotidine (PEPCID) IV  20 mg Intravenous Q12H  . feeding supplement (PRO-STAT SUGAR FREE 64)  30 mL Per Tube BID  . feeding supplement (VITAL HIGH PROTEIN)  1,000 mL Per Tube Q24H  . fentaNYL (SUBLIMAZE) injection  50 mcg Intravenous Once  . heparin  5,000 Units Subcutaneous Q8H  . insulin aspart  0-9 Units Subcutaneous Q4H  . ipratropium-albuterol  3 mL Nebulization Q6H  . mouth rinse  15 mL Mouth Rinse QID  . methylPREDNISolone (SOLU-MEDROL) injection  40 mg Intravenous Q12H  . pantoprazole sodium  40 mg Per Tube Daily  . sodium chloride  500 mL Intravenous Once  . sodium chloride  500 mL Intravenous Once  . [START ON 11/23/2015] vancomycin  1,000 mg Intravenous Q48H   Elmarie Shiley, MD 11/22/2015, 9:44 AM

## 2015-11-22 NOTE — H&P (Signed)
PULMONARY / CRITICAL CARE MEDICINE   Name: [name] MRN: [id number] DOB: 12-09-1940    ADMISSION DATE:  2015/11/27 CONSULTATION DATE:  11/22/2015  REFERRING MD:  ED, Long MD   CHIEF COMPLAINT:  Respiratory distress  HISTORY OF PRESENT ILLNESS:   75 year old smoker and alcoholic, nursing home resident since 08/2015 brought in by EMS to ED for respiratory distress. He has a known history of COPD and CHF. There is a report of dementia and his meds show Aricept. ED evaluation showed acute respiratory acidosis on VBG and he was placed on BiPAP, he was given empiric antibiotics for left-sided infiltrate on portable fil m which have reviewed. Labs showed evidence of acute renal failure and transaminitis, mild lactic acidosis and slight elevated troponin EKG showed nonspecific T-wave abnormalities in anterolateral leads  Unable to obtain full review of systems since he is is on a BiPAP. Nursing home reports productive cough for past few days. 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 He continues to smoke and drink heavily. I note that can of beer cribed to him daily at the nursing home  Subject: Urine output up Levo needed calm  VITAL SIGNS: BP 118/81 (BP Location: Right Arm)   Pulse 86   Temp 97.4 F (36.3 C) (Rectal)   Resp (!) 22   Wt 69.9 kg (154 lb)   SpO2 93%   BMI 20.32 kg/m   HEMODYNAMICS:    VENTILATOR SETTINGS: Vent Mode: PRVC FiO2 (%):  [40 %-100 %] 40 % Set Rate:  [26 bmp] 26 bmp Vt Set:  [500 mL] 500 mL PEEP:  [8 cmH20] 8 cmH20 Plateau Pressure:  [19 cmH20-21 cmH20] 21 cmH20  INTAKE / OUTPUT: I/O last 3 completed shifts: In: 2511.8 [I.V.:2461.8; IV Piggyback:50] Out: 700 [Urine:700]  PHYSICAL EXAMINATION: General:   elderly, bilateral amputee, on BiPAP full face mask in moderate distress  Neuro: rass -1, int FC HEENT:  No JVD, dry mucosa CardiovasculS1-S2 normal LUng:  ronchi rt Abdomen:  Soft nontender, no r/g loskeletal: Bilateral amputee   Skin no rash  LABS:  BMET  Recent Labs Lab 11/27/2015 1026 11/22/15 0345  NA 141 142  K 4.7 3.9  CL 102 110  CO2 24 21*  BUN 86* 82*  CREATININE 5.13* 3.99*  GLUCOSE 183* 318*    Electrolytes  Recent Labs Lab 11-27-15 1026 11/22/15 0345  CALCIUM 8.3* 7.4*  MG  --  2.0  PHOS  --  5.3*    CBC  Recent Labs Lab 11/27/15 1026 11/22/15 0345  WBC 8.3 6.9  HGB 14.1 12.2*  HCT 43.9 38.1*  PLT 152 122*    Coag's No results for input(s): APTT, INR in the last 168 hours.  Sepsis Markers  Recent Labs Lab Nov 27, 2015 1111  LATICACIDVEN 2.01*    ABG  Recent Labs Lab 11-27-15 1530 27-Nov-2015 1835 11/22/15 0753  PHART 7.168* 7.157* 7.378  PCO2ART 63.6* 54.7* 36.5  PO2ART 179* 299* 73.2*    Liver Enzymes  Recent Labs Lab 27-Nov-2015 1026 11/22/15 0345  AST 3,426*  --   ALT 2,108*  --   ALKPHOS 110  --   BILITOT 1.1  --   ALBUMIN 3.6 2.6*    Cardiac Enzymes  Recent Labs Lab 11/22/15 0203  TROPONINI 0.59*    Glucose  Recent Labs Lab 11-27-15 1600 11/22/15 0517 11/22/15 0829  GLUCAP 223* 272* 223*    Imaging US Renal  Result  Date: 11/20/2015 CLINICAL DATA:  Renal failure, acute on chronic, hypertension, diabetes mellitus, hyperlipidemia, coronary artery disease post MI, CHF, COPD, paroxysmal atrial fibrillation EXAM: RENAL / URINARY TRACT ULTRASOUND COMPLETE COMPARISON:  Abdomen ultrasound 03/28/2013 FINDINGS: Right Kidney: Length: 11.6 cm. Normal cortical thickness. Increased cortical echogenicity. Several tiny RIGHT renal cysts. No solid mass, hydronephrosis or shadowing calcification. Left Kidney: Length: 10.3 cm. Suboptimal visualization due to body habitus and limited positioning (patient on ventilator) poorly visualized. Normal cortical thickness. No gross evidence of mass or hydronephrosis. Bladder: Poorly distended, with suboptimal assessment of wall thickness.  Prostate enlargement with gland measuring 4.8 x 3.9 x 3.1 cm. IMPRESSION: Prostatic enlargement. Tiny RIGHT renal cyst. Question medical renal disease changes. Suboptimal visualization of LEFT kidney as above. Electronically Signed   By: Ulyses Southward M.D.   On: 11/09/2015 16:26   Dg Chest Port 1 View  Result Date: 11/14/2015 CLINICAL DATA:  Intubated EXAM: PORTABLE CHEST 1 VIEW COMPARISON:  Chest radiograph from earlier today. FINDINGS: Endotracheal tube tip is 4.1 cm above the carina. Right internal jugular central venous catheter terminates in the lower third of the superior vena cava. Sternotomy wires appear aligned and intact. Stable cardiomediastinal silhouette with normal heart size and atherosclerotic thoracic aorta. No pneumothorax. Stable small left pleural effusion and patchy left lung base opacity. No pulmonary edema. IMPRESSION: 1. Well-positioned support structures as described. No pneumothorax. 2. Stable patchy left lung base opacity, which could represent aspiration, pneumonia or atelectasis. 3. Probable stable small left pleural effusion. Electronically Signed   By: Delbert Phenix M.D.   On: 11/07/2015 18:02   Dg Chest Portable 1 View  Result Date: 11/24/2015 CLINICAL DATA:  Productive cough x 2-3 days, worsening of SOB last night; hx CHF, COPD, HTN, dementia, smoker; pt was very unstable, confused, tech was not able to position pt for better AP view. EXAM: PORTABLE CHEST 1 VIEW COMPARISON:  06/23/2011 FINDINGS: Cardiac silhouette is normal in size. There stable changes from previous CABG surgery. No mediastinal or hilar masses. There is hazy opacity in the left lung base. This a reflect atelectasis or pneumonia. Lungs otherwise clear. No convincing pleural effusion. No gross pneumothorax. The bony thorax is demineralized. There are old healed rib fractures on the left. IMPRESSION: 1. Hazy left lung base opacity which may reflect pneumonia or atelectasis. No other evidence of acute  cardiopulmonary disease. Electronically Signed   By: Amie Portland M.D.   On: 11/19/2015 10:46   STUDIES:   CULTURES: bld 9/16 >> Urine 9/16>>>  ANTIBIOTICS: Zosyn 9/16 > vanc 9/16 >>  SIGNIFICANT EVENTS: 9/16- upon arrival from [facility], intubated, shock, pressors  LINES/TUBES: 9/16 rt ij>>> 9/16 ETT>>>  DISCUSSION: Unclear cause of acute respiratory acidosis and liver transaminitis and this 75 year old smoker and alcoholic with COPD. Portable chest x-ray is difficult to interpret we'll treat for left-sided presumed HCAP  ASSESSMENT / PLAN:  PULMONARY A: Acute hypercarbic respiratory failure COPD LLL PNA P:   improved aeration LLL ABG reviewed, remain on low O2, rate to 24 Keep same TV SBT consideration, if able to have WUA  CARDIOVASCULAR A:  CAD Elevated troponin Shock likely septic, hypovolemic P:  Aspirin Trop peak am Levophed to map 60 Ensure cortisol cvp needed  RENAL A:   AKI - ? Etio, on ARB , likely ATN hypovolemia P:   Maintain pos balance Cl now 110, at risk NONAG, change to 1/2 NS May need renal US, follow trend further  GASTROINTESTINAL A:   Transaminitis ? Etio, sepsis?  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 Apparently son Moise BoringDonnie and patient are both alcoholics, daughter has'divorced' them for the last 3 months. Son unable to take care of the patient and put him in a nursing home-she does request full CODE STATUS at this point  Ccm  time 35 min   Mcarthur RossettiDaniel J. Tyson AliasFeinstein, MD, FACP Pgr: [phone] [facility]    11/22/2015, 8:48 AM

## 2015-11-23 ENCOUNTER — Inpatient Hospital Stay (HOSPITAL_COMMUNITY): Payer: Medicare Other

## 2015-11-23 DIAGNOSIS — R0600 Dyspnea, unspecified: Secondary | ICD-10-CM

## 2015-11-23 LAB — BASIC METABOLIC PANEL
Anion gap: 8 (ref 5–15)
Anion gap: 7 (ref 5–15)
BUN: 78 mg/dL — AB (ref 6–20)
BUN: 82 mg/dL — AB (ref 6–20)
CHLORIDE: 110 mmol/L (ref 101–111)
CHLORIDE: 111 mmol/L (ref 101–111)
CO2: 22 mmol/L (ref 22–32)
CO2: 23 mmol/L (ref 22–32)
Calcium: 7.8 mg/dL — ABNORMAL LOW (ref 8.9–10.3)
Calcium: 7.9 mg/dL — ABNORMAL LOW (ref 8.9–10.3)
Creatinine, Ser: 2.28 mg/dL — ABNORMAL HIGH (ref 0.61–1.24)
Creatinine, Ser: 2.03 mg/dL — ABNORMAL HIGH (ref 0.61–1.24)
GFR calc Af Amer: 30 mL/min — ABNORMAL LOW (ref >60)
GFR calc Af Amer: 35 mL/min — ABNORMAL LOW (ref >60)
GFR calc non Af Amer: 26 mL/min — ABNORMAL LOW (ref >60)
GFR calc non Af Amer: 30 mL/min — ABNORMAL LOW (ref >60)
GLUCOSE: 221 mg/dL — AB (ref 65–99)
Glucose, Bld: 146 mg/dL — ABNORMAL HIGH (ref 65–99)
POTASSIUM: 3.6 mmol/L (ref 3.5–5.1)
POTASSIUM: 4.1 mmol/L (ref 3.5–5.1)
SODIUM: 141 mmol/L (ref 135–145)
Sodium: 140 mmol/L (ref 135–145)

## 2015-11-23 LAB — GLUCOSE, CAPILLARY
GLUCOSE-CAPILLARY: 233 mg/dL — AB (ref 65–99)
GLUCOSE-CAPILLARY: 146 mg/dL — AB (ref 65–99)
Glucose-Capillary: 160 mg/dL — ABNORMAL HIGH (ref 65–99)
Glucose-Capillary: 169 mg/dL — ABNORMAL HIGH (ref 65–99)
Glucose-Capillary: 178 mg/dL — ABNORMAL HIGH (ref 65–99)
Glucose-Capillary: 232 mg/dL — ABNORMAL HIGH (ref 65–99)

## 2015-11-23 LAB — HEPATIC FUNCTION PANEL
ALBUMIN: 2.4 g/dL — AB (ref 3.5–5.0)
ALK PHOS: 73 U/L (ref 38–126)
ALT: 765 U/L — ABNORMAL HIGH (ref 17–63)
AST: 144 U/L — ABNORMAL HIGH (ref 15–41)
Bilirubin, Direct: 0.3 mg/dL (ref 0.1–0.5)
Indirect Bilirubin: 0.3 mg/dL (ref 0.3–0.9)
TOTAL PROTEIN: 5.1 g/dL — AB (ref 6.5–8.1)
Total Bilirubin: 0.6 mg/dL (ref 0.3–1.2)

## 2015-11-23 LAB — CBC WITH DIFFERENTIAL/PLATELET
Basophils Absolute: 0 10*3/uL (ref 0.0–0.1)
Basophils Relative: 0 %
EOS PCT: 0 %
Eosinophils Absolute: 0 10*3/uL (ref 0.0–0.7)
HCT: 38.9 % — ABNORMAL LOW (ref 39.0–52.0)
Hemoglobin: 12.6 g/dL — ABNORMAL LOW (ref 13.0–17.0)
LYMPHS ABS: 0.3 10*3/uL — AB (ref 0.7–4.0)
LYMPHS PCT: 3 %
MCH: 31.7 pg (ref 26.0–34.0)
MCHC: 32.4 g/dL (ref 30.0–36.0)
MCV: 97.7 fL (ref 78.0–100.0)
MONO ABS: 0.3 10*3/uL (ref 0.1–1.0)
MONOS PCT: 3 %
Neutro Abs: 9.9 10*3/uL — ABNORMAL HIGH (ref 1.7–7.7)
Neutrophils Relative %: 94 %
PLATELETS: 121 10*3/uL — AB (ref 150–400)
RBC: 3.98 MIL/uL — AB (ref 4.22–5.81)
RDW: 14.1 % (ref 11.5–15.5)
WBC: 10.5 10*3/uL (ref 4.0–10.5)

## 2015-11-23 LAB — URINE CULTURE: CULTURE: NO GROWTH

## 2015-11-23 LAB — TROPONIN I: Troponin I: 0.26 ng/mL (ref <0.03)

## 2015-11-23 LAB — HEPATITIS PANEL, ACUTE
HEP A IGM: NEGATIVE
HEP B S AG: NEGATIVE
Hep B C IgM: NEGATIVE

## 2015-11-23 LAB — MAGNESIUM
Magnesium: 2 mg/dL (ref 1.7–2.4)
Magnesium: 1.9 mg/dL (ref 1.7–2.4)

## 2015-11-23 LAB — PHOSPHORUS
PHOSPHORUS: 4.3 mg/dL (ref 2.5–4.6)
Phosphorus: 3.9 mg/dL (ref 2.5–4.6)

## 2015-11-23 MED ORDER — AMIODARONE HCL IN DEXTROSE 360-4.14 MG/200ML-% IV SOLN
30.0000 mg/h | INTRAVENOUS | Status: AC
  Administered 2015-11-23 – 2015-11-26 (×5): 30 mg/h via INTRAVENOUS
  Filled 2015-11-23 (×6): qty 200

## 2015-11-23 MED ORDER — ACETAMINOPHEN 650 MG RE SUPP
650.0000 mg | Freq: Four times a day (QID) | RECTAL | Status: DC | PRN
Start: 2015-11-23 — End: 2015-12-03
  Administered 2015-11-23: 650 mg via RECTAL
  Filled 2015-11-23: qty 1

## 2015-11-23 MED ORDER — FAMOTIDINE IN NACL 20-0.9 MG/50ML-% IV SOLN
20.0000 mg | INTRAVENOUS | Status: DC
  Administered 2015-11-24 – 2015-11-26 (×4): 20 mg via INTRAVENOUS
  Filled 2015-11-23 (×4): qty 50

## 2015-11-23 MED ORDER — AMIODARONE HCL IN DEXTROSE 360-4.14 MG/200ML-% IV SOLN
INTRAVENOUS | Status: AC
  Filled 2015-11-23: qty 200

## 2015-11-23 MED ORDER — PIPERACILLIN-TAZOBACTAM 3.375 G IVPB
3.3750 g | Freq: Three times a day (TID) | INTRAVENOUS | Status: DC
  Administered 2015-11-23: 3.375 g via INTRAVENOUS
  Filled 2015-11-23 (×2): qty 50

## 2015-11-23 MED ORDER — INSULIN GLARGINE 100 UNIT/ML ~~LOC~~ SOLN
8.0000 [IU] | Freq: Every day | SUBCUTANEOUS | Status: DC
  Administered 2015-11-23 – 2015-12-01 (×9): 8 [IU] via SUBCUTANEOUS
  Filled 2015-11-23 (×10): qty 0.08

## 2015-11-23 MED ORDER — BUDESONIDE 0.5 MG/2ML IN SUSP
0.5000 mg | Freq: Two times a day (BID) | RESPIRATORY_TRACT | Status: DC
  Administered 2015-11-23 – 2015-12-01 (×17): 0.5 mg via RESPIRATORY_TRACT
  Filled 2015-11-23 (×18): qty 2

## 2015-11-23 MED ORDER — METHYLPREDNISOLONE SODIUM SUCC 40 MG IJ SOLR
40.0000 mg | Freq: Four times a day (QID) | INTRAMUSCULAR | Status: DC
  Administered 2015-11-23 – 2015-11-26 (×10): 40 mg via INTRAVENOUS
  Filled 2015-11-23 (×10): qty 1

## 2015-11-23 MED ORDER — AMIODARONE LOAD VIA INFUSION
150.0000 mg | Freq: Once | INTRAVENOUS | Status: AC
Start: 2015-11-23 — End: 2015-11-23
  Administered 2015-11-23: 150 mg via INTRAVENOUS
  Filled 2015-11-23: qty 83.34

## 2015-11-23 MED ORDER — METOPROLOL TARTRATE 5 MG/5ML IV SOLN
2.5000 mg | Freq: Once | INTRAVENOUS | Status: AC
  Administered 2015-11-23: 2.5 mg via INTRAVENOUS
  Filled 2015-11-23: qty 5

## 2015-11-23 MED ORDER — AMIODARONE HCL IN DEXTROSE 360-4.14 MG/200ML-% IV SOLN
60.0000 mg/h | INTRAVENOUS | Status: AC
  Filled 2015-11-23: qty 200

## 2015-11-23 MED ORDER — SODIUM CHLORIDE 0.9 % IV BOLUS (SEPSIS)
250.0000 mL | Freq: Once | INTRAVENOUS | Status: AC
  Administered 2015-11-23: 250 mL via INTRAVENOUS

## 2015-11-23 MED ORDER — IPRATROPIUM BROMIDE 0.02 % IN SOLN
2.5000 mL | RESPIRATORY_TRACT | Status: DC
  Administered 2015-11-23 – 2015-11-25 (×9): 0.5 mg via RESPIRATORY_TRACT
  Filled 2015-11-23 (×10): qty 2.5

## 2015-11-23 MED ORDER — VANCOMYCIN HCL IN DEXTROSE 750-5 MG/150ML-% IV SOLN
750.0000 mg | INTRAVENOUS | Status: DC
  Administered 2015-11-23: 750 mg via INTRAVENOUS
  Filled 2015-11-23: qty 150

## 2015-11-23 MED ORDER — DEXTROSE 5 % IV SOLN
1.0000 g | INTRAVENOUS | Status: AC
  Administered 2015-11-23 – 2015-11-28 (×6): 1 g via INTRAVENOUS
  Filled 2015-11-23 (×7): qty 10

## 2015-11-23 MED ORDER — AMIODARONE LOAD VIA INFUSION
150.0000 mg | Freq: Once | INTRAVENOUS | Status: AC
  Administered 2015-11-23: 150 mg via INTRAVENOUS
  Filled 2015-11-23: qty 83.34

## 2015-11-23 NOTE — Procedures (Addendum)
Extubation Procedure Note  Patient Details:   Name: Bryan BunnellDonald G [name] DOB: 01/12/1941 MRN: [id number]   Airway Documentation:     Evaluation  O2 sats: stable throughout Complications: No apparent complications Patient did tolerate procedure well. Bilateral Breath Sounds: Rhonchi, Diminished   No   Pt not speaking. Positive cuff leak noted prior to extubation. Pt placed on [place] 4 Lpm with humidity. No stridor noted. Pt able to get 600 using incentive spirometer.  [name] 11/23/2015, 12:52 PM

## 2015-11-23 NOTE — Progress Notes (Signed)
Updated Elink MD of persistent Afib RVR (although better than original rate) despite repeat boluses of Amiodarone. Pressure also soft but MAP in the 60s. MD also aware of afternoon labs and minimal UOP. Orders received. Will continue to monitor.

## 2015-11-23 NOTE — Progress Notes (Signed)
Patient tachycardic 130s-160. BP 80s-90s/50s-60s. Patient in NAD, resting quietly. Dr. Tyson AliasFeinstein notified. Order received for stat EKG, confirmed A-fib with RVR. MD aware and order received for Amiodarone bolus followed by infusion. Bolus infusing currently. Will continue to monitor.

## 2015-11-23 NOTE — Progress Notes (Signed)
Patient ID: [name], male   DOB: 1941/01/15, 75 y.o.   MRN: [id number]  [facility] Progress Note    Subjective:   Pt extubated this AM Babbling, confused   Objective:   BP 90/62   Pulse 76   Temp 98.5 F (36.9 C) (Axillary)   Resp 10   Wt 63.3 kg (139 lb 8.8 oz)   SpO2 98%   BMI 18.41 kg/m   Intake/Output Summary (Last 24 hours) at 11/23/15 1343 Last data filed at 11/23/15 1100  Gross per 24 hour  Intake          3730.08 ml  Output              975 ml  Net          2755.08 ml   Weight change: -11.4 kg (-25 lb 2.1 oz)  Physical Exam: VS as noted Confused, pleasant but makes little sense Hands in mitts "In a doctors office" Diffuse exp wheezing AF w/RVR Abd soft No edema of stumps  Imaging: US Renal  Result Date: 11/20/2015 CLINICAL DATA:  Renal failure, acute on chronic, hypertension, diabetes mellitus, hyperlipidemia, coronary artery disease post MI, CHF, COPD, paroxysmal atrial fibrillation EXAM: RENAL / URINARY TRACT ULTRASOUND COMPLETE COMPARISON:  Abdomen ultrasound 03/28/2013 FINDINGS: Right Kidney: Length: 11.6 cm. Normal cortical thickness. Increased cortical echogenicity. Several tiny RIGHT renal cysts. No solid mass, hydronephrosis or shadowing calcification. Left Kidney: Length: 10.3 cm. Suboptimal visualization due to body habitus and limited positioning (patient on ventilator) poorly visualized. Normal cortical thickness. No gross evidence of mass or hydronephrosis. Bladder: Poorly distended, with suboptimal assessment of wall thickness. Prostate enlargement with gland measuring 4.8 x 3.9 x 3.1 cm. IMPRESSION: Prostatic enlargement. Tiny RIGHT renal cyst. Question medical renal disease changes. Suboptimal visualization of LEFT kidney as above. Electronically Signed   By: Lavonia Dana M.D.   On: 11/08/2015 16:26   Dg Chest Port 1 View  Result Date: 11/23/2015 CLINICAL DATA:  Status post intubation EXAM: PORTABLE CHEST 1 VIEW  COMPARISON:  11/22/2015 FINDINGS: Cardiac shadow is stable. Endotracheal tube, nasogastric catheter and right jugular line are again seen and stable. Lungs are well aerated bilaterally. Increasing right basilar atelectasis is noted. Persistent left basilar infiltrate is noted. Old rib fractures are seen on the left. IMPRESSION: Stable left basilar infiltrate. Increasing right basilar atelectasis. Tubes and lines as described. Electronically Signed   By: Inez Catalina M.D.   On: 11/23/2015 07:55   Dg Chest Port 1 View  Result Date: 11/22/2015 CLINICAL DATA:  75 year old male with respiratory failure. EXAM: PORTABLE CHEST 1 VIEW COMPARISON:  11/11/2015 and prior radiograph FINDINGS: An endotracheal tube with tip 4.8 cm above the carina, NG tube entering the stomach with tip off the field of view and right IJ central venous catheter with tip overlying the mid -lower SVC again noted. Cardiomediastinal silhouette is unchanged. CABG changes again noted. Left lower lung airspace disease and small left pleural effusion again noted. There has been little interval change since the prior study. . IMPRESSION: No significant change. Continued left lower lung airspace disease and small left pleural effusion. Electronically Signed   By: Margarette Canada M.D.   On: 11/22/2015 09:02   Dg Chest Port 1 View  Result Date: 11/27/2015 CLINICAL DATA:  Intubated EXAM: PORTABLE CHEST 1 VIEW COMPARISON:  Chest radiograph from earlier today. FINDINGS: Endotracheal tube tip is 4.1 cm above the carina. Right internal jugular central venous catheter terminates in the lower third  of the superior vena cava. Sternotomy wires appear aligned and intact. Stable cardiomediastinal silhouette with normal heart size and atherosclerotic thoracic aorta. No pneumothorax. Stable small left pleural effusion and patchy left lung base opacity. No pulmonary edema. IMPRESSION: 1. Well-positioned support structures as described. No pneumothorax. 2. Stable patchy  left lung base opacity, which could represent aspiration, pneumonia or atelectasis. 3. Probable stable small left pleural effusion. Electronically Signed   By: Ilona Sorrel M.D.   On: 11/19/2015 18:02    Labs: BMET  Recent Labs Lab 11/12/2015 1026 11/22/15 0345 11/22/15 1030 11/22/15 1255 11/22/15 1715 11/23/15 0445  NA 141 142  --  143 140 140  K 4.7 3.9  --  3.5 3.5 3.6  CL 102 110  --  113* 111 110  CO2 24 21*  --  22 21* 22  GLUCOSE 183* 318*  --  202* 240* 221*  BUN 86* 82*  --  78* 79* 78*  CREATININE 5.13* 3.99*  --  3.20* 2.87* 2.28*  CALCIUM 8.3* 7.4*  --  7.6* 7.7* 7.8*  PHOS  --  5.3* 4.4  --  3.9 3.9   CBC  Recent Labs Lab 11/15/2015 1026 11/22/15 0345 11/23/15 0445  WBC 8.3 6.9 10.5  NEUTROABS 6.9  --  9.9*  HGB 14.1 12.2* 12.6*  HCT 43.9 38.1* 38.9*  MCV 100.9* 98.2 97.7  PLT 152 122* 121*    Medications:    . albuterol  5 mg Nebulization Once  . amiodarone      . amiodarone  150 mg Intravenous Once  . cefTRIAXone (ROCEPHIN)  IV  1 g Intravenous Q24H  . chlorhexidine gluconate (MEDLINE KIT)  15 mL Mouth Rinse BID  . famotidine (PEPCID) IV  20 mg Intravenous Q24H  . feeding supplement (PRO-STAT SUGAR FREE 64)  30 mL Per Tube BID  . feeding supplement (VITAL HIGH PROTEIN)  1,000 mL Per Tube Q24H  . fentaNYL (SUBLIMAZE) injection  50 mcg Intravenous Once  . heparin  5,000 Units Subcutaneous Q8H  . insulin aspart  0-9 Units Subcutaneous Q4H  . insulin glargine  8 Units Subcutaneous Daily  . ipratropium-albuterol  3 mL Nebulization Q6H  . mouth rinse  15 mL Mouth Rinse QID  . methylPREDNISolone (SOLU-MEDROL) injection  40 mg Intravenous Q12H  . pantoprazole sodium  40 mg Per Tube Daily  . sodium chloride  500 mL Intravenous Once  . sodium chloride  500 mL Intravenous Once   Infusions . sodium chloride 75 mL/hr at 11/23/15 1100  . amiodarone 60 mg/hr (11/23/15 1339)   Followed by  . amiodarone    . fentaNYL infusion INTRAVENOUS Stopped (11/23/15  1120)  . norepinephrine (LEVOPHED) Adult infusion Stopped (11/23/15 0900)   Background 75 year old Caucasian man with past medical history significant for COPD (ongoing smoker), CAD status post CABG with ischemic cardiomyopathy and CHF, ongoing alcohol use, PAD status post bilateral BKA's, dementia and what appears to be baseline chronic kidney disease with a creatinine of around 1.4. (His daughter reports that he saw Dr. Hassell Done (nephrologist) several years ago when he was told that he had stage II kidney disease).  Brought to ED from SNF for resp distress, with acute resp acidosis, lactic acidosis, transaminasitis.  Found to have creatinine of 5.13, BUN 86 and we were asked to see.  He was taking Benicar 20 mg daily prior to admission.  Creatinine has steadily improved since admission.  Assessment/ Plan:    Acute renal failure on chronic  kidney disease stage III:  Appears hemodynamically mediated with lower respiratory tract infection/sustained pre-azotemia probably evolving into ATN. On ARB PTA. Has rec'd slow IVF - creatinine continues to trend down - 2.28 today Reasonable urine output and he does not have any acute electrolyte abnormalities to prompt intervention.  Agree with holding ARB and gabapentin at this time. Continue 1/2 NS at 74  Hypercarbic respiratory failure with respiratory acidosis LLL PNA With history of COPD and ongoing tobacco use Extubated this AM On rocephin  Elevated transaminases:  History provided of significant and ongoing alcohol and statin use.   CAD/Elevated troponi/AF:  Probably from demand ischemia with respiratory failure/acute on chronic renal failure Amio for AF Management per CCM.  Given the continued improvement in renal function, and management of fluids by CCM, renal will sign off. Please call if we can assist further. WOULD NOT RESTART BENICAR OR ANY ACE/ARB AFTER DISCHARGE.  Jamal Maes, MD [facility] [phone]  Pager 11/23/2015, 2:08 PM

## 2015-11-23 NOTE — Progress Notes (Signed)
eLink Physician-Brief Progress Note Patient Name: Bryan BunnellDonald G [name] DOB: 12/08/1940 MRN: [id number]   Date of Service  11/23/2015  HPI/Events of Note  Nurse calls for her rapid A. fib.  Patient extubated this afternoon. Postextubation, he went into rapid atrial fibrillation. He has since been loaded with amiodarone and was rebolused because of heart rate remains in the 120s -140s.  Blood pressure 88/69. Blood pressure has been soft since he's been in rapid A. fib.   Patient seen, comfortable, not in distress. On nasal cannula.   Creatinine elevated but better at 2. Making 30 MLS of urine an hour. He was also given 250  mL saline bolus.   eICU Interventions  Will try Lopressor 2.5 mg IV 1.  May need more IVF.  Levophed has been held > may need to restart.      Intervention Category Intermediate Interventions: Other:  Daneen SchickJose Angelo A De Dios 11/23/2015, 5:01 PM

## 2015-11-23 NOTE — Progress Notes (Signed)
Wasted fentanyl drip remainder in sink, 65 mL. Berniece Paponnie Dupont RN as witness.

## 2015-11-23 NOTE — Progress Notes (Signed)
Initial Nutrition Assessment  DOCUMENTATION CODES:   Non-severe (moderate) malnutrition in context of chronic illness  INTERVENTION:  Diet advancement per MD and team as appropriate.   If unable to advance diet and enteral/tube feeding is needed, recommend Vital 1.5 formula at goal rate of 55 ml/h (1320 ml per day) to provide 1980  kcals, 89 gm protein, 1003 ml free water daily.  NUTRITION DIAGNOSIS:   Inadequate oral intake related to inability to eat as evidenced by NPO status.  GOAL:   Patient will meet greater than or equal to 90% of their needs  MONITOR:   Diet advancement, Labs, Weight trends, Skin, I & O's  REASON FOR ASSESSMENT:   Consult Enteral/tube feeding initiation and management  ASSESSMENT:   75 year old smoker and alcoholic, nursing home resident since 08/2015 brought in by EMS to ED for respiratory distress. He has a known history of COPD and CHF. There is a report of dementia and his meds show Aricept. ED evaluation showed acute respiratory acidosis on VBG and he was placed on BiPAP, he was given empiric antibiotics for left-sided infiltrate on portable fil m which have reviewed. Labs showed evidence of acute renal failure and transaminitis, mild lactic acidosis and slight elevated troponin  RD consulted for enteral/tube feeding management. Pt has just been extubated however. No family at bedside during time of visit. RD unable to obtain nutrition history. If diet is unable to be advanced, recommend Vital 1.5 formula at goal rate of 55 ml/hr to provide 1980 kcal, 89 grams of protein, 1103 ml of free water.   Limited Nutrition-Focused physical exam completed as pt started to become agitated when examining body. Findings are moderate fat depletion, moderate muscle depletion, and no edema.   Labs and medications reviewed.   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  Estimated Nutritional Needs:   Kcal:  1900-2100  Protein:  90-100 grams  Fluid:  1.9 - 2.1 L/day  EDUCATION NEEDS:   No education needs identified at this time  Roslyn SmilingStephanie Ronte Parker, MS, RD, LDN Pager # [phone] After hours/ weekend pager # [phone]

## 2015-11-23 NOTE — Progress Notes (Signed)
Pharmacy Antibiotic Follow-up Note  Maryfrances BunnellDonald G Poli is a 75 y.o. year-old male admitted on 11/16/2015.  Initially started on vancomycin and cefepime but now changed to vancomycin and zosyn. Pt is afebrile and WBC is WNL. Scr is now trending down to 2.28.   Assessment/Plan: - Change vancomycin to 750mg  IV Q24H - Zosyn 3.375gm IV Q8H (4 hr inf) - F/u renal fxn, C&S, clinical status and trough at SS  Temp (24hrs), Avg:98.1 F (36.7 C), Min:97.9 F (36.6 C), Max:98.5 F (36.9 C)   Recent Labs Lab 11/29/2015 1026 11/22/15 0345 11/23/15 0445  WBC 8.3 6.9 10.5     Recent Labs Lab 11/25/2015 1026 11/22/15 0345 11/22/15 1255 11/22/15 1715 11/23/15 0445  CREATININE 5.13* 3.99* 3.20* 2.87* 2.28*   CrCl cannot be calculated (Unknown ideal weight.).    Allergies  Allergen Reactions  . Ibuprofen Other (See Comments)    Can't take because of KIDNEY function  . Iohexol      Desc: PT IS NOT ALLERGIC TO CONTRAST- PT HAS BEEN TOLD BY DR FOX TO NOT BE GIVEN CONTRAST DUE TO RENAL INSUFF.  STEPHANIE DAVIS,RT-RCT., Onset Date: [id number]   . Wellbutrin [Bupropion Hcl]    Antimicrobials this admission:  9/16 Vancomycin >> 9/16 Cefepime >> 9/18 Zosyn 9/18>>  Levels/dose changes this admission:  Microbiology results: 9/16 BCx: NGTD 9/17 TA>> rare GPC in pairs 9/16 MRSA - NEG  Thank you for allowing pharmacy to be a part of this patient's care.  Sukhdeep Wieting, Drake Leachachel Lynn PharmD 11/23/2015 8:11 AM

## 2015-11-23 NOTE — Progress Notes (Signed)
Patient coughing continuously. He is complaining of 10/10 pain all over. He is becoming more confused and uncooperative with decreasing oxygenation. MD notified. [facility]. Ripley FraiseHarper, Markelle Asaro D

## 2015-11-23 NOTE — Progress Notes (Signed)
Patient pulled out central line with safety mitts on. Vaseline gauze placed and sutures removed on neck. Cough is becoming increasingly worse with more confusion. Heart rate is sustaining in the 120s-130s. Patient is now only oriented to person and [place]. Amiodarone paused. MD notified. New orders in EMR. Ripley FraiseHarper, Kadi Hession D

## 2015-11-23 NOTE — H&P (Signed)
PULMONARY / CRITICAL CARE MEDICINE   Name: [name] MRN: [id number] DOB: [birth year]    ADMISSION DATE:  11/06/2015 CONSULTATION DATE:  11/23/2015  REFERRING MD:  ED, Long MD   CHIEF COMPLAINT:  Respiratory distress  HISTORY OF PRESENT ILLNESS:   75 year old smoker and alcoholic, nursing home resident since 08/2015 brought in by EMS to ED for respiratory distress. He has a known history of COPD and CHF. There is a report of dementia and his meds show Aricept. ED evaluation showed acute respiratory acidosis on VBG and he was placed on BiPAP, he was given empiric antibiotics for left-sided infiltrate on portable fil m which have reviewed. Labs showed evidence of acute renal failure and transaminitis, mild lactic acidosis and slight elevated troponin EKG showed nonspecific T-wave abnormalities in anterolateral leads  Unable to obtain full review of systems since he is is on a BiPAP. Nursing home reports productive cough for past few days. 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 He continues to smoke and drink heavily. I note that can of beer cribed to him daily at the nursing home  Subject: Urine output up Levo needed Some agitation overnight, required versed.   VITAL SIGNS: BP 98/61   Pulse 89   Temp 98 F (36.7 C) (Axillary)   Resp (!) 24   Wt 139 lb 8.8 oz (63.3 kg)   SpO2 100%   BMI 18.41 kg/m   HEMODYNAMICS: CVP:  [11 mmHg-14 mmHg] 12 mmHg  VENTILATOR SETTINGS: Vent Mode: PRVC FiO2 (%):  [40 %] 40 % Set Rate:  [24 bmp] 24 bmp Vt Set:  [500 mL] 500 mL PEEP:  [8 cmH20] 8 cmH20 Plateau Pressure:  [20 cmH20-21 cmH20] 21 cmH20  INTAKE / OUTPUT: I/O last 3 completed shifts: In: 5485.1 [I.V.:4459.8; NG/GT:825.3; IV Piggyback:200] Out: 1895 [Urine:1895]  PHYSICAL EXAMINATION: General:   elderly, bilateral amputee, on BiPAP full face mask in moderate distress  Neuro: mild  agitation HEENT:  No JVD, dry mucosa CardiovasculS1-S2 normal LUng: coarse bs Abdomen:  Soft nontender, no r/g loskeletal: Bilateral amputee   Skin no rash  LABS:  BMET  Recent Labs Lab 11/22/15 1255 11/22/15 1715 11/23/15 0445  NA 143 140 140  K 3.5 3.5 3.6  CL 113* 111 110  CO2 22 21* 22  BUN 78* 79* 78*  CREATININE 3.20* 2.87* 2.28*  GLUCOSE 202* 240* 221*    Electrolytes  Recent Labs Lab 11/22/15 1030 11/22/15 1255 11/22/15 1715 11/23/15 0445  CALCIUM  --  7.6* 7.7* 7.8*  MG 2.1  --  2.0 2.0  PHOS 4.4  --  3.9 3.9    CBC  Recent Labs Lab 11/27/2015 1026 11/22/15 0345 11/23/15 0445  WBC 8.3 6.9 10.5  HGB 14.1 12.2* 12.6*  HCT 43.9 38.1* 38.9*  PLT 152 122* 121*    Coag's No results for input(s): APTT, INR in the last 168 hours.  Sepsis Markers  Recent Labs Lab 11/18/2015 1111  LATICACIDVEN 2.01*    ABG  Recent Labs Lab 11/13/2015 1530 11/27/2015 1835 11/22/15 0753  PHART 7.168* 7.157* 7.378  PCO2ART 63.6* 54.7* 36.5  PO2ART 179* 299* 73.2*    Liver Enzymes  Recent Labs Lab 11/29/2015 1026 11/22/15 0345 11/22/15 1030  AST 3,426*  --  590*  ALT 2,108*  --  1,191*  ALKPHOS 110  --  84  BILITOT 1.1  --  0.8  ALBUMIN 3.6 2.6* 2.4*  Cardiac Enzymes  Recent Labs Lab 11/22/15 0203  TROPONINI 0.59*    Glucose  Recent Labs Lab 11/22/15 0829 11/22/15 1113 11/22/15 1613 11/22/15 1921 11/22/15 2320 11/23/15 0315  GLUCAP 223* 189* 196* 214* 213* 232*    Imaging Dg Chest Port 1 View  Result Date: 11/23/2015 CLINICAL DATA:  Status post intubation EXAM: PORTABLE CHEST 1 VIEW COMPARISON:  11/22/2015 FINDINGS: Cardiac shadow is stable. Endotracheal tube, nasogastric catheter and right jugular line are again seen and stable. Lungs are well aerated bilaterally. Increasing right basilar atelectasis is noted. Persistent left basilar infiltrate is noted. Old rib fractures are seen on the left. IMPRESSION: Stable left basilar  infiltrate. Increasing right basilar atelectasis. Tubes and lines as described. Electronically Signed   By: Alcide Clever M.D.   On: 11/23/2015 07:55   STUDIES:   CULTURES: bld 9/16 >> Urine 9/16>>>  ANTIBIOTICS: Zosyn 9/16 > vanc 9/16 >>9/18  SIGNIFICANT EVENTS: 9/16- upon arrival from [facility], intubated, shock, pressors  LINES/TUBES: 9/16 rt ij>>> 9/16 ETT>>>  DISCUSSION: Unclear cause of acute respiratory acidosis and liver transaminitis and this 75 year old smoker and alcoholic with COPD. Portable chest x-ray is difficult to interpret we'll treat for left-sided presumed HCAP  ASSESSMENT / PLAN:  PULMONARY A: Acute hypercarbic respiratory failure COPD LLL PNA P:   improved aeration LLL ABG reviewed, remain on low O2, rate to 24 Keep same TV SBT consideration, if able to have WUA  CARDIOVASCULAR A:  CAD Elevated troponin Shock likely septic, hypovolemic P:  Aspirin Recheck trop Levophed to map 60 cvp needed  RENAL A:   AKI - ? Etio, on ARB , likely ATN - improving  hypovolemia P:   Maintain pos balance CO2 improved with switching fluid, cont 1/2 NS.  Defer further w/up at this time.   GASTROINTESTINAL A:   Transaminitis ? Etio, sepsis?  ETOH, statin Follow LFTs now - ordered for today  Start feeds Acute hep panel - pending   HEMATOLOGIC A:   Mild thrombocytopenia (h/o etoh) P:  Do heparin dvt ppx dose NO lov with arf  INFECTIOUS A:   Severe sepsis, source - HCAP P:   Empiric zosyn/ vanc- maintain Get sputum if able  ENDOCRINE A:   DM-2   P:   phalopathy tsh p Cortisol ok.   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 Apparently son [name] and patient are both alcoholics, daughter has'divorced' them for the last 3 months. Son unable to take care of the patient  and put him in a nursing home-she does request full CODE STATUS at this point  Ccm time 35 min   Mcarthur Rossetti. Tyson Alias, MD, FACP Pgr: [phone] [facility]    11/23/2015, 8:20 AM   STAFF NOTE: Bryan Carbon, MD FACP have personally reviewed patient's available data, including medical history, events of note, physical examination and test results as part of my evaluation. I have discussed with resident/NP and other care providers such as pharmacist, RN and RRT. In addition, I personally evaluated patient and elicited key findings of: fc, ronchi left base, improved pna LLL, ATX rt base, rate to 20, if able to peep 5, if successful repeat wean cpap 5-8, ps 5, goal 2 hours, needs a goals of care discussion, LFt down, await hep panel, may need [place] abdo, culture neg, change  ceftriaxone, total abx 8 days planned, reduce fluids slight, follow crt trend further, feeding, increase insulin The patient is critically ill with multiple organ systems failure and requires high complexity decision making for assessment and support, frequent evaluation and titration of therapies, application of advanced monitoring technologies and extensive interpretation of multiple databases.   Critical Care Time devoted to patient care services described in this note is 30 Minutes. This time reflects time of care of this signee: Rory Percyaniel Lateisha Thurlow, MD FACP. This critical care time does not reflect procedure time, or teaching time or supervisory time of PA/NP/Med student/Med Resident etc but could involve care discussion time. Rest per NP/medical resident whose note is outlined above and that I agree with   Mcarthur Rossettianiel J. Tyson AliasFeinstein, MD, FACP Pgr: [phone] [facility] 11/23/2015 9:24 AM

## 2015-11-23 NOTE — Care Management Note (Signed)
Case Management Note  Patient Details  Name: [name] MRN: [id number] Date of Birth: 10/25/1940  Subjective/Objective:  Pt admitted on 11/20/2015 with acute hypercarbic respiratory failure.  PTA, [facility]                    Action/Plan: CSW consulted to facilitate return to SNF upon medical stability.  Will follow/assist with discharge planning as pt progresses.    Expected Discharge Date:                  Expected Discharge Plan:  Skilled Nursing Facility  In-House Referral:  Clinical Social Work  Discharge planning Services  CM Consult  Post Acute Care Choice:    Choice offered to:     DME Arranged:    DME Agency:     HH Arranged:    HH Agency:     Status of Service:  In process, will continue to follow  If discussed at Long Length of Stay Meetings, dates discussed:    Additional Comments:  Quintella BatonJulie W. Jahsiah Carpenter, RN, BSN  Trauma/Neuro ICU Case Manager [phone]

## 2015-11-23 NOTE — Progress Notes (Signed)
eLink Physician-Brief Progress Note Patient Name: Bryan BunnellDonald G Klecker DOB: 02/25/1941 MRN: [id number]   Date of Service  11/23/2015  HPI/Events of Note  Pt extubated this noon. Complaining of vague abd pain. Coughing also.   eICU Interventions  Tylenol suppository prn.      Intervention Category Intermediate Interventions: Other:  Daneen SchickJose Angelo A De Dios 11/23/2015, 8:45 PM

## 2015-11-23 NOTE — Progress Notes (Signed)
Repeated Amiodarone bolus 150 mg IV per Dr. Tyson AliasFeinstein d/t persistent A-fib RVR. MD aware BP 70s-80s with MAP low 60s. Will continue to monitor.

## 2015-11-23 NOTE — Progress Notes (Signed)
PT Cancellation Note  Patient Details Name: [name] MRN: [id number] DOB: 07/26/1940   Cancelled Treatment:    Reason Eval/Treat Not Completed: Patient not medically ready, elevated HR with difficulty controlling per nsg will hold today   Fabio AsaWerner, Ashima Shrake J 11/23/2015, 5:03 PM Charlotte Crumbevon Nai Borromeo, PT DPT  [phone]

## 2015-11-23 NOTE — Progress Notes (Signed)
eLink Physician-Brief Progress Note Patient Name: Maryfrances BunnellDonald G Haas DOB: 02/02/1941 MRN: [id number]   Date of Service  11/23/2015  HPI/Events of Note  Pt pulled his R IJ. Pt seen, in mild distress. Nurse applying pressure to R IJ 100/70, HR 128 afib, RR 20, 96% on 2L Pt has baseline dementia.   eICU Interventions  Plan for PIV >> if not successful, may need IJ Bipap 10/5 now for AECOPD. (wheezing worse per RN). Increase steroids.  Will d/c duoneb 2/2 rapid afib. Start atrovent q4.  Start pulmicort neb BID.      Intervention Category Intermediate Interventions: Other:  Louann SjogrenJose Angelo A De Dios 11/23/2015, 9:21 PM

## 2015-11-23 NOTE — Progress Notes (Signed)
Vent changes per Dr. Gwendolyn GrantFeinstein's request.

## 2015-11-24 ENCOUNTER — Inpatient Hospital Stay (HOSPITAL_COMMUNITY): Payer: Medicare Other

## 2015-11-24 DIAGNOSIS — M6281 Muscle weakness (generalized): Secondary | ICD-10-CM | POA: Diagnosis not present

## 2015-11-24 DIAGNOSIS — M25512 Pain in left shoulder: Secondary | ICD-10-CM | POA: Diagnosis not present

## 2015-11-24 DIAGNOSIS — M25511 Pain in right shoulder: Secondary | ICD-10-CM | POA: Diagnosis not present

## 2015-11-24 DIAGNOSIS — E440 Moderate protein-calorie malnutrition: Secondary | ICD-10-CM | POA: Insufficient documentation

## 2015-11-24 LAB — BLOOD GAS, ARTERIAL
ACID-BASE DEFICIT: 3.7 mmol/L — AB (ref 0.0–2.0)
Bicarbonate: 21 mmol/L (ref 20.0–28.0)
DELIVERY SYSTEMS: POSITIVE
DRAWN BY: 449561
Expiratory PAP: 5
FIO2: 40
INSPIRATORY PAP: 10
MODE: POSITIVE
O2 Saturation: 97.5 %
PH ART: 7.348 — AB (ref 7.350–7.450)
Patient temperature: 98.2
pCO2 arterial: 39.2 mmHg (ref 32.0–48.0)
pO2, Arterial: 111 mmHg — ABNORMAL HIGH (ref 83.0–108.0)

## 2015-11-24 LAB — GLUCOSE, CAPILLARY
GLUCOSE-CAPILLARY: 115 mg/dL — AB (ref 65–99)
GLUCOSE-CAPILLARY: 169 mg/dL — AB (ref 65–99)
Glucose-Capillary: 133 mg/dL — ABNORMAL HIGH (ref 65–99)
Glucose-Capillary: 145 mg/dL — ABNORMAL HIGH (ref 65–99)
Glucose-Capillary: 110 mg/dL — ABNORMAL HIGH (ref 65–99)
Glucose-Capillary: 118 mg/dL — ABNORMAL HIGH (ref 65–99)

## 2015-11-24 LAB — TROPONIN I
TROPONIN I: 0.19 ng/mL — AB (ref <0.03)
TROPONIN I: 0.19 ng/mL — AB (ref <0.03)
TROPONIN I: 0.15 ng/mL — AB (ref <0.03)

## 2015-11-24 LAB — TSH: TSH: 0.457 u[IU]/mL (ref 0.350–4.500)

## 2015-11-24 MED ORDER — AMIODARONE IV BOLUS ONLY 150 MG/100ML
150.0000 mg | Freq: Once | INTRAVENOUS | Status: AC
  Administered 2015-11-24: 150 mg via INTRAVENOUS
  Filled 2015-11-24: qty 100

## 2015-11-24 MED ORDER — METOPROLOL TARTRATE 5 MG/5ML IV SOLN
5.0000 mg | Freq: Four times a day (QID) | INTRAVENOUS | Status: DC
  Administered 2015-11-24 – 2015-11-25 (×6): 5 mg via INTRAVENOUS
  Filled 2015-11-24 (×6): qty 5

## 2015-11-24 MED ORDER — DIGOXIN 0.25 MG/ML IJ SOLN
0.2500 mg | Freq: Once | INTRAMUSCULAR | Status: AC
  Administered 2015-11-24: 0.25 mg via INTRAVENOUS
  Filled 2015-11-24: qty 2

## 2015-11-24 MED ORDER — HALOPERIDOL LACTATE 5 MG/ML IJ SOLN
1.0000 mg | INTRAMUSCULAR | Status: DC | PRN
  Administered 2015-11-24 (×3): 1 mg via INTRAVENOUS
  Filled 2015-11-24 (×4): qty 1

## 2015-11-24 MED ORDER — DIGOXIN 0.25 MG/ML IJ SOLN
0.5000 mg | Freq: Every day | INTRAMUSCULAR | Status: DC
  Administered 2015-11-24 – 2015-11-26 (×3): 0.5 mg via INTRAVENOUS
  Filled 2015-11-24 (×3): qty 2

## 2015-11-24 NOTE — Care Management Important Message (Signed)
Important Message  Patient Details  Name: Bryan BunnellDonald G [name] MRN: [id number] Date of Birth: 09/11/1940   Medicare Important Message Given:  Other (see comment)    [name] 11/24/2015, 9:46 AM

## 2015-11-24 NOTE — Progress Notes (Signed)
Patient removed BiPap. 4L [place] placed to see if patient could maintain O2. He began to cough and require accessory muscles to take breaths. HR has also maintained in the 130s. MD notified. Will placed patient back on BiPap. Ripley FraiseHarper, Kailan Carmen D

## 2015-11-24 NOTE — Progress Notes (Addendum)
PULMONARY / CRITICAL CARE MEDICINE   Name: [name] MRN: [id number] DOB: [birth year]    ADMISSION DATE:  11-22-15 CONSULTATION DATE:  11/24/2015  REFERRING MD:  ED, Long MD   CHIEF COMPLAINT:  Respiratory distress  HISTORY OF PRESENT ILLNESS:   75 year old smoker and alcoholic, nursing home resident since 08/2015 brought in by EMS to ED for respiratory distress. He has a known history of COPD and CHF. There is a report of dementia and his meds show Aricept. ED evaluation showed acute respiratory acidosis on VBG and he was placed on BiPAP, he was given empiric antibiotics for left-sided infiltrate on portable fil m which have reviewed. Labs showed evidence of acute renal failure and transaminitis, mild lactic acidosis and slight elevated troponin EKG showed nonspecific T-wave abnormalities in anterolateral leads  Unable to obtain full review of systems since he is is on a BiPAP. Nursing home reports productive cough for past few days. 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 He continues to smoke and drink heavily. I note that can of beer cribed to him daily at the nursing home  Subject: Urine output up Off levo, extubated Had increased WOB overnight.  Got NIMV amio for rvr  VITAL SIGNS: BP 125/82   Pulse (!) 133   Temp 98.2 F (36.8 C) (Axillary)   Resp 13   Ht 4' (1.219 m)   Wt 146 lb 9.7 oz (66.5 kg)   SpO2 (!) 89%   BMI 44.74 kg/m   HEMODYNAMICS: CVP:  [11 mmHg-13 mmHg] 13 mmHg  VENTILATOR SETTINGS: Vent Mode: BIPAP FiO2 (%):  [40 %] 40 % Set Rate:  [20 bmp] 20 bmp Vt Set:  [500 mL] 500 mL PEEP:  [5 cmH20] 5 cmH20 Pressure Support:  [5 cmH20] 5 cmH20  INTAKE / OUTPUT: I/O last 3 completed shifts: In: 4567.2 [I.V.:3897.2; NG/GT:520; IV Piggyback:150] Out: 1460 [Urine:1460]  PHYSICAL EXAMINATION: General:   elderly, bilateral amputee, on BiPAP full face mask in moderate  distress  Neuro: mild agitation on nimv HEENT:  No JVD, dry mucosa CardiovasculS1-S2 normal LUng: coarse bs Abdomen:  Soft nontender, no r/g loskeletal: Bilateral amputee   Skin no rash  LABS:  BMET  Recent Labs Lab 11/22/15 1715 11/23/15 0445 11/23/15 1543  NA 140 140 141  K 3.5 3.6 4.1  CL 111 110 111  CO2 21* 22 23  BUN 79* 78* 82*  CREATININE 2.87* 2.28* 2.03*  GLUCOSE 240* 221* 146*    Electrolytes  Recent Labs Lab 11/22/15 1715 11/23/15 0445 11/23/15 1543  CALCIUM 7.7* 7.8* 7.9*  MG 2.0 2.0 1.9  PHOS 3.9 3.9 4.3    CBC  Recent Labs Lab 11/22/2015 1026 11/22/15 0345 11/23/15 0445  WBC 8.3 6.9 10.5  HGB 14.1 12.2* 12.6*  HCT 43.9 38.1* 38.9*  PLT 152 122* 121*    Coag's No results for input(s): APTT, INR in the last 168 hours.  Sepsis Markers  Recent Labs Lab 11-22-2015 1111  LATICACIDVEN 2.01*    ABG  Recent Labs Lab 2015/11/22 1835 11/22/15 0753 11/24/15 0221  PHART 7.157* 7.378 7.348*  PCO2ART 54.7* 36.5 39.2  PO2ART 299* 73.2* 111*    Liver Enzymes  Recent Labs Lab 2015-11-22 1026 11/22/15 0345 11/22/15 1030 11/23/15 1323  AST 3,426*  --  590* 144*  ALT 2,108*  --  1,191* 765*  ALKPHOS 110  --  84 73  BILITOT 1.1  --  0.8 0.6  ALBUMIN 3.6 2.6* 2.4* 2.4*    Cardiac Enzymes  Recent Labs Lab 11/23/15 1323 11/24/15 0151 11/24/15 0655  TROPONINI 0.26* 0.19* 0.19*    Glucose  Recent Labs Lab 11/23/15 0856 11/23/15 1229 11/23/15 1633 11/23/15 2035 11/23/15 2349 11/24/15 0348  GLUCAP 233* 169* 146* 160* 178* 169*    Imaging Dg Chest Port 1 View  Result Date: 11/24/2015 CLINICAL DATA:  Acute respiratory failure.  Shortness of breath EXAM: PORTABLE CHEST 1 VIEW COMPARISON:  11/23/2015 FINDINGS: Postoperative changes in the mediastinum. Interval removal of endotracheal tube, enteric tube, and central venous catheters. Normal heart size and pulmonary vascularity. Bilateral pleural effusions with basilar  infiltration or atelectasis. Old left rib fractures. Calcified and tortuous aorta. No pneumothorax. Degenerative changes in the shoulders. IMPRESSION: Bilateral pleural effusions with basilar atelectasis or infiltration. Electronically Signed   By: Burman NievesWilliam  Stevens M.D.   On: 11/24/2015 02:24   STUDIES:   CULTURES: bld 9/16 >> Urine 9/16>>>  ANTIBIOTICS: Zosyn 9/16 >>>9/19 vanc 9/16 >>9/18 Ceftriaxone 9/19>>>  SIGNIFICANT EVENTS: [facility], pressors  LINES/TUBES: 9/16 rt ij>>>self 9/18 9/16 ETT>>>9/18  DISCUSSION: Unclear cause of acute respiratory acidosis and liver transaminitis and this 75 year old smoker and alcoholic with COPD. Portable chest x-ray is difficult to interpret we'll treat for left-sided presumed HCAP  ASSESSMENT / PLAN:  PULMONARY A: Acute hypercarbic respiratory failure - extubated 9/18.  COPD LLL PNA P:   improved aeration LLL ABG reviewed. PH and CO2 ok. On bipap 40% fio2 and peep 5. Wean as able.   CARDIOVASCULAR A:  CAD Elevated troponin peaked at 0.59.  Shock likely septic, hypovolemic P:  Aspirin Monitor cvp - dc  RENAL A:   AKI - ? Etio, on ARB , likely ATN - improving  hypovolemia P:   Maintain pos balance CO2 improved with switching fluid, cont 1/2 NS.  Defer further w/up at this time.   GASTROINTESTINAL A:   Transaminitis ? Etio, sepsis?  ETOH, statin Follow LFTs now - coming down with improvement of his overall status Start feeds Acute hep panel - pending   HEMATOLOGIC A:   Mild thrombocytopenia (h/o etoh) P:  Do heparin dvt ppx dose NO lov with arf  INFECTIOUS A:   Severe sepsis, source - HCAP P:   Empiric zosyn. vanc was stopped 9/18. Consider narrowing zosyn to levaquin.  Get sputum if able  ENDOCRINE A:   DM-2   P:   phalopathy tsh p Cortisol ok.   NEUROLOGIC A:   Acute metabolic encephalopathy Dementia MRI 2013- Old left frontal cortical and subcortical  infarction P:   Need fent drip   FAMILY  - Updates: no family at bedside today.   Ccm time 35 min   Bryan Rossettianiel J. Tyson AliasFeinstein, MD, FACP Pgr: [phone] [facility]    11/24/2015, 8:17 AM  STAFF NOTE: Cindi CarbonI, Daniel Feinstein, MD FACP have personally reviewed patient's available data, including medical history, events of note, physical examination and test results as part of my evaluation. I have discussed with resident/NP and other care providers such as pharmacist, RN and RRT. In addition, I personally evaluated patient and elicited key findings of: alert, wants mask off, mild increase WOB, pcxr with atx bases, pos 1.5 liters, amio remains but rate 130, it does not appear as if he needs NIMV and asp risk high- not offer, he is speaking full paragraphs, bolus amio 150 , maintain , add dig mini load, add metop around  clock IV as we await oral access and likley will require further bolus amio loads, no anticoagulation - fall risk seems high, O2 support, narrow abx to ceftriaxone, kvo, may need lasix, chem in am, will call daughter now and discuss code status we have a meeting planned this am, get slp, concern etoh WD vs just delirium in setting dementia, assess qtc add haldol  The patient is critically ill with multiple organ systems failure and requires high complexity decision making for assessment and support, frequent evaluation and titration of therapies, application of advanced monitoring technologies and extensive interpretation of multiple databases.   Critical Care Time devoted to patient care services described in this note is 30 Minutes. This time reflects time of care of this signee: Rory Percy, MD FACP. This critical care time does not reflect procedure time, or teaching time or supervisory time of PA/NP/Med student/Med Resident etc but could involve care discussion time. Rest per NP/medical resident whose note is outlined above and that I agree with   Bryan Rossetti.  Tyson Alias, MD, FACP Pgr: [phone] [place] 11/24/2015 8:53 AM   I have had extensive discussions with family sister. We discussed patients current circumstances and organ failures. We also discussed patient's prior wishes under circumstances such as this. Family has decided to NOT perform resuscitation if arrest but to continue current medical support for now. NO HD, no heroics, NO Preessors

## 2015-11-24 NOTE — Evaluation (Signed)
Clinical/Bedside Swallow Evaluation Patient Details  Name: [name] MRN: [id number] Date of Birth: May 10, 1940  Today's Date: 11/24/2015 Time: SLP Start Time (ACUTE ONLY): 1105 SLP Stop Time (ACUTE ONLY): 1119 SLP Time Calculation (min) (ACUTE ONLY): 14 min  Past Medical History:  Past Medical History:  Diagnosis Date  . CHF (congestive heart failure) (HCC)    S/p CABG   . Chronic kidney disease    Baseline Cr appears to be 1.2-1.4   . COPD (chronic obstructive pulmonary disease) (HCC)   . Coronary artery disease   . CVA (cerebral infarction)    CT head 06/2011 with remote left frontal infarct, remote basal ganglia  lacunar infarcts bilaterally  . Dementia   . Diabetes mellitus   . Hyperlipidemia   . Hypertension   . Myocardial infarction (HCC)   . Occlusion and stenosis of carotid artery without mention of cerebral infarction   . Paroxysmal a-fib (HCC)   . PVD (peripheral vascular disease) (HCC)    S/p bilateral BKA's   . Tobacco abuse    States 2 PPD currently    Past Surgical History:  Past Surgical History:  Procedure Laterality Date  . Coronary artery bypass x3  01/04/2002  . Lt below-the-knee amputation  12/07/2009  . RT below-the-knee amputation  07/31/2008   HPI:  75 year old smoker ETOH abuse, COPD, CVA, dementia, bilateral amputee, CABG, HTN nursing home resident admitted for respiratory distress.Intubated 9/16-9/18. CXR Bilateral pleural effusions with basilar atelectasis or infiltration. No prior ST notes.   Assessment / Plan / Recommendation Clinical Impression  Pt is a 75 yr old with hx of alcohol abuse, COPD, 2 day intubation, atelectasis or infiltrates and present confusion. Couging present at baseline on arrival which continued following thin liquid and puree trials. Full assessment with MBS is recommended to visualize pharyngeal swallow function. Recommend continue NPO with MBS next date.      Aspiration Risk   (mod-severe)    Diet  Recommendation NPO   Medication Administration: Via alternative means    Other  Recommendations Oral Care Recommendations: Oral care QID   Follow up Recommendations  (TBD)      Frequency and Duration            Prognosis        Swallow Study   General HPI: 75 year old smoker ETOH abuse, COPD, CVA, dementia, bilateral amputee, CABG, HTN nursing home resident admitted for respiratory distress.Intubated 9/16-9/18. CXR Bilateral pleural effusions with basilar atelectasis or infiltration. No prior ST notes. Type of Study: Bedside Swallow Evaluation Previous Swallow Assessment: none Diet Prior to this Study: NPO Temperature Spikes Noted: No Respiratory Status: Nasal cannula History of Recent Intubation: Yes Length of Intubations (days):  (2 1/2 days) Date extubated: 11/23/15 Behavior/Cognition: Alert;Confused;Distractible;Requires cueing Oral Cavity Assessment: Dry Oral Care Completed by SLP: Yes Oral Cavity - Dentition: Poor condition;Missing dentition (has 3 upper teeth) Vision: Functional for self-feeding Self-Feeding Abilities: Needs assist;Needs set up Patient Positioning: Upright in bed Baseline Vocal Quality: Hoarse Volitional Cough: Strong Volitional Swallow: Able to elicit    Oral/Motor/Sensory Function Overall Oral Motor/Sensory Function:  (discoordination)   Ice Chips Ice chips: Not tested   Thin Liquid Thin Liquid: Impaired Presentation: Cup Pharyngeal  Phase Impairments: Cough - Delayed;Suspected delayed Swallow    Nectar Thick Nectar Thick Liquid: Not tested   Honey Thick Honey Thick Liquid: Not tested   Puree Puree: Impaired Presentation: Spoon Pharyngeal Phase Impairments: Cough - Delayed   Solid   GO   Solid: Not tested  Royce MacadamiaLitaker, [place] Sweetin Willis 11/24/2015,11:41 AM   Breck CoonsLisa Willis Lonell FaceLitaker M.Ed [employer] Pager [phone]

## 2015-11-24 NOTE — Progress Notes (Signed)
eLink Physician-Brief Progress Note Patient Name: Bryan BunnellDonald G [name] DOB: 11/15/1940 MRN: [id number]   Date of Service  11/24/2015  HPI/Events of Note  Increased WOB on camera check when taken off Bipap  eICU Interventions  Check CXR, ABG Place back on Bipap Continue steroids, nebs     Intervention Category Evaluation Type: Other  Arvo Ealy 11/24/2015, 1:58 AM

## 2015-11-24 NOTE — Progress Notes (Signed)
Pt taken off Bipap and placed on [place] 4 L with humidity per Dr. Gwendolyn GrantFeinstein's request. Pt is tolerating the nasal cannula well.

## 2015-11-24 NOTE — Progress Notes (Signed)
Pt refused neb treatment.

## 2015-11-24 NOTE — Progress Notes (Signed)
PT Cancellation Note  Patient Details Name: Maryfrances BunnellDonald G Koy MRN: [id number] DOB: 09/07/1940   Cancelled Treatment:    Reason Eval/Treat Not Completed: Patient not medically ready (spoke with nsg [facility](paul) will hold due to resp at this time)   Fabio AsaWerner, [place] Gentles J 11/24/2015, 5:37 PM

## 2015-11-25 ENCOUNTER — Inpatient Hospital Stay (HOSPITAL_COMMUNITY): Payer: Medicare Other

## 2015-11-25 DIAGNOSIS — I2510 Atherosclerotic heart disease of native coronary artery without angina pectoris: Secondary | ICD-10-CM

## 2015-11-25 DIAGNOSIS — R7989 Other specified abnormal findings of blood chemistry: Secondary | ICD-10-CM

## 2015-11-25 DIAGNOSIS — J9602 Acute respiratory failure with hypercapnia: Secondary | ICD-10-CM

## 2015-11-25 DIAGNOSIS — E440 Moderate protein-calorie malnutrition: Secondary | ICD-10-CM

## 2015-11-25 DIAGNOSIS — I4819 Other persistent atrial fibrillation: Secondary | ICD-10-CM

## 2015-11-25 DIAGNOSIS — R945 Abnormal results of liver function studies: Secondary | ICD-10-CM

## 2015-11-25 DIAGNOSIS — I481 Persistent atrial fibrillation: Secondary | ICD-10-CM

## 2015-11-25 LAB — GLUCOSE, CAPILLARY
GLUCOSE-CAPILLARY: 120 mg/dL — AB (ref 65–99)
GLUCOSE-CAPILLARY: 115 mg/dL — AB (ref 65–99)
GLUCOSE-CAPILLARY: 117 mg/dL — AB (ref 65–99)
GLUCOSE-CAPILLARY: 124 mg/dL — AB (ref 65–99)
Glucose-Capillary: 147 mg/dL — ABNORMAL HIGH (ref 65–99)

## 2015-11-25 LAB — CULTURE, RESPIRATORY W GRAM STAIN

## 2015-11-25 LAB — BASIC METABOLIC PANEL
Anion gap: 10 (ref 5–15)
BUN: 62 mg/dL — AB (ref 6–20)
CHLORIDE: 109 mmol/L (ref 101–111)
CO2: 21 mmol/L — ABNORMAL LOW (ref 22–32)
CREATININE: 1.45 mg/dL — AB (ref 0.61–1.24)
Calcium: 8.7 mg/dL — ABNORMAL LOW (ref 8.9–10.3)
GFR, EST AFRICAN AMERICAN: 52 mL/min — AB (ref >60)
GFR, EST NON AFRICAN AMERICAN: 45 mL/min — AB (ref >60)
Glucose, Bld: 119 mg/dL — ABNORMAL HIGH (ref 65–99)
POTASSIUM: 4.4 mmol/L (ref 3.5–5.1)
SODIUM: 140 mmol/L (ref 135–145)

## 2015-11-25 LAB — CULTURE, RESPIRATORY: CULTURE: NORMAL

## 2015-11-25 MED ORDER — IPRATROPIUM-ALBUTEROL 0.5-2.5 (3) MG/3ML IN SOLN: 3.0000 mL | Freq: Four times a day (QID) | RESPIRATORY_TRACT | Status: DC

## 2015-11-25 MED ORDER — IPRATROPIUM-ALBUTEROL 0.5-2.5 (3) MG/3ML IN SOLN
3.0000 mL | Freq: Three times a day (TID) | RESPIRATORY_TRACT | Status: DC
  Administered 2015-11-25 – 2015-12-01 (×19): 3 mL via RESPIRATORY_TRACT
  Filled 2015-11-25 (×20): qty 3

## 2015-11-25 MED ORDER — METOPROLOL TARTRATE 25 MG PO TABS
25.0000 mg | ORAL_TABLET | Freq: Three times a day (TID) | ORAL | Status: DC
  Administered 2015-11-25 – 2015-11-28 (×8): 25 mg via ORAL
  Filled 2015-11-25 (×8): qty 1

## 2015-11-25 NOTE — NC FL2 (Signed)
[place] LEVEL OF CARE SCREENING TOOL     IDENTIFICATION  Patient Name: [name] Birthdate: 24-Dec-1940 Sex: male Admission Date (Current Location): 11/26/2015  [facility] [place] and [place] Number:  Herbalist and Address:  [facility] [place] [address] [place], [place] [address]      Provider Number: [id number]  Attending Physician Name and Address:  Jonetta Osgood, MD  Relative Name and Phone Number:       Current Level of Care: Hospital Recommended Level of Care: [place] Prior Approval Number:    Date Approved/Denied:   PASRR Number:  [id number]   Discharge Plan: SNF    Current Diagnoses: Patient Active Problem List   Diagnosis Date Noted  . Malnutrition of moderate degree 11/24/2015  . Dyspnea   . Sepsis ([place])   . Renal failure (ARF), acute on chronic (HCC)   . Endotracheal tube present   . Acute respiratory failure ([place]) 12/02/2015  . Healthcare-associated pneumonia   . Renal failure   . Weakness 06/23/2011  . Confusion 06/23/2011  . Hypotension 06/23/2011  . Hypernatremia 06/23/2011  . Hyperchloremia 06/23/2011  . Prerenal azotemia 06/23/2011  . Macrocytosis without anemia 06/23/2011  . Tobacco abuse 06/23/2011  . 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 PVD 09/18/2007  . CELLULITIS AND ABSCESS OF UNSPECIFIED SITE 08/14/2007  . DM W/COMPLICATION NOS, TYPE II 10/04/2006  . GERD 10/04/2006  . RENAL INSUFFICIENCY, CHRONIC 10/04/2006  . INFECTION NOS, BONE, UNSPECIFIED SITE 10/04/2006  . STATUS, ARTHRODESIS 10/04/2006  . PSTPRC STATUS, AORTOCORONARY BYPASS 10/04/2006    Orientation RESPIRATION BLADDER Height & Weight     Self  O2 (4L) Continent Weight: 146 lb 2.6 oz (66.3 kg) Height:  4' (121.9 cm)  BEHAVIORAL SYMPTOMS/MOOD NEUROLOGICAL BOWEL NUTRITION STATUS      Incontinent Diet (Dysphagia 1 Thin Liquids)  AMBULATORY STATUS COMMUNICATION OF NEEDS Skin    Extensive Assist Verbally Normal                       Personal Care Assistance Level of Assistance  Bathing, Feeding, Dressing Bathing Assistance: Maximum assistance Feeding assistance: Limited assistance Dressing Assistance: Maximum assistance     Functional Limitations Info  Sight, Hearing, Speech Sight Info: Adequate Hearing Info: Adequate Speech Info: Adequate    SPECIAL CARE FACTORS FREQUENCY  PT (By licensed PT), OT (By licensed OT), Speech therapy     PT Frequency: 3 OT Frequency: 3     Speech Therapy Frequency: 3      Contractures Contractures Info: Not present    Additional Factors Info  Code Status, Allergies, Isolation Precautions Code Status Info: DNR Allergies Info: Ibuprofen, Iohexol, Wellbutrin Bupropion Hcl     Isolation Precautions Info: MRSA     Current Medications (11/25/2015):  This is the current hospital active medication list Current Facility-Administered Medications  Medication Dose Route Frequency Provider Last Rate Last Dose  . 0.9 %  sodium chloride infusion  250 mL Intravenous PRN Rigoberto Noel, MD      . acetaminophen (TYLENOL) suppository 650 mg  650 mg Rectal Q6H PRN Jose Shirl Harris, MD   650 mg at 11/23/15 2201  . acetaminophen (TYLENOL) tablet 650 mg  650 mg Oral Q4H PRN Rigoberto Noel, MD      . albuterol (PROVENTIL) (2.5 MG/3ML) 0.083% nebulizer solution 5 mg  5 mg Nebulization Once Margette Fast, MD      .  amiodarone (NEXTERONE PREMIX) 360-4.14 MG/200ML-% (1.8 mg/mL) IV infusion  30 mg/hr Intravenous Continuous Raylene Miyamoto, MD 16.7 mL/hr at 11/25/15 1452 30 mg/hr at 11/25/15 1452  . budesonide (PULMICORT) nebulizer solution 0.5 mg  0.5 mg Nebulization BID Jose Shirl Harris, MD   0.5 mg at 11/25/15 0910  . cefTRIAXone (ROCEPHIN) 1 g in dextrose 5 % 50 mL IVPB  1 g Intravenous Q24H Tasrif Ahmed, MD   1 g at 11/25/15 1130  . chlorhexidine gluconate (MEDLINE KIT) (PERIDEX) 0.12 % solution 15 mL  15 mL Mouth Rinse  BID Tammy S Parrett, NP   15 mL at 11/25/15 0739  . digoxin (LANOXIN) 0.25 MG/ML injection 0.5 mg  0.5 mg Intravenous Daily Tasrif Ahmed, MD   0.5 mg at 11/25/15 0931  . famotidine (PEPCID) IVPB 20 mg premix  20 mg Intravenous Q24H Valeda Malm Rumbarger, RPH   20 mg at 11/24/15 2105  . fentaNYL (SUBLIMAZE) injection 50 mcg  50 mcg Intravenous Q2H PRN Rigoberto Noel, MD   50 mcg at 11/22/15 0412  . fentaNYL (SUBLIMAZE) injection 50 mcg  50 mcg Intravenous Once Raylene Miyamoto, MD      . haloperidol lactate (HALDOL) injection 1 mg  1 mg Intravenous Q4H PRN Tasrif Ahmed, MD   1 mg at 11/24/15 1851  . heparin injection 5,000 Units  5,000 Units Subcutaneous Q8H Rigoberto Noel, MD   5,000 Units at 11/25/15 1543  . insulin aspart (novoLOG) injection 0-9 Units  0-9 Units Subcutaneous Q4H Javier Glazier, MD   1 Units at 11/24/15 1250  . insulin glargine (LANTUS) injection 8 Units  8 Units Subcutaneous Daily Tasrif Ahmed, MD   8 Units at 11/25/15 0929  . ipratropium-albuterol (DUONEB) 0.5-2.5 (3) MG/3ML nebulizer solution 3 mL  3 mL Nebulization TID Raylene Miyamoto, MD      . MEDLINE mouth rinse  15 mL Mouth Rinse QID Tammy S Parrett, NP   15 mL at 11/25/15 1304  . methylPREDNISolone sodium succinate (SOLU-MEDROL) 40 mg/mL injection 40 mg  40 mg Intravenous Q6H Jose Shirl Harris, MD   40 mg at 11/25/15 1543  . metoprolol (LOPRESSOR) injection 5 mg  5 mg Intravenous Q6H Tasrif Ahmed, MD   5 mg at 11/25/15 1303  . norepinephrine (LEVOPHED) 4 mg in dextrose 5 % 250 mL (0.016 mg/mL) infusion  0-40 mcg/min Intravenous Titrated Raylene Miyamoto, MD   Stopped at 11/23/15 0900  . pantoprazole sodium (PROTONIX) 40 mg/20 mL oral suspension 40 mg  40 mg Per Tube Daily Melvenia Needles, NP   Stopped at 11/23/15 0927  . sodium chloride 0.9 % bolus 500 mL  500 mL Intravenous Once Margette Fast, MD      . sodium chloride 0.9 % bolus 500 mL  500 mL Intravenous Once Raylene Miyamoto, MD         Discharge  Medications: Please see discharge summary for a list of discharge medications.  Relevant Imaging Results:  Relevant Lab Results:   Additional Information SSN [id number]    Barbette Or, [place]

## 2015-11-25 NOTE — Consult Note (Addendum)
CARDIOLOGY CONSULT NOTE   Patient ID: [name] MRN: [id number] DOB/AGE: 1941-02-14 75 y.o.  Admit date: 11/19/2015  Requesting Physician: Primary Physician:   Horatio Pel, MD Primary Cardiologist: Dr. Claiborne Billings / Dr. Gwenlyn Found (PV)- seen by both remotely  Reason for Consultation:   afib with RVR.   HPI: [name] is a 75 y.o. male with a history of COPD (ongoing smoker), CAD s/p CABG (2003) with ischemic cardiomyopathy and CHF, ongoing alcohol use, PAF previously on coumadin, PAD status post bilateral BKA's, dementia and what appears to be baseline chronic kidney disease (baseline creat~ 1.4) who was brought into ER from SNF on 11/26/2015 with increasing shortness of breath and productive cough x 7 days. Patient with acute hypercarbic respiratory failure reuqiring intubation and pressors. Now in afib with RVR and cardiology consulted.   He underwent cardiac catheterization January 01, 2002, revealing three-vessel disease with an anomalous circumflex.  He underwent coronary artery bypass grafting by Cyndia Bent, M.D., January 04, 2002, with a LIMA to his LAD, vein graft to intermediate and to the circumflex marginal branch. He was catheterized July 2004 revealing patent grafts with an EF of 40%. He has has had numerous PV angiograms and ultimately bilateral BKAs. Last echo in 2012 showed EF 30-35% with mod-sev inferior wall HK.He was followed by [facility] but lost to follow up for many years. He continues to smoke and drink heavily. He lives at a nursing facility where he is prescribed a beer a day.   He presented from SNF on 11/06/2015. ED evaluation showed acute respiratory acidosis on VBG and he was placed on BiPAP, he was given empiric antibiotics for left-sided infiltrate on portable fil m which have reviewed. Labs showed evidence of acute renal failure and transaminitis, mild lactic acidosis and slight elevated troponin. EKG showed nonspecific T-wave abnormalities in  anterolateral leads. He required intubation and pressors. He was extubated on 11/23/15. Elevated troponin peaked at 0.59. Creat peaked at 5.13 but now improved to 1.45.   He went into afib with RVR on 11/20/15 and cardiology consulted. When I went to see patient he was agitated and would not give me clear answers. Told me to "please leave me alone." Wouldn't answer any other questions. Appeared comfortable.   Past Medical History:  Diagnosis Date  . CHF (congestive heart failure) (HCC)    S/p CABG   . Chronic kidney disease    Baseline Cr appears to be 1.2-1.4   . COPD (chronic obstructive pulmonary disease) ([place])   . Coronary artery disease   . CVA (cerebral infarction)    CT head 06/2011 with remote left frontal infarct, remote basal ganglia  lacunar infarcts bilaterally  . Dementia   . Diabetes mellitus   . Hyperlipidemia   . Hypertension   . Myocardial infarction ([place])   . Occlusion and stenosis of carotid artery without mention of cerebral infarction   . Paroxysmal a-fib ([place])   . PVD (peripheral vascular disease) ([place])    S/p bilateral BKA's   . Tobacco abuse    States 2 PPD currently      Past Surgical History:  Procedure Laterality Date  . Coronary artery bypass x3  01/04/2002  . Lt below-the-knee amputation  12/07/2009  . RT below-the-knee amputation  07/31/2008    Allergies  Allergen Reactions  . Ibuprofen Other (See Comments)    Can't take because of KIDNEY function  . Iohexol      Desc: PT IS NOT ALLERGIC TO  CONTRAST- PT HAS BEEN TOLD BY DR FOX TO NOT BE GIVEN CONTRAST DUE TO RENAL INSUFF.  STEPHANIE DAVIS,RT-RCT., Onset Date: [id number]   . Wellbutrin [Bupropion Hcl]     I have reviewed the patient's current medications . albuterol  5 mg Nebulization Once  . budesonide (PULMICORT) nebulizer solution  0.5 mg Nebulization BID  . cefTRIAXone (ROCEPHIN)  IV  1 g Intravenous Q24H  . chlorhexidine gluconate (MEDLINE KIT)  15 mL Mouth Rinse BID  . digoxin  0.5 mg  Intravenous Daily  . famotidine (PEPCID) IV  20 mg Intravenous Q24H  . fentaNYL (SUBLIMAZE) injection  50 mcg Intravenous Once  . heparin  5,000 Units Subcutaneous Q8H  . insulin aspart  0-9 Units Subcutaneous Q4H  . insulin glargine  8 Units Subcutaneous Daily  . ipratropium-albuterol  3 mL Nebulization TID  . mouth rinse  15 mL Mouth Rinse QID  . methylPREDNISolone (SOLU-MEDROL) injection  40 mg Intravenous Q6H  . metoprolol  5 mg Intravenous Q6H  . pantoprazole sodium  40 mg Per Tube Daily  . sodium chloride  500 mL Intravenous Once  . sodium chloride  500 mL Intravenous Once   . amiodarone 30 mg/hr (11/25/15 1000)  . norepinephrine (LEVOPHED) Adult infusion Stopped (11/23/15 0900)   sodium chloride, acetaminophen, acetaminophen, fentaNYL (SUBLIMAZE) injection, haloperidol lactate  Prior to Admission medications   Medication Sig Start Date End Date Taking? Authorizing Provider  acetaminophen (TYLENOL) 500 MG tablet Take 500 mg by mouth every 4 (four) hours as needed for moderate pain, fever or headache.   Yes Historical Provider, MD  alum & mag hydroxide-simeth (MINTOX) 200-200-20 MG/5ML suspension Take 30 mLs by mouth every 6 (six) hours as needed for indigestion or heartburn.   Yes Historical Provider, MD  amLODipine (NORVASC) 5 MG tablet Take 5 mg by mouth daily.   Yes Historical Provider, MD  atorvastatin (LIPITOR) 20 MG tablet Take 20 mg by mouth every evening.   Yes Historical Provider, MD  budesonide-formoterol (SYMBICORT) 160-4.5 MCG/ACT inhaler Inhale 2 puffs into the lungs 2 (two) times daily.   Yes Historical Provider, MD  donepezil (ARICEPT) 10 MG tablet Take 10 mg by mouth at bedtime.   Yes Historical Provider, MD  gabapentin (NEURONTIN) 300 MG capsule Take 300 mg by mouth 2 (two) times daily.    Yes Historical Provider, MD  guaifenesin (HUMIBID E) 400 MG TABS Take 400 mg by mouth 2 (two) times daily as needed (congestion). For congestion.    Yes Historical Provider, MD    guaiFENesin (ROBITUSSIN) 100 MG/5ML liquid Take 200 mg by mouth every 6 (six) hours as needed for cough.   Yes Historical Provider, MD  hydrOXYzine (VISTARIL) 25 MG capsule Take 25 mg by mouth at bedtime.   Yes Historical Provider, MD  ipratropium-albuterol (DUONEB) 0.5-2.5 (3) MG/3ML SOLN Take 3 mLs by nebulization 4 (four) times daily.   Yes Historical Provider, MD  loperamide (IMODIUM) 2 MG capsule Take 2 mg by mouth as needed for diarrhea or loose stools.   Yes Historical Provider, MD  Lutein 20 MG CAPS Take 20 mg by mouth daily.   Yes Historical Provider, MD  magnesium hydroxide (MILK OF MAGNESIA) 400 MG/5ML suspension Take 30 mLs by mouth at bedtime as needed for mild constipation.   Yes Historical Provider, MD  mirtazapine (REMERON) 30 MG tablet Take 15 mg by mouth at bedtime.   Yes Historical Provider, MD  Neomycin-Bacitracin-Polymyxin (TRIPLE ANTIBIOTIC) [phone] OINT Apply 1 application topically daily as needed (wound  care).   Yes Historical Provider, MD  olmesartan (BENICAR) 20 MG tablet Take 20 mg by mouth daily.   Yes Historical Provider, MD  Omega-3 Fatty Acids (FISH OIL) 1000 MG CAPS Take 1,000 mg by mouth daily.   Yes Historical Provider, MD  pantoprazole (PROTONIX) 40 MG tablet Take 40 mg by mouth 2 (two) times daily.   Yes Historical Provider, MD     Social History   Social History  . Marital status: Divorced    Spouse name: N/A  . Number of children: N/A  . Years of education: N/A   Occupational History  . Not on file.   Social History Main Topics  . Smoking status: Current Every Day Smoker    Packs/day: 2.00    Years: 55.00    Types: Cigarettes  . Smokeless tobacco: Never Used  . Alcohol use No  . Drug use: No  . Sexual activity: Not on file   Other Topics Concern  . Not on file   Social History Narrative   Lives at home alone on [address], but son lives two doors down and helps with daily needs. He gets around in a wheelchair. He currently  endorses 2 PPD smoking. He is widowed and has a sone and a daughter.     Family Status  Relation Status  . Mother    Family History  Problem Relation Age of Onset  . Stroke Mother        ROS:  Full 14 point review of systems complete and found to be negative unless listed above.  Physical Exam: Blood pressure (!) 149/66, pulse (!) 47, temperature 97.7 F (36.5 C), temperature source Axillary, resp. rate (!) 21, height 4' (1.219 m), weight 146 lb 2.6 oz (66.3 kg), SpO2 99 %.  General: Well developed, well nourished, male in no acute distress. Disheveled, chronically ill appearing  Head: Eyes PERRLA, No xanthomas.   Normocephalic and atraumatic, oropharynx without edema or exudate.   Lungs: ctab Heart: HRRR S1 S2, no rub/gallop, Heart regular rate and rhythm with S1, S2  murmur. pulses are 2+ extrem.   Neck: No carotid bruits. No lymphadenopathy. No JVD. Abdomen: Bowel sounds present, abdomen soft and non-tender without masses or hernias noted. Msk:  No spine or cva tenderness. No weakness, no joint deformities or effusions. Extremities: No clubbing or cyanosis.  No LE edema. Bilateral BKAs Neuro: Alert and oriented X 3. No focal deficits noted. Psych:  Good affect, responds appropriately Skin: No rashes or lesions noted.  Labs:   Lab Results  Component Value Date   WBC 10.5 11/23/2015   HGB 12.6 (L) 11/23/2015   HCT 38.9 (L) 11/23/2015   MCV 97.7 11/23/2015   PLT 121 (L) 11/23/2015   No results for input(s): INR in the last 72 hours.  Recent Labs Lab 11/23/15 1323  11/25/15 0316  NA  --   < > 140  K  --   < > 4.4  CL  --   < > 109  CO2  --   < > 21*  BUN  --   < > 62*  CREATININE  --   < > 1.45*  CALCIUM  --   < > 8.7*  PROT 5.1*  --   --   BILITOT 0.6  --   --   ALKPHOS 73  --   --   ALT 765*  --   --   AST 144*  --   --  GLUCOSE  --   < > 119*  ALBUMIN 2.4*  --   --   < > = values in this interval not displayed. Magnesium  Date Value Ref Range Status    11/23/2015 1.9 1.7 - 2.4 mg/dL Final    Recent Labs  11/23/15 1323 11/24/15 0151 11/24/15 0655 11/24/15 1331  TROPONINI 0.26* 0.19* 0.19* 0.15*   No results for input(s): TROPIPOC in the last 72 hours. Pro B Natriuretic peptide (BNP)  Date/Time Value Ref Range Status  09/14/2010 10:37 PM 1250.0 (H) 0 - 125 pg/mL Final  12/07/2009 04:33 AM 619.0 (H) 0.0 - 100.0 pg/mL Final    No results found for: DDIMER Lipase  Date/Time Value Ref Range Status  11/09/2015 10:30 AM 37 11 - 51 U/L Final   TSH  Date/Time Value Ref Range Status  11/24/2015 01:40 AM 0.457 0.350 - 4.500 uIU/mL Final  09/15/2010 08:55 AM 0.704 0.350 - 4.500 uIU/mL Final     Echo: pending  ECG:  afib with RVR  Radiology:  Dg Chest Port 1 View  Result Date: 11/24/2015 CLINICAL DATA:  Acute respiratory failure.  Shortness of breath EXAM: PORTABLE CHEST 1 VIEW COMPARISON:  11/23/2015 FINDINGS: Postoperative changes in the mediastinum. Interval removal of endotracheal tube, enteric tube, and central venous catheters. Normal heart size and pulmonary vascularity. Bilateral pleural effusions with basilar infiltration or atelectasis. Old left rib fractures. Calcified and tortuous aorta. No pneumothorax. Degenerative changes in the shoulders. IMPRESSION: Bilateral pleural effusions with basilar atelectasis or infiltration. Electronically Signed   By: Lucienne Capers M.D.   On: 11/24/2015 02:24   Dg Swallowing Func-speech Pathology  Result Date: 11/25/2015 Objective Swallowing Evaluation: Type of Study: MBS-Modified Barium Swallow Study Patient Details Name: [name] MRN: [id number] Date of Birth: 10/09/1940 Today's Date: 11/25/2015 Time: SLP Start Time (ACUTE ONLY): 1200-SLP Stop Time (ACUTE ONLY): 1218 SLP Time Calculation (min) (ACUTE ONLY): 18 min Past Medical History: Past Medical History: Diagnosis Date . CHF (congestive heart failure) (HCC)   S/p CABG  . Chronic kidney disease   Baseline Cr appears to be  1.2-1.4  . COPD (chronic obstructive pulmonary disease) ([place])  . Coronary artery disease  . CVA (cerebral infarction)   CT head 06/2011 with remote left frontal infarct, remote basal ganglia  lacunar infarcts bilaterally . Dementia  . Diabetes mellitus  . Hyperlipidemia  . Hypertension  . Myocardial infarction ([place])  . Occlusion and stenosis of carotid artery without mention of cerebral infarction  . Paroxysmal a-fib ([place])  . PVD (peripheral vascular disease) ([place])   S/p bilateral BKA's  . Tobacco abuse   States 2 PPD currently  Past Surgical History: Past Surgical History: Procedure Laterality Date . Coronary artery bypass x3  01/04/2002 . Lt below-the-knee amputation  12/07/2009 . RT below-the-knee amputation  07/31/2008 HPI: 75 year old smoker ETOH abuse, COPD, CVA, dementia, bilateral amputee, CABG, HTN nursing home resident admitted for respiratory distress.Intubated 9/16-9/18. CXR Bilateral pleural effusions with basilar atelectasis or infiltration. No prior ST notes. No Data Recorded Assessment / Plan / Recommendation CHL IP CLINICAL IMPRESSIONS 11/25/2015 Therapy Diagnosis Mild pharyngeal phase dysphagia;Mild oral phase dysphagia;Moderate oral phase dysphagia Clinical Impression Mild-mod oral and mild pharyngeal dysphagia due to lingual weakness/discoordination resulting in decreased manipulation and delayed propulsion due to holding boluses. Pharyngeal deficits due to sensory impairment leading to reduced tongue base retraction and decreased laryngeal elevation leaving mild vallecular and pyriform sinus residue. Verbal cues for second swallow reduced amount. Pt coughed prior to barium consumption and  frequently throughout study without encroachment of po's into laryngeal vestibule. Increasing aspiration risk is pt's current confused state. Recommend Dys 1 texture and thin liquids, no straws, small sips, swallow twice, FULL supervision. ST will follow.   Impact on safety and function Moderate aspiration risk    CHL IP TREATMENT RECOMMENDATION 11/25/2015 Treatment Recommendations Therapy as outlined in treatment plan below   Prognosis 11/25/2015 Prognosis for Safe Diet Advancement (No Data) Barriers to Reach Goals Cognitive deficits Barriers/Prognosis Comment -- CHL IP DIET RECOMMENDATION 11/25/2015 SLP Diet Recommendations Dysphagia 1 (Puree) solids;Thin liquid Liquid Administration via Cup;No straw Medication Administration Crushed with puree Compensations Minimize environmental distractions;Slow rate;Small sips/bites;Multiple dry swallows after each bite/sip Postural Changes Seated upright at 90 degrees   CHL IP OTHER RECOMMENDATIONS 11/25/2015 Recommended Consults -- Oral Care Recommendations Oral care BID Other Recommendations --   CHL IP FOLLOW UP RECOMMENDATIONS 11/25/2015 Follow up Recommendations (No Data)   CHL IP FREQUENCY AND DURATION 11/25/2015 Speech Therapy Frequency (ACUTE ONLY) min 2x/week Treatment Duration 2 weeks      CHL IP ORAL PHASE 11/25/2015 Oral Phase Impaired Oral - Pudding Teaspoon -- Oral - Pudding Cup -- Oral - Honey Teaspoon -- Oral - Honey Cup -- Oral - Nectar Teaspoon Delayed oral transit Oral - Nectar Cup Delayed oral transit Oral - Nectar Straw -- Oral - Thin Teaspoon -- Oral - Thin Cup Delayed oral transit Oral - Thin Straw WFL Oral - Puree -- Oral - Mech Soft -- Oral - Regular Delayed oral transit;Weak lingual manipulation Oral - Multi-Consistency -- Oral - Pill -- Oral Phase - Comment --  CHL IP PHARYNGEAL PHASE 11/25/2015 Pharyngeal Phase Impaired Pharyngeal- Pudding Teaspoon -- Pharyngeal -- Pharyngeal- Pudding Cup -- Pharyngeal -- Pharyngeal- Honey Teaspoon -- Pharyngeal -- Pharyngeal- Honey Cup -- Pharyngeal -- Pharyngeal- Nectar Teaspoon WFL Pharyngeal -- Pharyngeal- Nectar Cup Pharyngeal residue - valleculae;Reduced tongue base retraction Pharyngeal -- Pharyngeal- Nectar Straw -- Pharyngeal -- Pharyngeal- Thin Teaspoon -- Pharyngeal -- Pharyngeal- Thin Cup Pharyngeal residue -  valleculae;Pharyngeal residue - pyriform;Reduced laryngeal elevation;Reduced tongue base retraction Pharyngeal -- Pharyngeal- Thin Straw Pharyngeal residue - valleculae;Pharyngeal residue - pyriform;Reduced tongue base retraction;Reduced laryngeal elevation Pharyngeal -- Pharyngeal- Puree NT Pharyngeal -- Pharyngeal- Mechanical Soft -- Pharyngeal -- Pharyngeal- Regular Pharyngeal residue - valleculae;Reduced tongue base retraction Pharyngeal -- Pharyngeal- Multi-consistency -- Pharyngeal -- Pharyngeal- Pill -- Pharyngeal -- Pharyngeal Comment --  CHL IP CERVICAL ESOPHAGEAL PHASE 11/25/2015 Cervical Esophageal Phase WFL Pudding Teaspoon -- Pudding Cup -- Honey Teaspoon -- Honey Cup -- Nectar Teaspoon -- Nectar Cup -- Nectar Straw -- Thin Teaspoon -- Thin Cup -- Thin Straw -- Puree -- Mechanical Soft -- Regular -- Multi-consistency -- Pill -- Cervical Esophageal Comment -- No flowsheet data found. Houston Siren 11/25/2015, 2:23 PM Orbie Pyo Colvin Caroli.Ed CCC-SLP Pager [phone]               ASSESSMENT AND PLAN:    Active Problems:   Acute respiratory failure (HCC)   Healthcare-associated pneumonia   Renal failure   Sepsis ([place])   Renal failure (ARF), acute on chronic (HCC)   Endotracheal tube present   Dyspnea   Malnutrition of moderate degree  PAF/Afib with RVR: atrial fibrillation not new. He was remotely treated with coumadin. CHADSVASC score of at least (CHF, HTN, age, DM, CAD). Not a candidate for oral anticoagulation due to ongoing alcohol abuse, dementia and non compliance. He came in in sinus rhythm and converted to afib on Monday 11/23/15. He had rapid ventricular rates  but he is now well rate controlled on IV amiodarone, IV digoxin and IV lopressor. He cannot take POs currently due to dysphagia. Now about to start a diet and if he tolerates that well we can maybe change him to PO cardiac meds.  Elevated troponin: troponin peaked at 0.59--> 0.26--> 0.19--> 0.15. Likely demand ischemia in  the setting of known CAD, acute respiratory failure and AKI. He has not complained of any chest pain. Not a great candidate for further ischemic with dementia, alcoholism and non compliance. Would restart ASA when taking PO meds   HTN: BP has been mildly elevated today. Would add more antihypertensives when taking PO  AKI: creat improved from 5.13--> 1.45  Carotid artery stenosis: continue asa and statin when able to take PO  HLD: resume statin when taking POs  AAA: distal, measured 3 cm x 3 xcm in 2014  Signed: Angelena Form, PA-C 11/25/2015 2:32 PM  Pager [phone]  Co-Sign MD  Patient seen and examined. Agree with assessment and plan.  Mr. Ackers is a 75 year old Caucasian male who has a history of COPD with long-standing tobacco and alcohol use, CAD and is status post CBG revascularization surgery in 2003.  Moderately severe LV dysfunction.  Previous echo in 2012 showed an ejection fraction of 30-35% with moderately severe inferior wall hypokinesis.  Patient now resides in a nursing home and not been seen in the office setting for several years.  He presented with acute respiratory acidosis on VBG  was placed on BiPAP.  He was treated with them.  He ahs been treated with empiric antibiotics  for left-sided infiltrate.  He required intubation and inotropic pressor support and ultimately was extubated on 11/23/2015. Acute kidney injury with creatinine increasing to 5.13 with subsequent improvement to 1.45.  He developed atrial fibrillation with rapid ventricular response.  He has been treated with IV amiodarone with improvement in ventricular rate, digoxin, and IV metoprolol at 5 mg every 6 hours.  Telemetry now shows ventricular rate in the 70s to 80s with occasional premature ventricular contractions and transient bigeminy.  He is now eatin I will transition IV metoprolol to oral metoprolol tartrate at 25 mg every 8 hrs.  He has had mildly positive troponin, which have trended  downward and most likely is consistent with demand ischemia rather acute myocardial infarction.  He denies any chest pain or anginal symptomatology.  An echo Doppler study was ordered to reassess LV function and has not yet been done.  I would repeat liver function studies with his initial significant transaminitis, which may be contributed by EtOH.  As long his LFTs remain elevated, I would not initiate statin therapy for hyperlipidemia.  Recommend follow-up ECG in a.m.   Troy Sine, MD, [facility] 11/25/2015 6:47 PM

## 2015-11-25 NOTE — Progress Notes (Signed)
MBSS complete. Full report located under chart review in imaging section.   Recommend: Dys 1, thin, no straws, crushed meds, will need full supervision due to confusion.  Pt coughed throughout study without penetration or aspiration.   Breck CoonsLisa Willis DundeeLitaker M.Ed [employer]-SLP Pager [phone]

## 2015-11-25 NOTE — Progress Notes (Signed)
PROGRESS NOTE        PATIENT DETAILS Name: [name] Age: 75 y.o. Sex: male Date of Birth: 10/27/1940 Admit Date: 11/09/2015 Admitting Physician Rigoberto Noel, MD GTX:MIWOE,HOZYYQ DAVIDSON, MD  Brief Narrative: Patient is a 75 y.o. male resident of a skilled nursing facility, history of COPD, CAD status post CABG (2003), PAD status post bilateral BKA, dementia, paroxysmal atrial fibrillation no longer on anticoagulation admitted to the intensive care unit on 9/16 with acute hypercarbic respiratory failure likely from acute exacerbation of COPD. Initially started on BiPAP, however required intubation post admission. He was managed with empiric antibiotics and other supportive measures, and subsequently extubated on 9/18. Hospital course has also been complicated by development of atrial fibrillation with RVR requiring IV digoxin and IV amiodarone infusion, transaminitis and acute renal failure. [facility] on [facility]/20. See below for further details  Subjective: Pleasantly confused-lying comfortably in bed.  Assessment/Plan: Active Problems: Acute hypercarbic respiratory failure: Improved, extubated on 9/18. Secondary to COPD exacerbation. Taper Solu-Medrol, continue bronchodilators.  Severe sepsis secondary to Healthcare associated pneumonia: Improving-sepsis pathophysiology has resolved, patient is now afebrile and without any leukocytosis. . Initially on broad-spectrum antibiotics, now narrowed down to just ceftriaxone. Blood cultures negative so far.  Acute metabolic encephalopathy: Probably secondary to sepsis and hypercarbia, improved-suspect current mental status likely reflective of dementia.  Atrial fibrillation with RVR: Rate controlled with IV amiodarone, IV digoxin and IV Lopressor. Have consulted cardiology for further assistance. Very poor long-term candidate for anticoagulation given alcohol use,  dementia and fall risk.  Mildly elevated troponin: Trend is flat and not consistent with ACS. Suspect more secondary to demand ischemia. Resume aspirin when able to tolerate oral intake.  Acute on chronic renal disease stage III: Acute renal failure secondary secondary to ATN. Creatinine improving with supportive care. Nephrology has signed off. Will attempt to discontinue Foley catheter today. Recommendations are not to restart ACEs/ARB on discharge  Dysphagia: Nothing by mouth-awaiting speech therapy follow-up with modified barium swallow before resuming diet. Will need to discuss with family regarding risk of aspiration in the next few days.  Transaminitis:? Etiology-not sure if this is secondary to sepsis or from EtOH/statin use. Acute hepatitis serology negative. Trend LFTs. Avoid hepatotoxic medications.  Thrombocytopenia: Mild, suspect secondary to sepsis, follow CBC   Type 2 diabetes: CBGs elevated-as patient on steroids-continue 8 units of Lantus and SSI.  History of CAD status post CABG: Resume aspirin and statins when able. Statins on hold due to transaminitis.  History of PAD status post bilateral BKA  AAA: distal, measured 3 cm x 3 xcm in 2014  Dementia with mild delirium: Resume Aricept when oral intake more stable. Continue as needed Haldol.  GERD: Continue PPI  Malnutrition of moderate degree  DVT Prophylaxis: Prophylactic  Heparin   Code Status: DNR  Family Communication: None at bedside-we'll recheck out to family over the next few days  Disposition Plan: Remain inpatient-remain in SDU-back to SNF on discharge  Antimicrobial agents: See below  Procedures: 9/16 rt ij>>>self 9/18 9/16 ETT>>>9/18  CONSULTS:  cardiology and pulmonary/intensive care  Time spent: 25 minutes-Greater than 50% of this time was spent in counseling, explanation of diagnosis, planning of further management, and coordination of care.  MEDICATIONS: Anti-infectives    Start      Dose/Rate Route Frequency Ordered Stop  11/23/15 1030  cefTRIAXone (ROCEPHIN) 1 g in dextrose 5 % 50 mL IVPB     1 g 100 mL/hr over 30 Minutes Intravenous Every 24 hours 11/23/15 0926     11/23/15 0900  piperacillin-tazobactam (ZOSYN) IVPB 3.375 g  Status:  Discontinued     3.375 g 12.5 mL/hr over 240 Minutes Intravenous Every 8 hours 11/23/15 0802 11/23/15 0926   11/23/15 0900  vancomycin (VANCOCIN) IVPB 750 mg/150 ml premix  Status:  Discontinued     750 mg 150 mL/hr over 60 Minutes Intravenous Every 24 hours 11/23/15 0804 11/23/15 0921   11/23/15 0800  vancomycin (VANCOCIN) IVPB 1000 mg/200 mL premix  Status:  Discontinued     1,000 mg 200 mL/hr over 60 Minutes Intravenous Every 48 hours 11/22/2015 1621 11/23/15 0804   11/22/15 1400  ceFEPIme (MAXIPIME) 1 g in dextrose 5 % 50 mL IVPB  Status:  Discontinued     1 g 100 mL/hr over 30 Minutes Intravenous Every 24 hours 11/10/2015 1621 11/23/15 0800   11/17/2015 1115  ceFEPIme (MAXIPIME) 2 g in dextrose 5 % 50 mL IVPB     2 g 100 mL/hr over 30 Minutes Intravenous  Once 11/09/2015 1103 11/30/2015 1152   11/30/2015 1115  vancomycin (VANCOCIN) IVPB 1000 mg/200 mL premix     1,000 mg 200 mL/hr over 60 Minutes Intravenous  Once 11/30/2015 1103 11/09/2015 1257      Scheduled Meds: . albuterol  5 mg Nebulization Once  . budesonide (PULMICORT) nebulizer solution  0.5 mg Nebulization BID  . cefTRIAXone (ROCEPHIN)  IV  1 g Intravenous Q24H  . chlorhexidine gluconate (MEDLINE KIT)  15 mL Mouth Rinse BID  . digoxin  0.5 mg Intravenous Daily  . famotidine (PEPCID) IV  20 mg Intravenous Q24H  . fentaNYL (SUBLIMAZE) injection  50 mcg Intravenous Once  . heparin  5,000 Units Subcutaneous Q8H  . insulin aspart  0-9 Units Subcutaneous Q4H  . insulin glargine  8 Units Subcutaneous Daily  . ipratropium-albuterol  3 mL Nebulization TID  . mouth rinse  15 mL Mouth Rinse QID  . methylPREDNISolone (SOLU-MEDROL) injection  40 mg Intravenous Q6H  . metoprolol  5 mg  Intravenous Q6H  . pantoprazole sodium  40 mg Per Tube Daily  . sodium chloride  500 mL Intravenous Once  . sodium chloride  500 mL Intravenous Once   Continuous Infusions: . amiodarone 30 mg/hr (11/25/15 1452)  . norepinephrine (LEVOPHED) Adult infusion Stopped (11/23/15 0900)   PRN Meds:.sodium chloride, acetaminophen, acetaminophen, fentaNYL (SUBLIMAZE) injection, haloperidol lactate   PHYSICAL EXAM: Vital signs: Vitals:   11/25/15 1315 11/25/15 1400 11/25/15 1500 11/25/15 1600  BP: (!) 149/66 (!) 160/82 (!) 167/71 (!) 163/83  Pulse: (!) 47 (!) 45 68 73  Resp: (!) 21 (!) _0 Temp:      TempSrc:      SpO2: 99% 99% 96% 95%  Weight:      Height:       Filed Weights   11/23/15 0113 11/24/15 0358 11/25/15 0110  Weight: 63.3 kg (139 lb 8.8 oz) 66.5 kg (146 lb 9.7 oz) 66.3 kg (146 lb 2.6 oz)   Body mass index is 44.6 kg/m.   General appearance :Awake,Pleasantly confused but not in any distress.  Eyes:, pupils equally reactive to light and accomodation,no scleral icterus.Pink conjunctiva HEENT: Atraumatic and Normocephalic Neck: supple, no JVD. No cervical lymphadenopathy. No thyromegaly Resp:Good air entry bilaterally, rhonchi scattered all over CVS: S1 S2 irregular, no murmurs.  GI: Bowel sounds present, Non tender and not distended with no gaurding, rigidity or rebound.No organomegaly Extremities: B/L BKA-stump intact without any ulceration Neurology:  Moving all 4 extremities  Psychiatric: Pleasant and confused. Musculoskeletal:No digital cyanosis in upper extremities  I have personally reviewed following labs and imaging studies  LABORATORY DATA: CBC:  Recent Labs Lab 11/10/2015 1026 11/22/15 0345 11/23/15 0445  WBC 8.3 6.9 10.5  NEUTROABS 6.9  --  9.9*  HGB 14.1 12.2* 12.6*  HCT 43.9 38.1* 38.9*  MCV 100.9* 98.2 97.7  PLT 152 122* 121*    Basic Metabolic Panel:  Recent Labs Lab 11/22/15 0345 11/22/15 1030 11/22/15 1255 11/22/15 1715  11/23/15 0445 11/23/15 1543 11/25/15 0316  NA 142  --  143 140 140 141 140  K 3.9  --  3.5 3.5 3.6 4.1 4.4  CL 110  --  113* 111 110 111 109  CO2 21*  --  22 21* 22 23 21*  GLUCOSE 318*  --  202* 240* 221* 146* 119*  BUN 82*  --  78* 79* 78* 82* 62*  CREATININE 3.99*  --  3.20* 2.87* 2.28* 2.03* 1.45*  CALCIUM 7.4*  --  7.6* 7.7* 7.8* 7.9* 8.7*  MG 2.0 2.1  --  2.0 2.0 1.9  --   PHOS 5.3* 4.4  --  3.9 3.9 4.3  --     GFR: Estimated Creatinine Clearance: 24.5 mL/min (by C-G formula based on SCr of 1.45 mg/dL (H)).  Liver Function Tests:  Recent Labs Lab 11/23/2015 1026 11/22/15 0345 11/22/15 1030 11/23/15 1323  AST 3,426*  --  590* 144*  ALT 2,108*  --  1,191* 765*  ALKPHOS 110  --  84 73  BILITOT 1.1  --  0.8 0.6  PROT 7.4  --  5.5* 5.1*  ALBUMIN 3.6 2.6* 2.4* 2.4*    Recent Labs Lab 11/18/2015 1030  LIPASE 37   No results for input(s): AMMONIA in the last 168 hours.  Coagulation Profile: No results for input(s): INR, PROTIME in the last 168 hours.  Cardiac Enzymes:  Recent Labs Lab 11/22/15 0203 11/23/15 1323 11/24/15 0151 11/24/15 0655 11/24/15 1331  TROPONINI 0.59* 0.26* 0.19* 0.19* 0.15*    BNP (last 3 results) No results for input(s): PROBNP in the last 8760 hours.  HbA1C: No results for input(s): HGBA1C in the last 72 hours.  CBG:  Recent Labs Lab 11/24/15 2021 11/24/15 2335 11/25/15 0404 11/25/15 0855 11/25/15 1115  GLUCAP 118* 115* 120* 115* 117*    Lipid Profile: No results for input(s): CHOL, HDL, LDLCALC, TRIG, CHOLHDL, LDLDIRECT in the last 72 hours.  Thyroid Function Tests:  Recent Labs  11/24/15 0140  TSH 0.457    Anemia Panel: No results for input(s): VITAMINB12, FOLATE, FERRITIN, TIBC, IRON, RETICCTPCT in the last 72 hours.  Urine analysis:    Component Value Date/Time   COLORURINE YELLOW 11/25/2015 2210   APPEARANCEUR CLOUDY (A) 11/20/2015 2210   LABSPEC 1.020 11/09/2015 2210   PHURINE 5.0 11/09/2015 2210    GLUCOSEU NEGATIVE 11/26/2015 2210   HGBUR NEGATIVE 11/07/2015 2210   BILIRUBINUR SMALL (A) 11/24/2015 2210   KETONESUR NEGATIVE 11/13/2015 2210   PROTEINUR 30 (A) 11/11/2015 2210   UROBILINOGEN 0.2 06/23/2011 1739   NITRITE NEGATIVE 11/13/2015 2210   LEUKOCYTESUR NEGATIVE 11/09/2015 2210    Sepsis Labs: Lactic Acid, Venous    Component Value Date/Time   LATICACIDVEN 2.01 (HH) 11/27/2015 1111    MICROBIOLOGY: Recent Results (from the past 240 hour(s))  Blood Culture (routine x 2)     Status: None (Preliminary result)   Collection Time: 11/14/2015 10:25 AM  Result Value Ref Range Status   Specimen Description BLOOD LEFT ARM  5 ML IN [facility] BOTTLE  Final   Special Requests NONE  Final   Culture   Final    NO GROWTH 4 DAYS Performed at [facility]    Report Status PENDING  Incomplete  Blood Culture (routine x 2)     Status: None (Preliminary result)   Collection Time: 11/26/2015 11:10 AM  Result Value Ref Range Status   Specimen Description BLOOD RIGHT FOREARM  5 ML IN AEROBIC ONLY  Final   Special Requests NONE  Final   Culture   Final    NO GROWTH 4 DAYS Performed at [facility]    Report Status PENDING  Incomplete  MRSA PCR Screening     Status: None   Collection Time: 11/19/2015  6:58 PM  Result Value Ref Range Status   MRSA by PCR NEGATIVE NEGATIVE Final    Comment:        The GeneXpert MRSA Assay (FDA approved for NASAL specimens only), is one component of a comprehensive MRSA colonization surveillance program. It is not intended to diagnose MRSA infection nor to guide or monitor treatment for MRSA infections.   Urine culture     Status: None   Collection Time: 12/02/2015 10:10 PM  Result Value Ref Range Status   Specimen Description URINE, RANDOM  Final   Special Requests NONE  Final   Culture NO GROWTH  Final   Report Status 11/23/2015 FINAL  Final  Culture, respiratory (NON-Expectorated)     Status: None   Collection Time: 11/22/15  9:37 AM   Result Value Ref Range Status   Specimen Description TRACHEAL ASPIRATE  Final   Special Requests NONE  Final   Gram Stain   Final    ABUNDANT WBC PRESENT, PREDOMINANTLY PMN RARE GRAM POSITIVE COCCI IN PAIRS    Culture Consistent with normal respiratory flora.  Final   Report Status 11/25/2015 FINAL  Final    RADIOLOGY STUDIES/RESULTS: US Renal  Result Date: 12/01/2015 CLINICAL DATA:  Renal failure, acute on chronic, hypertension, diabetes mellitus, hyperlipidemia, coronary artery disease post MI, CHF, COPD, paroxysmal atrial fibrillation EXAM: RENAL / URINARY TRACT ULTRASOUND COMPLETE COMPARISON:  Abdomen ultrasound 03/28/2013 FINDINGS: Right Kidney: Length: 11.6 cm. Normal cortical thickness. Increased cortical echogenicity. Several tiny RIGHT renal cysts. No solid mass, hydronephrosis or shadowing calcification. Left Kidney: Length: 10.3 cm. Suboptimal visualization due to body habitus and limited positioning (patient on ventilator) poorly visualized. Normal cortical thickness. No gross evidence of mass or hydronephrosis. Bladder: Poorly distended, with suboptimal assessment of wall thickness. Prostate enlargement with gland measuring 4.8 x 3.9 x 3.1 cm. IMPRESSION: Prostatic enlargement. Tiny RIGHT renal cyst. Question medical renal disease changes. Suboptimal visualization of LEFT kidney as above. Electronically Signed   By: Lavonia Dana M.D.   On: 12/05/2015 16:26   Dg Chest Port 1 View  Result Date: 11/24/2015 CLINICAL DATA:  Acute respiratory failure.  Shortness of breath EXAM: PORTABLE CHEST 1 VIEW COMPARISON:  11/23/2015 FINDINGS: Postoperative changes in the mediastinum. Interval removal of endotracheal tube, enteric tube, and central venous catheters. Normal heart size and pulmonary vascularity. Bilateral pleural effusions with basilar infiltration or atelectasis. Old left rib fractures. Calcified and tortuous aorta. No pneumothorax. Degenerative changes in the shoulders. IMPRESSION:  Bilateral pleural effusions with basilar atelectasis or infiltration. Electronically Signed  By: Lucienne Capers M.D.   On: 11/24/2015 02:24   Dg Chest Port 1 View  Result Date: 11/23/2015 CLINICAL DATA:  Status post intubation EXAM: PORTABLE CHEST 1 VIEW COMPARISON:  11/22/2015 FINDINGS: Cardiac shadow is stable. Endotracheal tube, nasogastric catheter and right jugular line are again seen and stable. Lungs are well aerated bilaterally. Increasing right basilar atelectasis is noted. Persistent left basilar infiltrate is noted. Old rib fractures are seen on the left. IMPRESSION: Stable left basilar infiltrate. Increasing right basilar atelectasis. Tubes and lines as described. Electronically Signed   By: Inez Catalina M.D.   On: 11/23/2015 07:55   Dg Chest Port 1 View  Result Date: 11/22/2015 CLINICAL DATA:  75 year old male with respiratory failure. EXAM: PORTABLE CHEST 1 VIEW COMPARISON:  11/16/2015 and prior radiograph FINDINGS: An endotracheal tube with tip 4.8 cm above the carina, NG tube entering the stomach with tip off the field of view and right IJ central venous catheter with tip overlying the mid -lower SVC again noted. Cardiomediastinal silhouette is unchanged. CABG changes again noted. Left lower lung airspace disease and small left pleural effusion again noted. There has been little interval change since the prior study. . IMPRESSION: No significant change. Continued left lower lung airspace disease and small left pleural effusion. Electronically Signed   By: Margarette Canada M.D.   On: 11/22/2015 09:02   Dg Chest Port 1 View  Result Date: 11/24/2015 CLINICAL DATA:  Intubated EXAM: PORTABLE CHEST 1 VIEW COMPARISON:  Chest radiograph from earlier today. FINDINGS: Endotracheal tube tip is 4.1 cm above the carina. Right internal jugular central venous catheter terminates in the lower third of the superior vena cava. Sternotomy wires appear aligned and intact. Stable cardiomediastinal silhouette  with normal heart size and atherosclerotic thoracic aorta. No pneumothorax. Stable small left pleural effusion and patchy left lung base opacity. No pulmonary edema. IMPRESSION: 1. Well-positioned support structures as described. No pneumothorax. 2. Stable patchy left lung base opacity, which could represent aspiration, pneumonia or atelectasis. 3. Probable stable small left pleural effusion. Electronically Signed   By: Ilona Sorrel M.D.   On: 11/24/2015 18:02   Dg Chest Portable 1 View  Result Date: 11/20/2015 CLINICAL DATA:  Productive cough x 2-3 days, worsening of SOB last night; hx CHF, COPD, HTN, dementia, smoker; pt was very unstable, confused, tech was not able to position pt for better AP view. EXAM: PORTABLE CHEST 1 VIEW COMPARISON:  06/23/2011 FINDINGS: Cardiac silhouette is normal in size. There stable changes from previous CABG surgery. No mediastinal or hilar masses. There is hazy opacity in the left lung base. This a reflect atelectasis or pneumonia. Lungs otherwise clear. No convincing pleural effusion. No gross pneumothorax. The bony thorax is demineralized. There are old healed rib fractures on the left. IMPRESSION: 1. Hazy left lung base opacity which may reflect pneumonia or atelectasis. No other evidence of acute cardiopulmonary disease. Electronically Signed   By: Lajean Manes M.D.   On: 12/05/2015 10:46   Dg Swallowing Func-speech Pathology  Result Date: 11/25/2015 Objective Swallowing Evaluation: Type of Study: MBS-Modified Barium Swallow Study Patient Details Name: [name] MRN: [id number] Date of Birth: [birth year] Today's Date: 11/25/2015 Time: SLP Start Time (ACUTE ONLY): 1200-SLP Stop Time (ACUTE ONLY): 1218 SLP Time Calculation (min) (ACUTE ONLY): 18 min Past Medical History: Past Medical History: Diagnosis Date . CHF (congestive heart failure) (HCC)   S/p CABG  . Chronic kidney disease   Baseline Cr appears to be 1.2-1.4  . COPD (  chronic obstructive pulmonary disease)  ([place])  . Coronary artery disease  . CVA (cerebral infarction)   CT head 06/2011 with remote left frontal infarct, remote basal ganglia  lacunar infarcts bilaterally . Dementia  . Diabetes mellitus  . Hyperlipidemia  . Hypertension  . Myocardial infarction ([place])  . Occlusion and stenosis of carotid artery without mention of cerebral infarction  . Paroxysmal a-fib ([place])  . PVD (peripheral vascular disease) ([place])   S/p bilateral BKA's  . Tobacco abuse   States 2 PPD currently  Past Surgical History: Past Surgical History: Procedure Laterality Date . Coronary artery bypass x3  01/04/2002 . Lt below-the-knee amputation  12/07/2009 . RT below-the-knee amputation  07/31/2008 HPI: 75 year old smoker ETOH abuse, COPD, CVA, dementia, bilateral amputee, CABG, HTN nursing home resident admitted for respiratory distress.Intubated 9/16-9/18. CXR Bilateral pleural effusions with basilar atelectasis or infiltration. No prior ST notes. No Data Recorded Assessment / Plan / Recommendation CHL IP CLINICAL IMPRESSIONS 11/25/2015 Therapy Diagnosis Mild pharyngeal phase dysphagia;Mild oral phase dysphagia;Moderate oral phase dysphagia Clinical Impression Mild-mod oral and mild pharyngeal dysphagia due to lingual weakness/discoordination resulting in decreased manipulation and delayed propulsion due to holding boluses. Pharyngeal deficits due to sensory impairment leading to reduced tongue base retraction and decreased laryngeal elevation leaving mild vallecular and pyriform sinus residue. Verbal cues for second swallow reduced amount. Pt coughed prior to barium consumption and frequently throughout study without encroachment of po's into laryngeal vestibule. Increasing aspiration risk is pt's current confused state. Recommend Dys 1 texture and thin liquids, no straws, small sips, swallow twice, FULL supervision. ST will follow.   Impact on safety and function Moderate aspiration risk   CHL IP TREATMENT RECOMMENDATION 11/25/2015 Treatment  Recommendations Therapy as outlined in treatment plan below   Prognosis 11/25/2015 Prognosis for Safe Diet Advancement (No Data) Barriers to Reach Goals Cognitive deficits Barriers/Prognosis Comment -- CHL IP DIET RECOMMENDATION 11/25/2015 SLP Diet Recommendations Dysphagia 1 (Puree) solids;Thin liquid Liquid Administration via Cup;No straw Medication Administration Crushed with puree Compensations Minimize environmental distractions;Slow rate;Small sips/bites;Multiple dry swallows after each bite/sip Postural Changes Seated upright at 90 degrees   CHL IP OTHER RECOMMENDATIONS 11/25/2015 Recommended Consults -- Oral Care Recommendations Oral care BID Other Recommendations --   CHL IP FOLLOW UP RECOMMENDATIONS 11/25/2015 Follow up Recommendations (No Data)   CHL IP FREQUENCY AND DURATION 11/25/2015 Speech Therapy Frequency (ACUTE ONLY) min 2x/week Treatment Duration 2 weeks      CHL IP ORAL PHASE 11/25/2015 Oral Phase Impaired Oral - Pudding Teaspoon -- Oral - Pudding Cup -- Oral - Honey Teaspoon -- Oral - Honey Cup -- Oral - Nectar Teaspoon Delayed oral transit Oral - Nectar Cup Delayed oral transit Oral - Nectar Straw -- Oral - Thin Teaspoon -- Oral - Thin Cup Delayed oral transit Oral - Thin Straw WFL Oral - Puree -- Oral - Mech Soft -- Oral - Regular Delayed oral transit;Weak lingual manipulation Oral - Multi-Consistency -- Oral - Pill -- Oral Phase - Comment --  CHL IP PHARYNGEAL PHASE 11/25/2015 Pharyngeal Phase Impaired Pharyngeal- Pudding Teaspoon -- Pharyngeal -- Pharyngeal- Pudding Cup -- Pharyngeal -- Pharyngeal- Honey Teaspoon -- Pharyngeal -- Pharyngeal- Honey Cup -- Pharyngeal -- Pharyngeal- Nectar Teaspoon WFL Pharyngeal -- Pharyngeal- Nectar Cup Pharyngeal residue - valleculae;Reduced tongue base retraction Pharyngeal -- Pharyngeal- Nectar Straw -- Pharyngeal -- Pharyngeal- Thin Teaspoon -- Pharyngeal -- Pharyngeal- Thin Cup Pharyngeal residue - valleculae;Pharyngeal residue - pyriform;Reduced laryngeal  elevation;Reduced tongue base retraction Pharyngeal -- Pharyngeal- Thin Straw Pharyngeal residue - valleculae;Pharyngeal residue -  pyriform;Reduced tongue base retraction;Reduced laryngeal elevation Pharyngeal -- Pharyngeal- Puree NT Pharyngeal -- Pharyngeal- Mechanical Soft -- Pharyngeal -- Pharyngeal- Regular Pharyngeal residue - valleculae;Reduced tongue base retraction Pharyngeal -- Pharyngeal- Multi-consistency -- Pharyngeal -- Pharyngeal- Pill -- Pharyngeal -- Pharyngeal Comment --  CHL IP CERVICAL ESOPHAGEAL PHASE 11/25/2015 Cervical Esophageal Phase WFL Pudding Teaspoon -- Pudding Cup -- Honey Teaspoon -- Honey Cup -- Nectar Teaspoon -- Nectar Cup -- Nectar Straw -- Thin Teaspoon -- Thin Cup -- Thin Straw -- Puree -- Mechanical Soft -- Regular -- Multi-consistency -- Pill -- Cervical Esophageal Comment -- No flowsheet data found. [place] Siren 11/25/2015, 2:23 PM Orbie Pyo Colvin Caroli.Ed CCC-SLP Pager [phone]                LOS: 4 days   Oren Binet, MD  [facility] Pager:[phone]  If 7PM-7AM, please contact night-coverage [email or web address] Password [email or web address] 11/25/2015, 4:52 PM

## 2015-11-25 NOTE — Clinical Social Work Placement (Signed)
   CLINICAL SOCIAL WORK PLACEMENT  NOTE  Date:  11/25/2015  Patient Details  Name: [name] MRN: [id number] Date of Birth: 04/14/1940  Clinical Social Work is seeking post-discharge placement for this patient at the Skilled  Nursing Facility level of care (*CSW will initial, date and re-position this form in  chart as items are completed):  Yes   Patient/family provided with [facility] Clinical Social Work Department's list of facilities offering this level of care within the geographic area requested by the patient (or if unable, by the patient's family).  Yes   Patient/family informed of their freedom to choose among providers that offer the needed level of care, that participate in Medicare, Medicaid or managed care program needed by the patient, have an available bed and are willing to accept the patient.  Yes   Patient/family informed of 's ownership interest in [facility] and [facility], as well as of the fact that they are under no obligation to receive care at these facilities.  PASRR submitted to EDS on       PASRR number received on       Existing PASRR number confirmed on 11/25/15     FL2 transmitted to all facilities in geographic area requested by pt/family on 11/25/15     FL2 transmitted to all facilities within larger geographic area on       Patient informed that his/her managed care company has contracts with or will negotiate with certain facilities, including the following:            Patient/family informed of bed offers received.  Patient chooses bed at       Physician recommends and patient chooses bed at      Patient to be transferred to   on  .  Patient to be transferred to facility by       Patient family notified on   of transfer.  Name of family member notified:        PHYSICIAN Please sign FL2     Additional Comment:   Macario GoldsJesse Lazariah Savard, LCSW [phone]

## 2015-11-25 NOTE — Evaluation (Signed)
Physical Therapy Evaluation Patient Details Name: Bryan BunnellDonald G [name] MRN: [id number] DOB: 11/30/1940 Today's Date: 11/25/2015   History of Present Illness  [age] year old smoker ETOH abuse, COPD, CVA, dementia, bilateral amputee, CABG, HTN nursing home resident admitted for respiratory distress.Intubated 9/16-9/18. CXR Bilateral pleural effusions with basilar atelectasis or infiltration  Clinical Impression  Patient demonstrated deficits in functional mobility as indicated below. Will need continued skilled PT to address deficits and maximize function. Will see as indicated and progress as tolerated, recommend return to SNF upon acute discharge.    Follow Up Recommendations SNF;Supervision/Assistance - 24 hour    Equipment Recommendations  None recommended by PT    Recommendations for Other Services       Precautions / Restrictions Precautions Precautions: Fall (watch O2 saturations) Restrictions Weight Bearing Restrictions: No      Mobility  Bed Mobility Overal bed mobility: Needs Assistance;+ 2 for safety/equipment Bed Mobility: Rolling;Sidelying to Sit;Sit to Supine Rolling: Mod assist Sidelying to sit: Mod assist;+2 for physical assistance   Sit to supine: Mod assist;+2 for safety/equipment   General bed mobility comments: Moderate assist for aspects of bed mobility, question physical ability vs cogntive desire to perform/follow commands at this time. Support for elevation and rotation of trunk to EOB. Increased gaurding noted. assist 2 person to return to supine aand reposition in bed  Transfers                    Ambulation/Gait             General Gait Details: did not perform  Stairs            Wheelchair Mobility    Modified Rankin (Stroke Patients Only)       Balance Overall balance assessment: Needs assistance Sitting-balance support: Bilateral upper extremity supported Sitting balance-Leahy Scale: Poor (to fair) Sitting balance -  Comments: patient required initial posterior support. Improved with time in sitting. Could performed dynamic trunk activities with bilateral UEs supported in position by therapists (intermittent ability to maintain static sitting) Postural control: Posterior lean                                   Pertinent Vitals/Pain Pain Assessment: No/denies pain    Home Living Family/patient expects to be discharged to:: Skilled nursing facility                      Prior Function Level of Independence: Needs assistance   Gait / Transfers Assistance Needed: per chart review, w/c for mobility           Hand Dominance        Extremity/Trunk Assessment   Upper Extremity Assessment: Defer to OT evaluation           Lower Extremity Assessment: RLE deficits/detail;LLE deficits/detail (bilateral BKAs, limited ROM noted) RLE Deficits / Details: limited extension ROM ~75% range LLE Deficits / Details: limited knee extension ~60% range  Cervical / Trunk Assessment: Kyphotic  Communication   Communication: Expressive difficulties;HOH  Cognition Arousal/Alertness: Awake/alert Behavior During Therapy: WFL for tasks assessed/performed Overall Cognitive Status: History of cognitive impairments - at baseline Area of Impairment: Orientation;Attention;Safety/judgement;Awareness;Problem solving Orientation Level: Disoriented to;Place;Time;Situation Current Attention Level: Sustained     Safety/Judgement: Decreased awareness of safety;Decreased awareness of deficits Awareness: Intellectual Problem Solving: Slow processing;Requires verbal cues;Requires tactile cues      General Comments General comments (skin integrity, edema, etc.):  noted erythema and some maciated skin ofer sacrum and peri-scrotal region    Exercises Other Exercises Other Exercises: dynamic trunk control activities EOB with active assist via UE support. Trunk rotation/flexion/extension Other  Exercises: Bilateral UE dynamic patterned movement elbow flexion/extension in 80-90 degree shoulder flexion Other Exercises: Bilateral UE shoulder flexion   Assessment/Plan    PT Assessment Patient needs continued PT services  PT Problem List Decreased strength;Decreased activity tolerance;Decreased balance;Decreased mobility;Decreased cognition;Decreased safety awareness;Cardiopulmonary status limiting activity;Pain          PT Treatment Interventions Functional mobility training;Therapeutic activities;Therapeutic exercise;Balance training;Cognitive remediation;Patient/family education;Modalities    PT Goals (Current goals can be found in the Care Plan section)  Acute Rehab PT Goals Patient Stated Goal: none stated PT Goal Formulation: Patient unable to participate in goal setting Time For Goal Achievement: 12/09/15 Potential to Achieve Goals: Good    Frequency Min 3X/week   Barriers to discharge        Co-evaluation PT/OT/SLP Co-Evaluation/Treatment: Yes Reason for Co-Treatment: Complexity of the patient's impairments (multi-system involvement);Necessary to address cognition/behavior during functional activity;For patient/therapist safety (activity tolerance) PT goals addressed during session: Mobility/safety with mobility         End of Session Equipment Utilized During Treatment: Oxygen Activity Tolerance: Patient tolerated treatment well;Patient limited by fatigue Patient left: in bed;with call bell/phone within reach;Other (comment) (mitts applied, called nsg to ensure bed alarm set) Nurse Communication: Mobility status         Time: 1025-1050 PT Time Calculation (min) (ACUTE ONLY): 25 min   Charges:   PT Evaluation $PT Eval Moderate Complexity: 1 Procedure     PT G CodesFabio Asa 12/09/15, 11:30 AM Charlotte Crumb, PT DPT  [phone]

## 2015-11-25 NOTE — Evaluation (Signed)
Occupational Therapy Evaluation Patient Details Name: Bryan BunnellDonald G [name] MRN: [id number] DOB: 12/27/1940 Today's Date: 11/25/2015    History of Present Illness 75 year old smoker ETOH abuse, COPD, CVA, dementia, bilateral amputee, CABG, HTN nursing home resident admitted for respiratory distress.Intubated 9/16-9/18. CXR Bilateral pleural effusions with basilar atelectasis or infiltration   Clinical Impression   PT admitted with respiratory distress . Pt currently with functional limitiations due to the deficits listed below (see OT problem list). PTA was from SNF with (A).  Pt will benefit from skilled OT to increase their independence and safety with adls and balance to allow discharge SNF.     Follow Up Recommendations  SNF    Equipment Recommendations  Hospital bed;Wheelchair cushion (measurements OT);Wheelchair (measurements OT)    Recommendations for Other Services       Precautions / Restrictions Precautions Precautions: Fall Precaution Comments: BIL BKA Restrictions Weight Bearing Restrictions: No      Mobility Bed Mobility Overal bed mobility: Needs Assistance;+2 for physical assistance;+ 2 for safety/equipment Bed Mobility: Rolling;Sidelying to Sit;Sit to Supine Rolling: Mod assist Sidelying to sit: Mod assist;+2 for physical assistance   Sit to supine: Mod assist;+2 for safety/equipment   General bed mobility comments: pt requires (A) for sequence task and elevation of trunk. pt initial with posterior lean   Transfers                 General transfer comment: attempting static sitting only this session.     Balance Overall balance assessment: Needs assistance Sitting-balance support: Bilateral upper extremity supported;Feet unsupported Sitting balance-Leahy Scale: Poor Sitting balance - Comments: pt requires BIL UE initially for static sitting and demontrates decr awarenes of bil LE. Pt reports his feet and ankles hurt Postural control:  Posterior lean                                  ADL Overall ADL's : Needs assistance/impaired     Grooming: Wash/dry face;Moderate assistance       Lower Body Bathing: Total assistance       Lower Body Dressing: Total assistance                 General ADL Comments: Pt supine on arrival asking for help to sign documents. pt without any paperwork present. Pt confabulating information throughout session. Session used music to help motivate patient and help keep anxiety low. pt initially fearful of falling but once music used sitting eob unsupported with incr anterior pelvic tilt. pt demonstrates shoulder flexion, trunk rotation and lateral weight shift dancing in a seated position with OT. Pt expressed strong enjoyment with dancing this session     Vision     Perception     Praxis      Pertinent Vitals/Pain Pain Assessment: No/denies pain     Hand Dominance Right   Extremity/Trunk Assessment Upper Extremity Assessment Upper Extremity Assessment: Generalized weakness   Lower Extremity Assessment Lower Extremity Assessment: RLE deficits/detail;LLE deficits/detail RLE Deficits / Details: BKA LLE Deficits / Details: BKA   Cervical / Trunk Assessment Cervical / Trunk Assessment: Kyphotic   Communication Communication Communication: Expressive difficulties;HOH   Cognition Arousal/Alertness: Awake/alert Behavior During Therapy: WFL for tasks assessed/performed Overall Cognitive Status: History of cognitive impairments - at baseline Area of Impairment: Orientation;Attention;Memory Orientation Level: Disoriented to;Person;Place;Situation;Time Current Attention Level: Sustained     Safety/Judgement: Decreased awareness of safety;Decreased awareness of deficits Awareness: Intellectual   General  Comments: Pt unable to verbalize name but when name states patient responds   General Comments       Exercises       Shoulder Instructions       Home Living Family/patient expects to be discharged to:: Skilled nursing facility                                 Additional Comments: patient is from [facility]      Prior Functioning/Environment Level of Independence: Needs assistance  Gait / Transfers Assistance Needed: per chart review, w/c for mobility              OT Problem List: Decreased strength;Decreased activity tolerance;Impaired balance (sitting and/or standing);Decreased cognition;Decreased safety awareness;Decreased knowledge of use of DME or AE;Decreased knowledge of precautions;Cardiopulmonary status limiting activity;Pain   OT Treatment/Interventions: Self-care/ADL training;Therapeutic exercise;Neuromuscular education;DME and/or AE instruction;Therapeutic activities;Cognitive remediation/compensation;Patient/family education;Balance training    OT Goals(Current goals can be found in the care plan section) Acute Rehab OT Goals Patient Stated Goal: none stated OT Goal Formulation: Patient unable to participate in goal setting Time For Goal Achievement: 12/16/15 Potential to Achieve Goals: Fair  OT Frequency: Min 2X/week   Barriers to D/C:            Co-evaluation PT/OT/SLP Co-Evaluation/Treatment: Yes Reason for Co-Treatment: Complexity of the patient's impairments (multi-system involvement);For patient/therapist safety   OT goals addressed during session: ADL's and self-care;Strengthening/ROM      End of Session Equipment Utilized During Treatment: Oxygen Nurse Communication: Mobility status;Precautions  Activity Tolerance: Patient tolerated treatment well Patient left: in bed;with call bell/phone within reach;with bed alarm set;with restraints reapplied   Time: 1025-1049 OT Time Calculation (min): 24 min Charges:  OT General Charges $OT Visit: 1 Procedure OT Evaluation $OT Eval Moderate Complexity: 1 Procedure G-Codes:    Boone Master B November 28, 2015, 3:34 PM  Mateo Flow   OTR/L Pager: [phone] Office: [phone] .

## 2015-11-25 NOTE — Clinical Social Work Note (Signed)
Clinical Social Work Assessment  Patient Details  Name: [name] MRN: [id number] Date of Birth: 08/13/1940  Date of referral:  11/25/15               Reason for consult:  Facility Placement                Permission sought to share information with:  Family Supports Permission granted to share information::  Yes, Verbal Permission Granted  Name::     Davonna BellingDebbie Niazi  Relationship::  Daughter  Contact Information:  [phone]  Housing/Transportation Living arrangements for the past 2 months:  Assisted Living Facility [facility] Source of Information:  Adult Children Patient Interpreter Needed:  None Criminal Activity/Legal Involvement Pertinent to Current Situation/Hospitalization:  No - Comment as needed Significant Relationships:  Adult Children Lives with:  Facility Resident Do you feel safe going back to the place where you live?  Yes Need for family participation in patient care:  Yes (Comment)  Care giving concerns:  Patient daughter states that patient and patient son are both alcoholics and patient son continuously enables patient drinking behaviors.  Patient daughter is concerned that patient will get placed in SNF and patient son may allow him to leave to an unsafe environment.  Patient [place] is [place] out [place] the [place] and no accessible by phone.   Social Worker assessment / plan:  Visual merchandiserClinical Social Worker spoke with patient daughter over the phone to offer support and discuss patient needs at discharge.  Patient daughter states that [facility] to hospital admission, however he needed a higher level of care even prior to hospitalization.  Patient daughter states that patient has been to [facility] and [facility] in the past.  Patient daughter is agreeable with SNF search for all of [place], including the facilities he has been to previously and will await bed offers to determine facility choice.  CSW to  provide bed offers and remain available for support and to facilitate patient discharge needs once medically stable.  Employment status:  Retired Database administratornsurance information:  Managed Medicare PT Recommendations:  Skilled Nursing Facility Information / Referral to community resources:  Skilled Nursing Facility  Patient/Family's Response to care:  Patient daughter verbalized understanding of CSW role and appreciation for support and involvement.  Patient daughter has limited involvement but will act in the best interest of the patient and be supportive of patient placement.  Patient daughter is hopeful that patient will remain at SNF to receive all possible care that can be provided.  Patient/Family's Understanding of and Emotional Response to Diagnosis, Current Treatment, and Prognosis:  Patient daughter is realistic about patient current medical status and is hopeful that patient will receive appropriate care to better his quality of life.  Emotional Assessment Appearance:  Appears older than stated age Attitude/Demeanor/Rapport:  Unable to Assess Affect (typically observed):  Unable to Assess Orientation:  Oriented to Self Alcohol / Substance use:  Alcohol Use Psych involvement (Current and /or in the community):  No (Comment)  Discharge Needs  Concerns to be addressed:  Discharge Planning Concerns, Substance Abuse Concerns Readmission within the last 30 days:  No Current discharge risk:  Dependent with Mobility, Cognitively Impaired Barriers to Discharge:  Continued Medical Work up, Active Substance Use   Macario GoldsJesse Ouida Abeyta, LCSW [phone]

## 2015-11-26 LAB — GLUCOSE, CAPILLARY
GLUCOSE-CAPILLARY: 100 mg/dL — AB (ref 65–99)
GLUCOSE-CAPILLARY: 102 mg/dL — AB (ref 65–99)
GLUCOSE-CAPILLARY: 185 mg/dL — AB (ref 65–99)
GLUCOSE-CAPILLARY: 204 mg/dL — AB (ref 65–99)
Glucose-Capillary: 107 mg/dL — ABNORMAL HIGH (ref 65–99)
Glucose-Capillary: 255 mg/dL — ABNORMAL HIGH (ref 65–99)

## 2015-11-26 LAB — CBC
HEMATOCRIT: 45.2 % (ref 39.0–52.0)
Hemoglobin: 15 g/dL (ref 13.0–17.0)
MCH: 31.8 pg (ref 26.0–34.0)
MCHC: 33.2 g/dL (ref 30.0–36.0)
MCV: 96 fL (ref 78.0–100.0)
PLATELETS: 125 10*3/uL — AB (ref 150–400)
RBC: 4.71 MIL/uL (ref 4.22–5.81)
RDW: 13.8 % (ref 11.5–15.5)
WBC: 9.9 10*3/uL (ref 4.0–10.5)

## 2015-11-26 LAB — CULTURE, BLOOD (ROUTINE X 2)
CULTURE: NO GROWTH
Culture: NO GROWTH

## 2015-11-26 LAB — COMPREHENSIVE METABOLIC PANEL
ALBUMIN: 2.6 g/dL — AB (ref 3.5–5.0)
ALT: 349 U/L — ABNORMAL HIGH (ref 17–63)
AST: 42 U/L — AB (ref 15–41)
Alkaline Phosphatase: 70 U/L (ref 38–126)
Anion gap: 9 (ref 5–15)
BUN: 50 mg/dL — AB (ref 6–20)
CHLORIDE: 112 mmol/L — AB (ref 101–111)
CO2: 23 mmol/L (ref 22–32)
Calcium: 8.7 mg/dL — ABNORMAL LOW (ref 8.9–10.3)
Creatinine, Ser: 1.26 mg/dL — ABNORMAL HIGH (ref 0.61–1.24)
GFR calc Af Amer: >60 mL/min (ref >60)
GFR calc non Af Amer: 54 mL/min — ABNORMAL LOW (ref >60)
GLUCOSE: 97 mg/dL (ref 65–99)
POTASSIUM: 3.9 mmol/L (ref 3.5–5.1)
SODIUM: 144 mmol/L (ref 135–145)
Total Bilirubin: 0.9 mg/dL (ref 0.3–1.2)
Total Protein: 5.3 g/dL — ABNORMAL LOW (ref 6.5–8.1)

## 2015-11-26 MED ORDER — PRO-STAT SUGAR FREE PO LIQD
30.0000 mL | Freq: Three times a day (TID) | ORAL | Status: DC
  Administered 2015-11-26 – 2015-11-28 (×5): 30 mL via ORAL
  Filled 2015-11-26 (×5): qty 30

## 2015-11-26 MED ORDER — DIGOXIN 125 MCG PO TABS
0.1250 mg | ORAL_TABLET | Freq: Every day | ORAL | Status: DC
  Administered 2015-11-27: 0.125 mg via ORAL
  Filled 2015-11-26: qty 1

## 2015-11-26 MED ORDER — DILTIAZEM HCL 30 MG PO TABS
30.0000 mg | ORAL_TABLET | Freq: Three times a day (TID) | ORAL | Status: DC
  Administered 2015-11-26 – 2015-11-28 (×6): 30 mg via ORAL
  Filled 2015-11-26 (×6): qty 1

## 2015-11-26 MED ORDER — AMIODARONE HCL 200 MG PO TABS
200.0000 mg | ORAL_TABLET | Freq: Two times a day (BID) | ORAL | Status: DC
  Administered 2015-11-26 – 2015-11-28 (×5): 200 mg via ORAL
  Filled 2015-11-26 (×5): qty 1

## 2015-11-26 MED ORDER — ASPIRIN EC 81 MG PO TBEC
81.0000 mg | DELAYED_RELEASE_TABLET | Freq: Every day | ORAL | Status: DC
  Administered 2015-11-26 – 2015-11-28 (×3): 81 mg via ORAL
  Filled 2015-11-26 (×3): qty 1

## 2015-11-26 MED ORDER — METHYLPREDNISOLONE SODIUM SUCC 40 MG IJ SOLR
40.0000 mg | Freq: Two times a day (BID) | INTRAMUSCULAR | Status: DC
  Administered 2015-11-26 – 2015-12-02 (×12): 40 mg via INTRAVENOUS
  Filled 2015-11-26 (×13): qty 1

## 2015-11-26 NOTE — Progress Notes (Signed)
Subjective:  Confused; no chest pain  Objective:   Vital Signs : Vitals:   11/26/15 0800 11/26/15 0838 11/26/15 0900 11/26/15 1008  BP: (!) 168/79  (!) 176/107 (!) 170/70  Pulse: 79  61 (!) 102  Resp: 20  20   Temp: 98.5 F (36.9 C)     TempSrc: Axillary     SpO2: 98% 99% 100%   Weight:      Height:        Intake/Output from previous day:  Intake/Output Summary (Last 24 hours) at 11/26/15 1025 Last data filed at 11/26/15 0600  Gross per 24 hour  Intake              479 ml  Output              640 ml  Net             -161 ml    I/O since admission:  Wt Readings from Last 3 Encounters:  11/26/15 143 lb 4.8 oz (65 kg)  06/23/11 154 lb 8.7 oz (70.1 kg)  07/07/09 162 lb (73.5 kg)    Medications: . albuterol  5 mg Nebulization Once  . budesonide (PULMICORT) nebulizer solution  0.5 mg Nebulization BID  . cefTRIAXone (ROCEPHIN)  IV  1 g Intravenous Q24H  . chlorhexidine gluconate (MEDLINE KIT)  15 mL Mouth Rinse BID  . digoxin  0.5 mg Intravenous Daily  . famotidine (PEPCID) IV  20 mg Intravenous Q24H  . fentaNYL (SUBLIMAZE) injection  50 mcg Intravenous Once  . heparin  5,000 Units Subcutaneous Q8H  . insulin aspart  0-9 Units Subcutaneous Q4H  . insulin glargine  8 Units Subcutaneous Daily  . ipratropium-albuterol  3 mL Nebulization TID  . mouth rinse  15 mL Mouth Rinse QID  . methylPREDNISolone (SOLU-MEDROL) injection  40 mg Intravenous Q12H  . metoprolol tartrate  25 mg Oral TID  . pantoprazole sodium  40 mg Per Tube Daily  . sodium chloride  500 mL Intravenous Once  . sodium chloride  500 mL Intravenous Once    . amiodarone 30 mg/hr (11/26/15 6063)    Physical Exam:   General appearance: dishelved; no distress Neck: no adenopathy, no carotid bruit, no JVD, supple, symmetrical, trachea midline, thyroid not enlarged, symmetric, no tenderness/mass/nodules  Lungs: rhochi with faint wheezing Heart: irregularly irregular rhythm 1/6 systolic murmur Abdomen:  soft, non-tender; bowel sounds normal; no masses,  no organomegaly Extremities: bilateral AKA Neurologic: confused; nonfocal   Rate: 93  Rhythm: atrial fibrillation  ECG (independently read by me): AF at 142 from 9/18  Lab Results:   Recent Labs  11/23/15 1543 11/25/15 0316 11/26/15 0246  NA 141 140 144  K 4.1 4.4 3.9  CL 111 109 112*  CO2 23 21* 23  GLUCOSE 146* 119* 97  BUN 82* 62* 50*  CREATININE 2.03* 1.45* 1.26*  CALCIUM 7.9* 8.7* 8.7*  MG 1.9  --   --   PHOS 4.3  --   --     Hepatic Function Latest Ref Rng & Units 11/26/2015 11/23/2015 11/22/2015  Total Protein 6.5 - 8.1 g/dL 5.3(L) 5.1(L) 5.5(L)  Albumin 3.5 - 5.0 g/dL 2.6(L) 2.4(L) 2.4(L)  AST 15 - 41 U/L 42(H) 144(H) 590(H)  ALT 17 - 63 U/L 349(H) 765(H) 1,191(H)  Alk Phosphatase 38 - 126 U/L 70 73 84  Total Bilirubin 0.3 - 1.2 mg/dL 0.9 0.6 0.8  Bilirubin, Direct 0.1 - 0.5 mg/dL - 0.3 0.4  Recent Labs  11/26/15 0246  WBC 9.9  HGB 15.0  HCT 45.2  MCV 96.0  PLT 125*     Recent Labs  11/24/15 0151 11/24/15 0655 11/24/15 1331  TROPONINI 0.19* 0.19* 0.15*    Lab Results  Component Value Date   TSH 0.457 11/24/2015   No results for input(s): HGBA1C in the last 72 hours.   Recent Labs  11/23/15 1323 11/26/15 0246  PROT 5.1* 5.3*  ALBUMIN 2.4* 2.6*  AST 144* 42*  ALT 765* 349*  ALKPHOS 73 70  BILITOT 0.6 0.9  BILIDIR 0.3  --   IBILI 0.3  --    No results for input(s): INR in the last 72 hours. BNP (last 3 results) No results for input(s): BNP in the last 8760 hours.  ProBNP (last 3 results) No results for input(s): PROBNP in the last 8760 hours.   Lipid Panel     Component Value Date/Time   CHOL  12/03/2009 0142    83        ATP III CLASSIFICATION:  <200     mg/dL   Desirable  200-239  mg/dL   Borderline High  >=240    mg/dL   High          TRIG 50 12/03/2009 0142   HDL 34 (L) 12/03/2009 0142   CHOLHDL 2.4 12/03/2009 0142   VLDL 10 12/03/2009 0142   LDLCALC   12/03/2009 0142    39        Total Cholesterol/HDL:CHD Risk Coronary Heart Disease Risk Table                     Men   Women  1/2 Average Risk   3.4   3.3  Average Risk       5.0   4.4  2 X Average Risk   9.6   7.1  3 X Average Risk  23.4   11.0        Use the calculated Patient Ratio above and the CHD Risk Table to determine the patient's CHD Risk.        ATP III CLASSIFICATION (LDL):  <100     mg/dL   Optimal  100-129  mg/dL   Near or Above                    Optimal  130-159  mg/dL   Borderline  160-189  mg/dL   High  >190     mg/dL   Very High      Imaging:  Dg Swallowing Func-speech Pathology  Result Date: 11/25/2015 Objective Swallowing Evaluation: Type of Study: MBS-Modified Barium Swallow Study Patient Details Name: SAAHIL HERBSTER MRN: [id number] Date of Birth: 1940/04/22 Today's Date: 11/25/2015 Time: SLP Start Time (ACUTE ONLY): 1200-SLP Stop Time (ACUTE ONLY): 1218 SLP Time Calculation (min) (ACUTE ONLY): 18 min Past Medical History: Past Medical History: Diagnosis Date . CHF (congestive heart failure) (HCC)   S/p CABG  . Chronic kidney disease   Baseline Cr appears to be 1.2-1.4  . COPD (chronic obstructive pulmonary disease) ([place])  . Coronary artery disease  . CVA (cerebral infarction)   CT head 06/2011 with remote left frontal infarct, remote basal ganglia  lacunar infarcts bilaterally . Dementia  . Diabetes mellitus  . Hyperlipidemia  . Hypertension  . Myocardial infarction ([place])  . Occlusion and stenosis of carotid artery without mention of cerebral infarction  . Paroxysmal a-fib ([place])  . PVD (peripheral vascular disease) (  [place])   S/p bilateral BKA's  . Tobacco abuse   States 2 PPD currently  Past Surgical History: Past Surgical History: Procedure Laterality Date . Coronary artery bypass x3  01/04/2002 . Lt below-the-knee amputation  12/07/2009 . RT below-the-knee amputation  07/31/2008 HPI: 75 year old smoker ETOH abuse, COPD, CVA, dementia, bilateral amputee, CABG, HTN  nursing home resident admitted for respiratory distress.Intubated 9/16-9/18. CXR Bilateral pleural effusions with basilar atelectasis or infiltration. No prior ST notes. No Data Recorded Assessment / Plan / Recommendation CHL IP CLINICAL IMPRESSIONS 11/25/2015 Therapy Diagnosis Mild pharyngeal phase dysphagia;Mild oral phase dysphagia;Moderate oral phase dysphagia Clinical Impression Mild-mod oral and mild pharyngeal dysphagia due to lingual weakness/discoordination resulting in decreased manipulation and delayed propulsion due to holding boluses. Pharyngeal deficits due to sensory impairment leading to reduced tongue base retraction and decreased laryngeal elevation leaving mild vallecular and pyriform sinus residue. Verbal cues for second swallow reduced amount. Pt coughed prior to barium consumption and frequently throughout study without encroachment of po's into laryngeal vestibule. Increasing aspiration risk is pt's current confused state. Recommend Dys 1 texture and thin liquids, no straws, small sips, swallow twice, FULL supervision. ST will follow.   Impact on safety and function Moderate aspiration risk   CHL IP TREATMENT RECOMMENDATION 11/25/2015 Treatment Recommendations Therapy as outlined in treatment plan below   Prognosis 11/25/2015 Prognosis for Safe Diet Advancement (No Data) Barriers to Reach Goals Cognitive deficits Barriers/Prognosis Comment -- CHL IP DIET RECOMMENDATION 11/25/2015 SLP Diet Recommendations Dysphagia 1 (Puree) solids;Thin liquid Liquid Administration via Cup;No straw Medication Administration Crushed with puree Compensations Minimize environmental distractions;Slow rate;Small sips/bites;Multiple dry swallows after each bite/sip Postural Changes Seated upright at 90 degrees   CHL IP OTHER RECOMMENDATIONS 11/25/2015 Recommended Consults -- Oral Care Recommendations Oral care BID Other Recommendations --   CHL IP FOLLOW UP RECOMMENDATIONS 11/25/2015 Follow up Recommendations (No Data)    CHL IP FREQUENCY AND DURATION 11/25/2015 Speech Therapy Frequency (ACUTE ONLY) min 2x/week Treatment Duration 2 weeks      CHL IP ORAL PHASE 11/25/2015 Oral Phase Impaired Oral - Pudding Teaspoon -- Oral - Pudding Cup -- Oral - Honey Teaspoon -- Oral - Honey Cup -- Oral - Nectar Teaspoon Delayed oral transit Oral - Nectar Cup Delayed oral transit Oral - Nectar Straw -- Oral - Thin Teaspoon -- Oral - Thin Cup Delayed oral transit Oral - Thin Straw WFL Oral - Puree -- Oral - Mech Soft -- Oral - Regular Delayed oral transit;Weak lingual manipulation Oral - Multi-Consistency -- Oral - Pill -- Oral Phase - Comment --  CHL IP PHARYNGEAL PHASE 11/25/2015 Pharyngeal Phase Impaired Pharyngeal- Pudding Teaspoon -- Pharyngeal -- Pharyngeal- Pudding Cup -- Pharyngeal -- Pharyngeal- Honey Teaspoon -- Pharyngeal -- Pharyngeal- Honey Cup -- Pharyngeal -- Pharyngeal- Nectar Teaspoon WFL Pharyngeal -- Pharyngeal- Nectar Cup Pharyngeal residue - valleculae;Reduced tongue base retraction Pharyngeal -- Pharyngeal- Nectar Straw -- Pharyngeal -- Pharyngeal- Thin Teaspoon -- Pharyngeal -- Pharyngeal- Thin Cup Pharyngeal residue - valleculae;Pharyngeal residue - pyriform;Reduced laryngeal elevation;Reduced tongue base retraction Pharyngeal -- Pharyngeal- Thin Straw Pharyngeal residue - valleculae;Pharyngeal residue - pyriform;Reduced tongue base retraction;Reduced laryngeal elevation Pharyngeal -- Pharyngeal- Puree NT Pharyngeal -- Pharyngeal- Mechanical Soft -- Pharyngeal -- Pharyngeal- Regular Pharyngeal residue - valleculae;Reduced tongue base retraction Pharyngeal -- Pharyngeal- Multi-consistency -- Pharyngeal -- Pharyngeal- Pill -- Pharyngeal -- Pharyngeal Comment --  CHL IP CERVICAL ESOPHAGEAL PHASE 11/25/2015 Cervical Esophageal Phase WFL Pudding Teaspoon -- Pudding Cup -- Honey Teaspoon -- Honey Cup -- Nectar Teaspoon -- Nectar Cup -- [employer]  Straw -- Thin Teaspoon -- Thin Cup -- Thin Straw -- Puree -- Mechanical Soft -- Regular --  Multi-consistency -- Pill -- Cervical Esophageal Comment -- No flowsheet data found. [place] Siren 11/25/2015, 2:23 PM Orbie Pyo Colvin Caroli.Ed CCC-SLP Pager [phone]                 Assessment/Plan:   Active Problems:   Acute respiratory failure (HCC)   Healthcare-associated pneumonia   Renal failure   Sepsis ([place])   Renal failure (ARF), acute on chronic (HCC)   Endotracheal tube present   Dyspnea   Malnutrition of moderate degree   Elevated liver function tests   Persistent atrial fibrillation (HCC)   CAD in native artery    PAF/Afib with RVR: atrial fibrillation not new. He was remotely treated with coumadin. CHADSVASC score of at least 6 (CHF, HTN, age, DM, CAD). Not a candidate for oral anticoagulation due to ongoing alcohol abuse, dementia and non compliance. He came in in sinus rhythm and converted to afib on Monday 11/23/15. He had rapid ventricular rates but he is now well rate controlled on IV amiodarone, IV digoxin and  Lopressor was transitioned to oral. Will change amiodarone to 200 mg bid, start cardizem 30 mg q 8 hr and continue lopressor 25 mg q 8 for now and titrate as needed.   Elevated troponin: troponin peaked at 0.59--> 0.26--> 0.19--> 0.15. Likely demand ischemia in the setting of known CAD, acute respiratory failure and AKI. He has not complained of any chest pain. Not a great candidate for further ischemic with dementia, alcoholism and non compliance. Will start ASA 81.   HTN: BP has been  elevated today at 165/79.  With AF will add cardizem for BP and rate control  AKI: creat improved from 5.13--> 1.45  -->1.26  Elevated LFTs:  Markedly elevated AST/ALT at 590/1191 on 9/17 but improved now 42/349; will need to monitor closely with amiodarone.    Carotid artery stenosis: continue asa and statin when able to take PO  HLD: resume statin when taking POs  AAA: distal, measured 3 cm x 3 xcm in 2014   Troy Sine, MD, [facility] 11/26/2015, 10:25 AM

## 2015-11-26 NOTE — Progress Notes (Signed)
Speech Language Pathology Treatment: Dysphagia  Patient Details Name: Bryan BunnellDonald G Takaki MRN: [id number] DOB: 02/18/1941 Today's Date: 11/26/2015 Time: 1040-1050 SLP Time Calculation (min) (ACUTE ONLY): 10 min  Assessment / Plan / Recommendation Clinical Impression  Pt appears very ill, coughing at baseline with accessory muscle breathing, eyes are closed pt responds to voice, but speech is not intelligible. Unsure if this is pts typical appearance during hospital stay. Repositioned pt and despite appearance, response to total assist feeding is appropriate and timely. Pt actively takes bites and swallows immediately, lifts head off of bed for sips of wter and labial control is good. After one verbal cue, pt swallows 2- 3 times to fully clear boluses and carries this over through session. Coughing occurs constantly though in MBS it was not indicative of any aspiration. Despite this, risk is significant due to respiratory impairment. Will follow for potential to advance diet depending on function.    HPI HPI: 75 year old smoker ETOH abuse, COPD, CVA, dementia, bilateral amputee, CABG, HTN nursing home resident admitted for respiratory distress.Intubated 9/16-9/18. CXR Bilateral pleural effusions with basilar atelectasis or infiltration. No prior ST notes.      SLP Plan  Continue with current plan of care     Recommendations  Diet recommendations: Dysphagia 1 (puree);Thin liquid Liquids provided via: Cup Medication Administration: Via alternative means Supervision: Full supervision/cueing for compensatory strategies Compensations: Minimize environmental distractions;Slow rate;Small sips/bites;Multiple dry swallows after each bite/sip                Plan: Continue with current plan of care       GO               [facility] D[facility] Utah Eye CentersBonnie Torrance Frech, MA CCC-SLP [phone]  Claudine MoutonDeBlois, Gerren Hoffmeier Caroline 11/26/2015, 10:59 AM

## 2015-11-26 NOTE — Progress Notes (Signed)
Nutrition Follow-up  DOCUMENTATION CODES:   Non-severe (moderate) malnutrition in context of chronic illness  INTERVENTION:   -30 ml Prostat TID, each supplement provides 100 kcals and 15 grams protein  NUTRITION DIAGNOSIS:   Malnutrition related to chronic illness as evidenced by moderate depletion of body fat, moderate depletions of muscle mass.  Ongoing  GOAL:   Patient will meet greater than or equal to 90% of their needs  Progressing  MONITOR:   PO intake, Supplement acceptance, Labs, Weight trends, Skin, I & O's  REASON FOR ASSESSMENT:   Consult Enteral/tube feeding initiation and management  ASSESSMENT:   75 year old smoker and alcoholic, nursing home resident since 08/2015 brought in by EMS to ED for respiratory distress. He has a known history of COPD and CHF. There is a report of dementia and his meds show Aricept. ED evaluation showed acute respiratory acidosis on VBG and he was placed on BiPAP, he was given empiric antibiotics for left-sided infiltrate on portable fil m which have reviewed. Labs showed evidence of acute renal failure and transaminitis, mild lactic acidosis and slight elevated troponin  Pt transferred from ICU to SDU on 12/01/15.   Pt underwent BSE on 11/24/15 and recommended continued NPO status. Pt underwent MBSS on 11/25/15 and was advanced to a dysphagia 1 diet with thin liquids.   SLP followed up with pt just prior to RD visit. Recommend continue with current diet order. Noted pt continues to cough with PO intake, which SLP and RN attribute to COPD.   Case discussed with RN, who reports pt is eating well and tolerating diet well. Meal completion 100% at breakfast. Overall meal completion variable; PO: 25-100%. Reviewed shadow chart; pt was on a dysphagia 1 diet PTA.   Labs reviewed: CBGS: 100-255.   Diet Order:  DIET - DYS 1 Room service appropriate? Yes; Fluid consistency: Thin  Skin:  Reviewed, no issues  Last BM:  11/26/15  Height:    Ht Readings from Last 1 Encounters:  11/24/15 4' (1.219 m)    Weight:   Wt Readings from Last 1 Encounters:  11/26/15 143 lb 4.8 oz (65 kg)    Ideal Body Weight:  72.8 kg (adjusted for bilateral BKA)  BMI:  Body mass index is 43.73 kg/m.  Estimated Nutritional Needs:   Kcal:  1900-2100  Protein:  90-100 grams  Fluid:  1.9 - 2.1 L/day  EDUCATION NEEDS:   No education needs identified at this time  Orlean Holtrop A. Mayford KnifeWilliams, RD, LDN, CDE Pager: [phone] After hours Pager: [phone]

## 2015-11-26 NOTE — Progress Notes (Signed)
Pt transferred to 2H25.

## 2015-11-26 NOTE — Progress Notes (Deleted)
Pt transferred to 2H25. 

## 2015-11-26 NOTE — Progress Notes (Signed)
Patient resting comfortably on 2L [place]. Vitals are stable and no respiratory distress noted. BIPAP not needed at this time. RT will continue to monitor as needed.

## 2015-11-26 NOTE — Progress Notes (Signed)
MD primary text page with message that patient's son would like an update number left on pager.

## 2015-11-26 NOTE — Progress Notes (Signed)
PROGRESS NOTE        PATIENT DETAILS Name: [name] Age: 75 y.o. Sex: male Date of Birth: 10/26/1940 Admit Date: 11/24/2015 Admitting Physician Rigoberto Noel, MD SUP:[id number],XYVOPF DAVIDSON, MD  Brief Narrative: Patient is a 75 y.o. male resident of a skilled nursing facility, history of COPD, CAD status post CABG (2003), PAD status post bilateral BKA, dementia, paroxysmal atrial fibrillation no longer on anticoagulation admitted to the intensive care unit on 9/16 with acute hypercarbic respiratory failure likely from acute exacerbation of COPD. Initially started on BiPAP, however required intubation post admission. He was managed with empiric antibiotics and other supportive measures, and subsequently extubated on 9/18. Hospital course has also been complicated by development of atrial fibrillation with RVR requiring IV digoxin and IV amiodarone infusion, transaminitis and acute renal failure. [facility] on [facility]/20. See below for further details  Subjective: Pleasantly confused-answers some questions appropriately-lying comfortably in bed  Assessment/Plan: Active Problems: Acute hypercarbic respiratory failure: Improved, extubated on 9/18. Secondary to COPD exacerbation. Taper Solu-Medrol, continue bronchodilators.  Severe sepsis secondary to Healthcare associated pneumonia: Improving-sepsis pathophysiology has resolved, patient is now afebrile and without any leukocytosis. . Initially on broad-spectrum antibiotics, now narrowed down to just ceftriaxone-stop date 9/22. Blood cultures negative so far.  Acute metabolic encephalopathy: Probably secondary to sepsis and hypercarbia, improved-suspect current mental status likely reflective of dementia.  Atrial fibrillation with RVR: Rate controlled with IV amiodarone, IV digoxin and IV Lopressor. Cardiology following and directing care.   Very poor long-term candidate  for anticoagulation given alcohol use, dementia and fall risk.  Mildly elevated troponin: Trend is flat and not consistent with ACS. Suspect more secondary to demand ischemia. Resume aspirin when able to tolerate oral intake.  Acute on chronic renal disease stage III: Acute renal failure secondary secondary to ATN. Creatinine improving with supportive care. Nephrology has signed off. Recommendations are not to restart ACEs/ARB on discharge  Dysphagia: Initially Nothing by mouth-seen by speech therapy follow-and underwent modified barium swallow on 9/20-started on a puree diet-spoke with daughter Jackelyn Poling over the phone on 9/21-she is aware of the aspiration risks.   Transaminitis:? Etiology-likely secondary to sepsis or from EtOH/statin use. Acute hepatitis serology negative. LFT's downtrending. Avoid hepatotoxic medications.  Thrombocytopenia: Mild, suspect secondary to sepsis, follow CBC   Type 2 diabetes: CBGs elevated-as patient on steroids-continue 8 units of Lantus and SSI.  History of CAD status post CABG: Resume aspirin and statins when able. Statins on hold due to transaminitis.  History of PAD status post bilateral BKA  AAA: distal, measured 3 cm x 3 xcm in 2014  Dementia with mild delirium: Resume Aricept when oral intake more stable. Continue as needed Haldol.  GERD: Continue PPI  Malnutrition of moderate degree  DVT Prophylaxis: Prophylactic  Heparin   Code Status: DNR  Family Communication: Spoke with daughter Jackelyn Poling over the phone  Disposition Plan: Remain inpatient-remain in SDU-back to SNF on discharge  Antimicrobial agents: See below  Procedures: 9/16 rt ij>>>self 9/18 9/16 ETT>>>9/18  CONSULTS:  cardiology and pulmonary/intensive care  Time spent: 25 minutes-Greater than 50% of this time was spent in counseling, explanation of diagnosis, planning of further management, and coordination of care.  MEDICATIONS: Anti-infectives    Start     Dose/Rate  Route Frequency Ordered Stop   11/23/15 1030  cefTRIAXone (  ROCEPHIN) 1 g in dextrose 5 % 50 mL IVPB     1 g 100 mL/hr over 30 Minutes Intravenous Every 24 hours 11/23/15 0926     11/23/15 0900  piperacillin-tazobactam (ZOSYN) IVPB 3.375 g  Status:  Discontinued     3.375 g 12.5 mL/hr over 240 Minutes Intravenous Every 8 hours 11/23/15 0802 11/23/15 0926   11/23/15 0900  vancomycin (VANCOCIN) IVPB 750 mg/150 ml premix  Status:  Discontinued     750 mg 150 mL/hr over 60 Minutes Intravenous Every 24 hours 11/23/15 0804 11/23/15 0921   11/23/15 0800  vancomycin (VANCOCIN) IVPB 1000 mg/200 mL premix  Status:  Discontinued     1,000 mg 200 mL/hr over 60 Minutes Intravenous Every 48 hours 11/23/2015 1621 11/23/15 0804   11/22/15 1400  ceFEPIme (MAXIPIME) 1 g in dextrose 5 % 50 mL IVPB  Status:  Discontinued     1 g 100 mL/hr over 30 Minutes Intravenous Every 24 hours 11/29/2015 1621 11/23/15 0800   11/20/2015 1115  ceFEPIme (MAXIPIME) 2 g in dextrose 5 % 50 mL IVPB     2 g 100 mL/hr over 30 Minutes Intravenous  Once 11/13/2015 1103 11/06/2015 1152   11/22/2015 1115  vancomycin (VANCOCIN) IVPB 1000 mg/200 mL premix     1,000 mg 200 mL/hr over 60 Minutes Intravenous  Once 11/18/2015 1103 11/23/2015 1257      Scheduled Meds: . albuterol  5 mg Nebulization Once  . budesonide (PULMICORT) nebulizer solution  0.5 mg Nebulization BID  . cefTRIAXone (ROCEPHIN)  IV  1 g Intravenous Q24H  . chlorhexidine gluconate (MEDLINE KIT)  15 mL Mouth Rinse BID  . digoxin  0.5 mg Intravenous Daily  . famotidine (PEPCID) IV  20 mg Intravenous Q24H  . fentaNYL (SUBLIMAZE) injection  50 mcg Intravenous Once  . heparin  5,000 Units Subcutaneous Q8H  . insulin aspart  0-9 Units Subcutaneous Q4H  . insulin glargine  8 Units Subcutaneous Daily  . ipratropium-albuterol  3 mL Nebulization TID  . mouth rinse  15 mL Mouth Rinse QID  . methylPREDNISolone (SOLU-MEDROL) injection  40 mg Intravenous Q6H  . metoprolol tartrate  25 mg  Oral TID  . pantoprazole sodium  40 mg Per Tube Daily  . sodium chloride  500 mL Intravenous Once  . sodium chloride  500 mL Intravenous Once   Continuous Infusions: . amiodarone 30 mg/hr (11/26/15 0213)   PRN Meds:.sodium chloride, acetaminophen, acetaminophen, fentaNYL (SUBLIMAZE) injection, haloperidol lactate   PHYSICAL EXAM: Vital signs: Vitals:   11/26/15 0600 11/26/15 0700 11/26/15 0800 11/26/15 0838  BP: (!) 157/79 (!) 161/71 (!) 168/79   Pulse: 78 83 79   Resp: (!) _0 Temp:   98.5 F (36.9 C)   TempSrc:   Axillary   SpO2: 91% 99% 98% 99%  Weight:      Height:       Filed Weights   11/24/15 0358 11/25/15 0110 11/26/15 0437  Weight: 66.5 kg (146 lb 9.7 oz) 66.3 kg (146 lb 2.6 oz) 65 kg (143 lb 4.8 oz)   Body mass index is 43.73 kg/m.   General appearance :Awake,Pleasantly confused but not in any distress. Lying comfortably Eyes:, pupils equally reactive to light and accomodation,no scleral icterus.Pink conjunctiva HEENT: Atraumatic and Normocephalic Neck: supple, no JVD. No cervical lymphadenopathy. No thyromegaly Resp:Good air entry bilaterally, rhonchi scattered all over CVS: S1 S2 irregular, no murmurs.  GI: Bowel sounds present, Non tender and not distended with no  gaurding, rigidity or rebound.No organomegaly Extremities: B/L BKA-stump intact without any ulceration Neurology:  Moving all 4 extremities  Psychiatric: Pleasant and confused. Musculoskeletal:No digital cyanosis in upper extremities  I have personally reviewed following labs and imaging studies  LABORATORY DATA: CBC:  Recent Labs Lab 11/13/2015 1026 11/22/15 0345 11/23/15 0445 11/26/15 0246  WBC 8.3 6.9 10.5 9.9  NEUTROABS 6.9  --  9.9*  --   HGB 14.1 12.2* 12.6* 15.0  HCT 43.9 38.1* 38.9* 45.2  MCV 100.9* 98.2 97.7 96.0  PLT 152 122* 121* 125*    Basic Metabolic Panel:  Recent Labs Lab 11/22/15 0345 11/22/15 1030  11/22/15 1715 11/23/15 0445 11/23/15 1543  11/25/15 0316 11/26/15 0246  NA 142  --   < > 140 140 141 140 144  K 3.9  --   < > 3.5 3.6 4.1 4.4 3.9  CL 110  --   < > 111 110 111 109 112*  CO2 21*  --   < > 21* 22 23 21* 23  GLUCOSE 318*  --   < > 240* 221* 146* 119* 97  BUN 82*  --   < > 79* 78* 82* 62* 50*  CREATININE 3.99*  --   < > 2.87* 2.28* 2.03* 1.45* 1.26*  CALCIUM 7.4*  --   < > 7.7* 7.8* 7.9* 8.7* 8.7*  MG 2.0 2.1  --  2.0 2.0 1.9  --   --   PHOS 5.3* 4.4  --  3.9 3.9 4.3  --   --   < > = values in this interval not displayed.  GFR: Estimated Creatinine Clearance: 27.8 mL/min (by C-G formula based on SCr of 1.26 mg/dL (H)).  Liver Function Tests:  Recent Labs Lab 11/20/2015 1026 11/22/15 0345 11/22/15 1030 11/23/15 1323 11/26/15 0246  AST 3,426*  --  590* 144* 42*  ALT 2,108*  --  1,191* 765* 349*  ALKPHOS 110  --  84 73 70  BILITOT 1.1  --  0.8 0.6 0.9  PROT 7.4  --  5.5* 5.1* 5.3*  ALBUMIN 3.6 2.6* 2.4* 2.4* 2.6*    Recent Labs Lab 11/13/2015 1030  LIPASE 37   No results for input(s): AMMONIA in the last 168 hours.  Coagulation Profile: No results for input(s): INR, PROTIME in the last 168 hours.  Cardiac Enzymes:  Recent Labs Lab 11/22/15 0203 11/23/15 1323 11/24/15 0151 11/24/15 0655 11/24/15 1331  TROPONINI 0.59* 0.26* 0.19* 0.19* 0.15*    BNP (last 3 results) No results for input(s): PROBNP in the last 8760 hours.  HbA1C: No results for input(s): HGBA1C in the last 72 hours.  CBG:  Recent Labs Lab 11/25/15 1649 11/25/15 2027 11/26/15 0023 11/26/15 0440 11/26/15 0826  GLUCAP 124* 147* 107* 100* 102*    Lipid Profile: No results for input(s): CHOL, HDL, LDLCALC, TRIG, CHOLHDL, LDLDIRECT in the last 72 hours.  Thyroid Function Tests:  Recent Labs  11/24/15 0140  TSH 0.457    Anemia Panel: No results for input(s): VITAMINB12, FOLATE, FERRITIN, TIBC, IRON, RETICCTPCT in the last 72 hours.  Urine analysis:    Component Value Date/Time   COLORURINE YELLOW  11/16/2015 2210   APPEARANCEUR CLOUDY (A) 11/20/2015 2210   LABSPEC 1.020 11/27/2015 2210   PHURINE 5.0 12/04/2015 2210   GLUCOSEU NEGATIVE 11/17/2015 2210   HGBUR NEGATIVE 11/24/2015 2210   BILIRUBINUR SMALL (A) 11/11/2015 2210   KETONESUR NEGATIVE 12/02/2015 2210   PROTEINUR 30 (A) 11/26/2015 2210   UROBILINOGEN 0.2 06/23/2011  [place] 11/11/2015 2210   LEUKOCYTESUR NEGATIVE 11/27/2015 2210    Sepsis Labs: Lactic Acid, Venous    Component Value Date/Time   LATICACIDVEN 2.01 (HH) 11/14/2015 1111    MICROBIOLOGY: Recent Results (from the past 240 hour(s))  Blood Culture (routine x 2)     Status: None (Preliminary result)   Collection Time: 11/16/2015 10:25 AM  Result Value Ref Range Status   Specimen Description BLOOD LEFT ARM  5 ML IN [facility] BOTTLE  Final   Special Requests NONE  Final   Culture   Final    NO GROWTH 4 DAYS Performed at [facility]    Report Status PENDING  Incomplete  Blood Culture (routine x 2)     Status: None (Preliminary result)   Collection Time: 11/18/2015 11:10 AM  Result Value Ref Range Status   Specimen Description BLOOD RIGHT FOREARM  5 ML IN AEROBIC ONLY  Final   Special Requests NONE  Final   Culture   Final    NO GROWTH 4 DAYS Performed at [facility]    Report Status PENDING  Incomplete  MRSA PCR Screening     Status: None   Collection Time: 11/23/2015  6:58 PM  Result Value Ref Range Status   MRSA by PCR NEGATIVE NEGATIVE Final    Comment:        The GeneXpert MRSA Assay (FDA approved for NASAL specimens only), is one component of a comprehensive MRSA colonization surveillance program. It is not intended to diagnose MRSA infection nor to guide or monitor treatment for MRSA infections.   Urine culture     Status: None   Collection Time: 11/11/2015 10:10 PM  Result Value Ref Range Status   Specimen Description URINE, RANDOM  Final   Special Requests NONE  Final   Culture NO GROWTH  Final   Report  Status 11/23/2015 FINAL  Final  Culture, respiratory (NON-Expectorated)     Status: None   Collection Time: 11/22/15  9:37 AM  Result Value Ref Range Status   Specimen Description TRACHEAL ASPIRATE  Final   Special Requests NONE  Final   Gram Stain   Final    ABUNDANT WBC PRESENT, PREDOMINANTLY PMN RARE GRAM POSITIVE COCCI IN PAIRS    Culture Consistent with normal respiratory flora.  Final   Report Status 11/25/2015 FINAL  Final    RADIOLOGY STUDIES/RESULTS: US Renal  Result Date: 11/13/2015 CLINICAL DATA:  Renal failure, acute on chronic, hypertension, diabetes mellitus, hyperlipidemia, coronary artery disease post MI, CHF, COPD, paroxysmal atrial fibrillation EXAM: RENAL / URINARY TRACT ULTRASOUND COMPLETE COMPARISON:  Abdomen ultrasound 03/28/2013 FINDINGS: Right Kidney: Length: 11.6 cm. Normal cortical thickness. Increased cortical echogenicity. Several tiny RIGHT renal cysts. No solid mass, hydronephrosis or shadowing calcification. Left Kidney: Length: 10.3 cm. Suboptimal visualization due to body habitus and limited positioning (patient on ventilator) poorly visualized. Normal cortical thickness. No gross evidence of mass or hydronephrosis. Bladder: Poorly distended, with suboptimal assessment of wall thickness. Prostate enlargement with gland measuring 4.8 x 3.9 x 3.1 cm. IMPRESSION: Prostatic enlargement. Tiny RIGHT renal cyst. Question medical renal disease changes. Suboptimal visualization of LEFT kidney as above. Electronically Signed   By: Lavonia Dana M.D.   On: 11/28/2015 16:26   Dg Chest Port 1 View  Result Date: 11/24/2015 CLINICAL DATA:  Acute respiratory failure.  Shortness of breath EXAM: PORTABLE CHEST 1 VIEW COMPARISON:  11/23/2015 FINDINGS: Postoperative changes in the mediastinum. Interval removal of endotracheal tube, enteric  tube, and central venous catheters. Normal heart size and pulmonary vascularity. Bilateral pleural effusions with basilar infiltration or  atelectasis. Old left rib fractures. Calcified and tortuous aorta. No pneumothorax. Degenerative changes in the shoulders. IMPRESSION: Bilateral pleural effusions with basilar atelectasis or infiltration. Electronically Signed   By: Lucienne Capers M.D.   On: 11/24/2015 02:24   Dg Chest Port 1 View  Result Date: 11/23/2015 CLINICAL DATA:  Status post intubation EXAM: PORTABLE CHEST 1 VIEW COMPARISON:  11/22/2015 FINDINGS: Cardiac shadow is stable. Endotracheal tube, nasogastric catheter and right jugular line are again seen and stable. Lungs are well aerated bilaterally. Increasing right basilar atelectasis is noted. Persistent left basilar infiltrate is noted. Old rib fractures are seen on the left. IMPRESSION: Stable left basilar infiltrate. Increasing right basilar atelectasis. Tubes and lines as described. Electronically Signed   By: Inez Catalina M.D.   On: 11/23/2015 07:55   Dg Chest Port 1 View  Result Date: 11/22/2015 CLINICAL DATA:  75 year old male with respiratory failure. EXAM: PORTABLE CHEST 1 VIEW COMPARISON:  11/08/2015 and prior radiograph FINDINGS: An endotracheal tube with tip 4.8 cm above the carina, NG tube entering the stomach with tip off the field of view and right IJ central venous catheter with tip overlying the mid -lower SVC again noted. Cardiomediastinal silhouette is unchanged. CABG changes again noted. Left lower lung airspace disease and small left pleural effusion again noted. There has been little interval change since the prior study. . IMPRESSION: No significant change. Continued left lower lung airspace disease and small left pleural effusion. Electronically Signed   By: Margarette Canada M.D.   On: 11/22/2015 09:02   Dg Chest Port 1 View  Result Date: 12/04/2015 CLINICAL DATA:  Intubated EXAM: PORTABLE CHEST 1 VIEW COMPARISON:  Chest radiograph from earlier today. FINDINGS: Endotracheal tube tip is 4.1 cm above the carina. Right internal jugular central venous catheter  terminates in the lower third of the superior vena cava. Sternotomy wires appear aligned and intact. Stable cardiomediastinal silhouette with normal heart size and atherosclerotic thoracic aorta. No pneumothorax. Stable small left pleural effusion and patchy left lung base opacity. No pulmonary edema. IMPRESSION: 1. Well-positioned support structures as described. No pneumothorax. 2. Stable patchy left lung base opacity, which could represent aspiration, pneumonia or atelectasis. 3. Probable stable small left pleural effusion. Electronically Signed   By: Ilona Sorrel M.D.   On: 11/06/2015 18:02   Dg Chest Portable 1 View  Result Date: 11/16/2015 CLINICAL DATA:  Productive cough x 2-3 days, worsening of SOB last night; hx CHF, COPD, HTN, dementia, smoker; pt was very unstable, confused, tech was not able to position pt for better AP view. EXAM: PORTABLE CHEST 1 VIEW COMPARISON:  06/23/2011 FINDINGS: Cardiac silhouette is normal in size. There stable changes from previous CABG surgery. No mediastinal or hilar masses. There is hazy opacity in the left lung base. This a reflect atelectasis or pneumonia. Lungs otherwise clear. No convincing pleural effusion. No gross pneumothorax. The bony thorax is demineralized. There are old healed rib fractures on the left. IMPRESSION: 1. Hazy left lung base opacity which may reflect pneumonia or atelectasis. No other evidence of acute cardiopulmonary disease. Electronically Signed   By: Lajean Manes M.D.   On: 12/01/2015 10:46   Dg Swallowing Func-speech Pathology  Result Date: 11/25/2015 Objective Swallowing Evaluation: Type of Study: MBS-Modified Barium Swallow Study Patient Details Name: [name] MRN: [id number] Date of Birth: 1940-08-19 Today's Date: 11/25/2015 Time: SLP Start Time (ACUTE ONLY):  1200-SLP Stop Time (ACUTE ONLY): 1218 SLP Time Calculation (min) (ACUTE ONLY): 18 min Past Medical History: Past Medical History: Diagnosis Date . CHF (congestive heart  failure) (HCC)   S/p CABG  . Chronic kidney disease   Baseline Cr appears to be 1.2-1.4  . COPD (chronic obstructive pulmonary disease) ([place])  . Coronary artery disease  . CVA (cerebral infarction)   CT head 06/2011 with remote left frontal infarct, remote basal ganglia  lacunar infarcts bilaterally . Dementia  . Diabetes mellitus  . Hyperlipidemia  . Hypertension  . Myocardial infarction ([place])  . Occlusion and stenosis of carotid artery without mention of cerebral infarction  . Paroxysmal a-fib ([place])  . PVD (peripheral vascular disease) ([place])   S/p bilateral BKA's  . Tobacco abuse   States 2 PPD currently  Past Surgical History: Past Surgical History: Procedure Laterality Date . Coronary artery bypass x3  01/04/2002 . Lt below-the-knee amputation  12/07/2009 . RT below-the-knee amputation  07/31/2008 HPI: 75 year old smoker ETOH abuse, COPD, CVA, dementia, bilateral amputee, CABG, HTN nursing home resident admitted for respiratory distress.Intubated 9/16-9/18. CXR Bilateral pleural effusions with basilar atelectasis or infiltration. No prior ST notes. No Data Recorded Assessment / Plan / Recommendation CHL IP CLINICAL IMPRESSIONS 11/25/2015 Therapy Diagnosis Mild pharyngeal phase dysphagia;Mild oral phase dysphagia;Moderate oral phase dysphagia Clinical Impression Mild-mod oral and mild pharyngeal dysphagia due to lingual weakness/discoordination resulting in decreased manipulation and delayed propulsion due to holding boluses. Pharyngeal deficits due to sensory impairment leading to reduced tongue base retraction and decreased laryngeal elevation leaving mild vallecular and pyriform sinus residue. Verbal cues for second swallow reduced amount. Pt coughed prior to barium consumption and frequently throughout study without encroachment of po's into laryngeal vestibule. Increasing aspiration risk is pt's current confused state. Recommend Dys 1 texture and thin liquids, no straws, small sips, swallow twice, FULL  supervision. ST will follow.   Impact on safety and function Moderate aspiration risk   CHL IP TREATMENT RECOMMENDATION 11/25/2015 Treatment Recommendations Therapy as outlined in treatment plan below   Prognosis 11/25/2015 Prognosis for Safe Diet Advancement (No Data) Barriers to Reach Goals Cognitive deficits Barriers/Prognosis Comment -- CHL IP DIET RECOMMENDATION 11/25/2015 SLP Diet Recommendations Dysphagia 1 (Puree) solids;Thin liquid Liquid Administration via Cup;No straw Medication Administration Crushed with puree Compensations Minimize environmental distractions;Slow rate;Small sips/bites;Multiple dry swallows after each bite/sip Postural Changes Seated upright at 90 degrees   CHL IP OTHER RECOMMENDATIONS 11/25/2015 Recommended Consults -- Oral Care Recommendations Oral care BID Other Recommendations --   CHL IP FOLLOW UP RECOMMENDATIONS 11/25/2015 Follow up Recommendations (No Data)   CHL IP FREQUENCY AND DURATION 11/25/2015 Speech Therapy Frequency (ACUTE ONLY) min 2x/week Treatment Duration 2 weeks      CHL IP ORAL PHASE 11/25/2015 Oral Phase Impaired Oral - Pudding Teaspoon -- Oral - Pudding Cup -- Oral - Honey Teaspoon -- Oral - Honey Cup -- Oral - Nectar Teaspoon Delayed oral transit Oral - Nectar Cup Delayed oral transit Oral - Nectar Straw -- Oral - Thin Teaspoon -- Oral - Thin Cup Delayed oral transit Oral - Thin Straw WFL Oral - Puree -- Oral - Mech Soft -- Oral - Regular Delayed oral transit;Weak lingual manipulation Oral - Multi-Consistency -- Oral - Pill -- Oral Phase - Comment --  CHL IP PHARYNGEAL PHASE 11/25/2015 Pharyngeal Phase Impaired Pharyngeal- Pudding Teaspoon -- Pharyngeal -- Pharyngeal- Pudding Cup -- Pharyngeal -- Pharyngeal- Honey Teaspoon -- Pharyngeal -- Pharyngeal- Honey Cup -- Pharyngeal -- Pharyngeal- Nectar Teaspoon WFL Pharyngeal -- Pharyngeal- Nectar  Cup Pharyngeal residue - valleculae;Reduced tongue base retraction Pharyngeal -- Pharyngeal- Nectar Straw -- Pharyngeal --  Pharyngeal- Thin Teaspoon -- Pharyngeal -- Pharyngeal- Thin Cup Pharyngeal residue - valleculae;Pharyngeal residue - pyriform;Reduced laryngeal elevation;Reduced tongue base retraction Pharyngeal -- Pharyngeal- Thin Straw Pharyngeal residue - valleculae;Pharyngeal residue - pyriform;Reduced tongue base retraction;Reduced laryngeal elevation Pharyngeal -- Pharyngeal- Puree NT Pharyngeal -- Pharyngeal- Mechanical Soft -- Pharyngeal -- Pharyngeal- Regular Pharyngeal residue - valleculae;Reduced tongue base retraction Pharyngeal -- Pharyngeal- Multi-consistency -- Pharyngeal -- Pharyngeal- Pill -- Pharyngeal -- Pharyngeal Comment --  CHL IP CERVICAL ESOPHAGEAL PHASE 11/25/2015 Cervical Esophageal Phase WFL Pudding Teaspoon -- Pudding Cup -- Honey Teaspoon -- Honey Cup -- Nectar Teaspoon -- Nectar Cup -- Nectar Straw -- Thin Teaspoon -- Thin Cup -- Thin Straw -- Puree -- Mechanical Soft -- Regular -- Multi-consistency -- Pill -- Cervical Esophageal Comment -- No flowsheet data found. [place] Siren 11/25/2015, 2:23 PM Orbie Pyo Colvin Caroli.Ed CCC-SLP Pager [phone]                LOS: 5 days   Oren Binet, MD  [facility] Pager:[phone]  If 7PM-7AM, please contact night-coverage [email or web address] Password [facility] 11/26/2015, 8:53 AM

## 2015-11-27 ENCOUNTER — Inpatient Hospital Stay (HOSPITAL_COMMUNITY): Payer: Medicare Other

## 2015-11-27 DIAGNOSIS — I480 Paroxysmal atrial fibrillation: Secondary | ICD-10-CM

## 2015-11-27 DIAGNOSIS — I4891 Unspecified atrial fibrillation: Secondary | ICD-10-CM

## 2015-11-27 LAB — COMPREHENSIVE METABOLIC PANEL
ALK PHOS: 64 U/L (ref 38–126)
ALT: 232 U/L — ABNORMAL HIGH (ref 17–63)
ANION GAP: 8 (ref 5–15)
AST: 20 U/L (ref 15–41)
Albumin: 2.7 g/dL — ABNORMAL LOW (ref 3.5–5.0)
BUN: 42 mg/dL — ABNORMAL HIGH (ref 6–20)
CALCIUM: 8.7 mg/dL — AB (ref 8.9–10.3)
CO2: 28 mmol/L (ref 22–32)
Chloride: 111 mmol/L (ref 101–111)
Creatinine, Ser: 1.17 mg/dL (ref 0.61–1.24)
GFR calc non Af Amer: 59 mL/min — ABNORMAL LOW (ref >60)
Glucose, Bld: 193 mg/dL — ABNORMAL HIGH (ref 65–99)
Potassium: 3.9 mmol/L (ref 3.5–5.1)
SODIUM: 147 mmol/L — AB (ref 135–145)
Total Bilirubin: 1.1 mg/dL (ref 0.3–1.2)
Total Protein: 5.2 g/dL — ABNORMAL LOW (ref 6.5–8.1)

## 2015-11-27 LAB — GLUCOSE, CAPILLARY
GLUCOSE-CAPILLARY: 153 mg/dL — AB (ref 65–99)
GLUCOSE-CAPILLARY: 182 mg/dL — AB (ref 65–99)
GLUCOSE-CAPILLARY: 183 mg/dL — AB (ref 65–99)
GLUCOSE-CAPILLARY: 75 mg/dL (ref 65–99)
Glucose-Capillary: 124 mg/dL — ABNORMAL HIGH (ref 65–99)
Glucose-Capillary: 170 mg/dL — ABNORMAL HIGH (ref 65–99)

## 2015-11-27 LAB — ECHOCARDIOGRAM COMPLETE
Height: 48 in
WEIGHTICAEL: 2296.31 [oz_av]

## 2015-11-27 MED ORDER — FAMOTIDINE 20 MG PO TABS
20.0000 mg | ORAL_TABLET | Freq: Every day | ORAL | Status: DC
  Administered 2015-11-27: 20 mg via ORAL
  Filled 2015-11-27: qty 1

## 2015-11-27 MED ORDER — FUROSEMIDE 10 MG/ML IJ SOLN
20.0000 mg | Freq: Once | INTRAMUSCULAR | Status: AC
  Administered 2015-11-27: 20 mg via INTRAVENOUS
  Filled 2015-11-27: qty 2

## 2015-11-27 NOTE — Progress Notes (Signed)
*  PRELIMINARY RESULTS* Echocardiogram 2D Echocardiogram has been performed.  [name], [name] 11/27/2015, 12:19 PM

## 2015-11-27 NOTE — Progress Notes (Signed)
Pts 2 RA Ivs leaking at 1400, removed and monitored. Dr paged and informed at 1600 after site got increasingly red and weepy. MD wants RN to apply cold presses to site and monitor.

## 2015-11-27 NOTE — Progress Notes (Signed)
PROGRESS NOTE        PATIENT DETAILS Name: Bryan BunnellDonald G [name] Age: 75 y.o. Sex: male Date of Birth: 08/20/1940 Admit Date: May 20, 2015 Admitting Physician Oretha Milchakesh V Alva, MD ZOX:[id number]:PHARR,WALTER DAVIDSON, MD  Brief Narrative: Patient is a 75 y.o. male resident of a skilled nursing facility, history of COPD, CAD status post CABG (2003), PAD status post bilateral BKA, dementia, paroxysmal atrial fibrillation no longer on anticoagulation admitted to the intensive care unit on 9/16 with acute hypercarbic respiratory failure likely from acute exacerbation of COPD. Initially started on BiPAP, however required intubation post admission. He was managed with empiric antibiotics and other supportive measures, and subsequently extubated on 9/18. Hospital course has also been complicated by development of atrial fibrillation with RVR requiring IV digoxin and IV amiodarone infusion, transaminitis and acute renal failure. Patient was transferred [facility] on [facility]/20. See below for further details  Subjective: Still confused-but follows commands today. Lying comfortably in bed.  Assessment/Plan: Active Problems: Acute hypercarbic respiratory failure: Improved, extubated on 9/18. Secondary to COPD exacerbation. Taper Solu-Medrol, continue bronchodilators.  Severe sepsis secondary to Healthcare associated pneumonia: Improving-sepsis pathophysiology has resolved, patient is now afebrile and without any leukocytosis. Initially on broad-spectrum antibiotics, now narrowed down to just ceftriaxone-stop date 9/24. Blood cultures negative so far.  Acute metabolic encephalopathy: Probably secondary to sepsis and hypercarbia, improved-suspect current mental status likely reflective of dementia.  Atrial fibrillation with RVR: Initially required rate control with with IV amiodarone, IV digoxin and IV Lopressor. Patient is now back in sinus rhythm. Cardiology following and  directing care. He is now on oral medications. Although he has a chads2vasc score of 6,he is a very poor long-term candidate for anticoagulation given alcohol use, dementia and fall risk.  Mildly elevated troponin: Trend is flat and not consistent with ACS. Suspect more secondary to demand ischemia. Resume aspirin when able to tolerate oral intake.  Acute on chronic renal disease stage III: Acute renal failure secondary secondary to ATN. Creatinine improving with supportive care. Nephrology has signed off. Recommendations are not to restart ACEs/ARB on discharge  Dysphagia: Initially Nothing by mouth-seen by speech therapy follow-and underwent modified barium swallow on 9/20-started on a puree diet-spoke with daughter Eunice BlaseDebbie and son Moise BoringDonnie over the phone on 9/21-both of them are aware of the aspiration risks.   Transaminitis:? Etiology-likely secondary to sepsis or from EtOH/statin use. Acute hepatitis serology negative. LFT's downtrending. Avoid hepatotoxic medications.  Thrombocytopenia: Mild, suspect secondary to sepsis, follow CBC   Type 2 diabetes: CBGs elevated-as patient on steroids-continue 8 units of Lantus and SSI.  History of CAD status post CABG: Resume aspirin and statins when able. Statins on hold due to transaminitis.  History of PAD status post bilateral BKA  AAA: distal, measured 3 cm x 3 xcm in 2014  Dementia with mild delirium: Resume Aricept when oral intake more stable. Continue as needed Haldol.  GERD: Continue PPI  Malnutrition of moderate degree  DVT Prophylaxis: Prophylactic  Heparin   Code Status: DNR  Family Communication: Spoke with daughter Eunice BlaseDebbie and son Moise BoringDonnie over the phone  Disposition Plan: Remain transfer to telemetry today, suspect requires another day or two in the hospital before being discharged back to SNF  Antimicrobial agents: See below  Procedures: 9/16 rt ij>>>self 9/18 9/16 ETT>>>9/18  CONSULTS:  cardiology and  pulmonary/intensive care  Time spent:  25 minutes-Greater than 50% of this time was spent in counseling, explanation of diagnosis, planning of further management, and coordination of care.  MEDICATIONS: Anti-infectives    Start     Dose/Rate Route Frequency Ordered Stop   11/23/15 1030  cefTRIAXone (ROCEPHIN) 1 g in dextrose 5 % 50 mL IVPB     1 g 100 mL/hr over 30 Minutes Intravenous Every 24 hours 11/23/15 0926 11/29/15 1029   11/23/15 0900  piperacillin-tazobactam (ZOSYN) IVPB 3.375 g  Status:  Discontinued     3.375 g 12.5 mL/hr over 240 Minutes Intravenous Every 8 hours 11/23/15 0802 11/23/15 0926   11/23/15 0900  vancomycin (VANCOCIN) IVPB 750 mg/150 ml premix  Status:  Discontinued     750 mg 150 mL/hr over 60 Minutes Intravenous Every 24 hours 11/23/15 0804 11/23/15 0921   11/23/15 0800  vancomycin (VANCOCIN) IVPB 1000 mg/200 mL premix  Status:  Discontinued     1,000 mg 200 mL/hr over 60 Minutes Intravenous Every 48 hours Nov 28, 2015 1621 11/23/15 0804   11/22/15 1400  ceFEPIme (MAXIPIME) 1 g in dextrose 5 % 50 mL IVPB  Status:  Discontinued     1 g 100 mL/hr over 30 Minutes Intravenous Every 24 hours Nov 28, 2015 1621 11/23/15 0800   Nov 28, 2015 1115  ceFEPIme (MAXIPIME) 2 g in dextrose 5 % 50 mL IVPB     2 g 100 mL/hr over 30 Minutes Intravenous  Once 2015/11/28 1103 11/28/2015 1152   28-Nov-2015 1115  vancomycin (VANCOCIN) IVPB 1000 mg/200 mL premix     1,000 mg 200 mL/hr over 60 Minutes Intravenous  Once 11/28/2015 1103 28-Nov-2015 1257      Scheduled Meds: . albuterol  5 mg Nebulization Once  . amiodarone  200 mg Oral BID  . aspirin EC  81 mg Oral Daily  . budesonide (PULMICORT) nebulizer solution  0.5 mg Nebulization BID  . cefTRIAXone (ROCEPHIN)  IV  1 g Intravenous Q24H  . digoxin  0.125 mg Oral Daily  . diltiazem  30 mg Oral Q8H  . famotidine  20 mg Oral QHS  . feeding supplement (PRO-STAT SUGAR FREE 64)  30 mL Oral TID  . heparin  5,000 Units Subcutaneous Q8H  . insulin aspart   0-9 Units Subcutaneous Q4H  . insulin glargine  8 Units Subcutaneous Daily  . ipratropium-albuterol  3 mL Nebulization TID  . methylPREDNISolone (SOLU-MEDROL) injection  40 mg Intravenous Q12H  . metoprolol tartrate  25 mg Oral TID   Continuous Infusions:   PRN Meds:.sodium chloride, acetaminophen, acetaminophen, fentaNYL (SUBLIMAZE) injection, haloperidol lactate   PHYSICAL EXAM: Vital signs: Vitals:   11/27/15 1000 11/27/15 1100 11/27/15 1139 11/27/15 1200  BP:   (!) 180/80 (!) 175/90  Pulse: 65 (!) 57 (!) 59 63  Resp: (!) 22 12 (!) 21 (!) 23  Temp:   98.6 F (37 C)   TempSrc:   Oral   SpO2: 99% 99% 98% 100%  Weight:      Height:       Filed Weights   11/25/15 0110 11/26/15 0437 11/27/15 0455  Weight: 66.3 kg (146 lb 2.6 oz) 65 kg (143 lb 4.8 oz) 65.1 kg (143 lb 8.3 oz)   Body mass index is 43.8 kg/m.   General appearance :Awake,Pleasantly confused but not in any distress. Lying comfortably Eyes:, pupils equally reactive to light and accomodation,no scleral icterus.Pink conjunctiva HEENT: Atraumatic and Normocephalic Neck: supple, no JVD. No cervical lymphadenopathy. No thyromegaly Resp:Good air entry bilaterally, rhonchi scattered all over CVS:  S1 S2 irregular, no murmurs.  GI: Bowel sounds present, Non tender and not distended with no gaurding, rigidity or rebound.No organomegaly Extremities: B/L BKA-stump intact without any ulceration Neurology:  Moving all 4 extremities  Psychiatric: Pleasant and confused. Musculoskeletal:No digital cyanosis in upper extremities  I have personally reviewed following labs and imaging studies  LABORATORY DATA: CBC:  Recent Labs Lab 11/14/2015 1026 11/22/15 0345 11/23/15 0445 11/26/15 0246  WBC 8.3 6.9 10.5 9.9  NEUTROABS 6.9  --  9.9*  --   HGB 14.1 12.2* 12.6* 15.0  HCT 43.9 38.1* 38.9* 45.2  MCV 100.9* 98.2 97.7 96.0  PLT 152 122* 121* 125*    Basic Metabolic Panel:  Recent Labs Lab 11/22/15 0345 11/22/15 1030   11/22/15 1715 11/23/15 0445 11/23/15 1543 11/25/15 0316 11/26/15 0246  NA 142  --   < > 140 140 141 140 144  K 3.9  --   < > 3.5 3.6 4.1 4.4 3.9  CL 110  --   < > 111 110 111 109 112*  CO2 21*  --   < > 21* 22 23 21* 23  GLUCOSE 318*  --   < > 240* 221* 146* 119* 97  BUN 82*  --   < > 79* 78* 82* 62* 50*  CREATININE 3.99*  --   < > 2.87* 2.28* 2.03* 1.45* 1.26*  CALCIUM 7.4*  --   < > 7.7* 7.8* 7.9* 8.7* 8.7*  MG 2.0 2.1  --  2.0 2.0 1.9  --   --   PHOS 5.3* 4.4  --  3.9 3.9 4.3  --   --   < > = values in this interval not displayed.  GFR: Estimated Creatinine Clearance: 27.9 mL/min (by C-G formula based on SCr of 1.26 mg/dL (H)).  Liver Function Tests:  Recent Labs Lab 11/29/2015 1026 11/22/15 0345 11/22/15 1030 11/23/15 1323 11/26/15 0246  AST 3,426*  --  590* 144* 42*  ALT 2,108*  --  1,191* 765* 349*  ALKPHOS 110  --  84 73 70  BILITOT 1.1  --  0.8 0.6 0.9  PROT 7.4  --  5.5* 5.1* 5.3*  ALBUMIN 3.6 2.6* 2.4* 2.4* 2.6*    Recent Labs Lab 11/20/2015 1030  LIPASE 37   No results for input(s): AMMONIA in the last 168 hours.  Coagulation Profile: No results for input(s): INR, PROTIME in the last 168 hours.  Cardiac Enzymes:  Recent Labs Lab 11/22/15 0203 11/23/15 1323 11/24/15 0151 11/24/15 0655 11/24/15 1331  TROPONINI 0.59* 0.26* 0.19* 0.19* 0.15*    BNP (last 3 results) No results for input(s): PROBNP in the last 8760 hours.  HbA1C: No results for input(s): HGBA1C in the last 72 hours.  CBG:  Recent Labs Lab 11/26/15 1951 11/27/15 0008 11/27/15 0405 11/27/15 0746 11/27/15 1138  GLUCAP 204* 153* 75 124* 182*    Lipid Profile: No results for input(s): CHOL, HDL, LDLCALC, TRIG, CHOLHDL, LDLDIRECT in the last 72 hours.  Thyroid Function Tests: No results for input(s): TSH, T4TOTAL, FREET4, T3FREE, THYROIDAB in the last 72 hours.  Anemia Panel: No results for input(s): VITAMINB12, FOLATE, FERRITIN, TIBC, IRON, RETICCTPCT in the last 72  hours.  Urine analysis:    Component Value Date/Time   COLORURINE YELLOW 11/20/2015 2210   APPEARANCEUR CLOUDY (A) 11/20/2015 2210   LABSPEC 1.020 11/30/2015 2210   PHURINE 5.0 11/16/2015 2210   GLUCOSEU NEGATIVE 11/20/2015 2210   HGBUR NEGATIVE 11/22/2015 2210   BILIRUBINUR SMALL (A)  11/20/2015 2210   KETONESUR NEGATIVE 12/02/2015 2210   PROTEINUR 30 (A) 11/15/2015 2210   UROBILINOGEN 0.2 06/23/2011 1739   NITRITE NEGATIVE 12/02/2015 2210   LEUKOCYTESUR NEGATIVE 11/08/2015 2210    Sepsis Labs: Lactic Acid, Venous    Component Value Date/Time   LATICACIDVEN 2.01 (HH) 11/13/2015 1111    MICROBIOLOGY: Recent Results (from the past 240 hour(s))  Blood Culture (routine x 2)     Status: None   Collection Time: 11/17/2015 10:25 AM  Result Value Ref Range Status   Specimen Description BLOOD LEFT ARM  5 ML IN [facility] BOTTLE  Final   Special Requests NONE  Final   Culture   Final    NO GROWTH 5 DAYS Performed at [facility]    Report Status 11/26/2015 FINAL  Final  Blood Culture (routine x 2)     Status: None   Collection Time: 11/30/2015 11:10 AM  Result Value Ref Range Status   Specimen Description BLOOD RIGHT FOREARM  5 ML IN [facility] ONLY  Final   Special Requests NONE  Final   Culture   Final    NO GROWTH 5 DAYS Performed at [facility]    Report Status 11/26/2015 FINAL  Final  MRSA PCR Screening     Status: None   Collection Time: 11/08/2015  6:58 PM  Result Value Ref Range Status   MRSA by PCR NEGATIVE NEGATIVE Final    Comment:        The GeneXpert MRSA Assay (FDA approved for NASAL specimens only), is one component of a comprehensive MRSA colonization surveillance program. It is not intended to diagnose MRSA infection nor to guide or monitor treatment for MRSA infections.   Urine culture     Status: None   Collection Time: 11/18/2015 10:10 PM  Result Value Ref Range Status   Specimen Description URINE, RANDOM  Final   Special Requests NONE  Final    Culture NO GROWTH  Final   Report Status 11/23/2015 FINAL  Final  Culture, respiratory (NON-Expectorated)     Status: None   Collection Time: 11/22/15  9:37 AM  Result Value Ref Range Status   Specimen Description TRACHEAL ASPIRATE  Final   Special Requests NONE  Final   Gram Stain   Final    ABUNDANT WBC PRESENT, PREDOMINANTLY PMN RARE GRAM POSITIVE COCCI IN PAIRS    Culture Consistent with normal respiratory flora.  Final   Report Status 11/25/2015 FINAL  Final    RADIOLOGY STUDIES/RESULTS: US Renal  Result Date: 12/05/2015 CLINICAL DATA:  Renal failure, acute on chronic, hypertension, diabetes mellitus, hyperlipidemia, coronary artery disease post MI, CHF, COPD, paroxysmal atrial fibrillation EXAM: RENAL / URINARY TRACT ULTRASOUND COMPLETE COMPARISON:  Abdomen ultrasound 03/28/2013 FINDINGS: Right Kidney: Length: 11.6 cm. Normal cortical thickness. Increased cortical echogenicity. Several tiny RIGHT renal cysts. No solid mass, hydronephrosis or shadowing calcification. Left Kidney: Length: 10.3 cm. Suboptimal visualization due to body habitus and limited positioning (patient on ventilator) poorly visualized. Normal cortical thickness. No gross evidence of mass or hydronephrosis. Bladder: Poorly distended, with suboptimal assessment of wall thickness. Prostate enlargement with gland measuring 4.8 x 3.9 x 3.1 cm. IMPRESSION: Prostatic enlargement. Tiny RIGHT renal cyst. Question medical renal disease changes. Suboptimal visualization of LEFT kidney as above. Electronically Signed   By: Ulyses Southward M.D.   On: 12/01/2015 16:26   Dg Chest Port 1 View  Result Date: 11/27/2015 CLINICAL DATA:  Cough and congestion with shortness of Breath EXAM: PORTABLE  CHEST 1 VIEW COMPARISON:  11/24/2019 FINDINGS: Cardiac shadow is stable. Postoperative changes are again seen. Left pleural effusion is again identified and stable. Underlying basilar atelectasis is noted. Smaller right pleural effusion is seen.  Changes of prior left rib fractures are noted. IMPRESSION: Bilateral pleural effusions with atelectasis left greater than right. The overall appearance is stable from the prior exam. Electronically Signed   By: Alcide Clever M.D.   On: 11/27/2015 10:28   Dg Chest Port 1 View  Result Date: 11/24/2015 CLINICAL DATA:  Acute respiratory failure.  Shortness of breath EXAM: PORTABLE CHEST 1 VIEW COMPARISON:  11/23/2015 FINDINGS: Postoperative changes in the mediastinum. Interval removal of endotracheal tube, enteric tube, and central venous catheters. Normal heart size and pulmonary vascularity. Bilateral pleural effusions with basilar infiltration or atelectasis. Old left rib fractures. Calcified and tortuous aorta. No pneumothorax. Degenerative changes in the shoulders. IMPRESSION: Bilateral pleural effusions with basilar atelectasis or infiltration. Electronically Signed   By: Burman Nieves M.D.   On: 11/24/2015 02:24   Dg Chest Port 1 View  Result Date: 11/23/2015 CLINICAL DATA:  Status post intubation EXAM: PORTABLE CHEST 1 VIEW COMPARISON:  11/22/2015 FINDINGS: Cardiac shadow is stable. Endotracheal tube, nasogastric catheter and right jugular line are again seen and stable. Lungs are well aerated bilaterally. Increasing right basilar atelectasis is noted. Persistent left basilar infiltrate is noted. Old rib fractures are seen on the left. IMPRESSION: Stable left basilar infiltrate. Increasing right basilar atelectasis. Tubes and lines as described. Electronically Signed   By: Alcide Clever M.D.   On: 11/23/2015 07:55   Dg Chest Port 1 View  Result Date: 11/22/2015 CLINICAL DATA:  75 year old male with respiratory failure. EXAM: PORTABLE CHEST 1 VIEW COMPARISON:  12-20-15 and prior radiograph FINDINGS: An endotracheal tube with tip 4.8 cm above the carina, NG tube entering the stomach with tip off the field of view and right IJ central venous catheter with tip overlying the mid -lower SVC again noted.  Cardiomediastinal silhouette is unchanged. CABG changes again noted. Left lower lung airspace disease and small left pleural effusion again noted. There has been little interval change since the prior study. . IMPRESSION: No significant change. Continued left lower lung airspace disease and small left pleural effusion. Electronically Signed   By: Harmon Pier M.D.   On: 11/22/2015 09:02   Dg Chest Port 1 View  Result Date: December 20, 2015 CLINICAL DATA:  Intubated EXAM: PORTABLE CHEST 1 VIEW COMPARISON:  Chest radiograph from earlier today. FINDINGS: Endotracheal tube tip is 4.1 cm above the carina. Right internal jugular central venous catheter terminates in the lower third of the superior vena cava. Sternotomy wires appear aligned and intact. Stable cardiomediastinal silhouette with normal heart size and atherosclerotic thoracic aorta. No pneumothorax. Stable small left pleural effusion and patchy left lung base opacity. No pulmonary edema. IMPRESSION: 1. Well-positioned support structures as described. No pneumothorax. 2. Stable patchy left lung base opacity, which could represent aspiration, pneumonia or atelectasis. 3. Probable stable small left pleural effusion. Electronically Signed   By: Delbert Phenix M.D.   On: 12/20/2015 18:02   Dg Chest Portable 1 View  Result Date: 2015/12/20 CLINICAL DATA:  Productive cough x 2-3 days, worsening of SOB last night; hx CHF, COPD, HTN, dementia, smoker; pt was very unstable, confused, tech was not able to position pt for better AP view. EXAM: PORTABLE CHEST 1 VIEW COMPARISON:  06/23/2011 FINDINGS: Cardiac silhouette is normal in size. There stable changes from previous CABG surgery. No mediastinal  or hilar masses. There is hazy opacity in the left lung base. This a reflect atelectasis or pneumonia. Lungs otherwise clear. No convincing pleural effusion. No gross pneumothorax. The bony thorax is demineralized. There are old healed rib fractures on the left. IMPRESSION: 1.  Hazy left lung base opacity which may reflect pneumonia or atelectasis. No other evidence of acute cardiopulmonary disease. Electronically Signed   By: Amie Portland M.D.   On: 12/01/2015 10:46   Dg Swallowing Func-speech Pathology  Result Date: 11/25/2015 Objective Swallowing Evaluation: Type of Study: MBS-Modified Barium Swallow Study Patient Details Name: [name] MRN: [id number] Date of Birth: 28-Jul-1940 Today's Date: 11/25/2015 Time: SLP Start Time (ACUTE ONLY): 1200-SLP Stop Time (ACUTE ONLY): 1218 SLP Time Calculation (min) (ACUTE ONLY): 18 min Past Medical History: Past Medical History: Diagnosis Date . CHF (congestive heart failure) (HCC)   S/p CABG  . Chronic kidney disease   Baseline Cr appears to be 1.2-1.4  . COPD (chronic obstructive pulmonary disease) (HCC)  . Coronary artery disease  . CVA (cerebral infarction)   CT head 06/2011 with remote left frontal infarct, remote basal ganglia  lacunar infarcts bilaterally . Dementia  . Diabetes mellitus  . Hyperlipidemia  . Hypertension  . Myocardial infarction (HCC)  . Occlusion and stenosis of carotid artery without mention of cerebral infarction  . Paroxysmal a-fib (HCC)  . PVD (peripheral vascular disease) (HCC)   S/p bilateral BKA's  . Tobacco abuse   States 2 PPD currently  Past Surgical History: Past Surgical History: Procedure Laterality Date . Coronary artery bypass x3  01/04/2002 . Lt below-the-knee amputation  12/07/2009 . RT below-the-knee amputation  07/31/2008 HPI: 75 year old smoker ETOH abuse, COPD, CVA, dementia, bilateral amputee, CABG, HTN nursing home resident admitted for respiratory distress.Intubated 9/16-9/18. CXR Bilateral pleural effusions with basilar atelectasis or infiltration. No prior ST notes. No Data Recorded Assessment / Plan / Recommendation CHL IP CLINICAL IMPRESSIONS 11/25/2015 Therapy Diagnosis Mild pharyngeal phase dysphagia;Mild oral phase dysphagia;Moderate oral phase dysphagia Clinical Impression Mild-mod  oral and mild pharyngeal dysphagia due to lingual weakness/discoordination resulting in decreased manipulation and delayed propulsion due to holding boluses. Pharyngeal deficits due to sensory impairment leading to reduced tongue base retraction and decreased laryngeal elevation leaving mild vallecular and pyriform sinus residue. Verbal cues for second swallow reduced amount. Pt coughed prior to barium consumption and frequently throughout study without encroachment of po's into laryngeal vestibule. Increasing aspiration risk is pt's current confused state. Recommend Dys 1 texture and thin liquids, no straws, small sips, swallow twice, FULL supervision. ST will follow.   Impact on safety and function Moderate aspiration risk   CHL IP TREATMENT RECOMMENDATION 11/25/2015 Treatment Recommendations Therapy as outlined in treatment plan below   Prognosis 11/25/2015 Prognosis for Safe Diet Advancement (No Data) Barriers to Reach Goals Cognitive deficits Barriers/Prognosis Comment -- CHL IP DIET RECOMMENDATION 11/25/2015 SLP Diet Recommendations Dysphagia 1 (Puree) solids;Thin liquid Liquid Administration via Cup;No straw Medication Administration Crushed with puree Compensations Minimize environmental distractions;Slow rate;Small sips/bites;Multiple dry swallows after each bite/sip Postural Changes Seated upright at 90 degrees   CHL IP OTHER RECOMMENDATIONS 11/25/2015 Recommended Consults -- Oral Care Recommendations Oral care BID Other Recommendations --   CHL IP FOLLOW UP RECOMMENDATIONS 11/25/2015 Follow up Recommendations (No Data)   CHL IP FREQUENCY AND DURATION 11/25/2015 Speech Therapy Frequency (ACUTE ONLY) min 2x/week Treatment Duration 2 weeks      CHL IP ORAL PHASE 11/25/2015 Oral Phase Impaired Oral - Pudding Teaspoon -- Oral - Pudding Cup --  Oral - Honey Teaspoon -- Oral - Honey Cup -- Oral - Nectar Teaspoon Delayed oral transit Oral - Nectar Cup Delayed oral transit Oral - Nectar Straw -- Oral - Thin Teaspoon --  Oral - Thin Cup Delayed oral transit Oral - Thin Straw WFL Oral - Puree -- Oral - Mech Soft -- Oral - Regular Delayed oral transit;Weak lingual manipulation Oral - Multi-Consistency -- Oral - Pill -- Oral Phase - Comment --  CHL IP PHARYNGEAL PHASE 11/25/2015 Pharyngeal Phase Impaired Pharyngeal- Pudding Teaspoon -- Pharyngeal -- Pharyngeal- Pudding Cup -- Pharyngeal -- Pharyngeal- Honey Teaspoon -- Pharyngeal -- Pharyngeal- Honey Cup -- Pharyngeal -- Pharyngeal- Nectar Teaspoon WFL Pharyngeal -- Pharyngeal- Nectar Cup Pharyngeal residue - valleculae;Reduced tongue base retraction Pharyngeal -- Pharyngeal- Nectar Straw -- Pharyngeal -- Pharyngeal- Thin Teaspoon -- Pharyngeal -- Pharyngeal- Thin Cup Pharyngeal residue - valleculae;Pharyngeal residue - pyriform;Reduced laryngeal elevation;Reduced tongue base retraction Pharyngeal -- Pharyngeal- Thin Straw Pharyngeal residue - valleculae;Pharyngeal residue - pyriform;Reduced tongue base retraction;Reduced laryngeal elevation Pharyngeal -- Pharyngeal- Puree NT Pharyngeal -- Pharyngeal- Mechanical Soft -- Pharyngeal -- Pharyngeal- Regular Pharyngeal residue - valleculae;Reduced tongue base retraction Pharyngeal -- Pharyngeal- Multi-consistency -- Pharyngeal -- Pharyngeal- Pill -- Pharyngeal -- Pharyngeal Comment --  CHL IP CERVICAL ESOPHAGEAL PHASE 11/25/2015 Cervical Esophageal Phase WFL Pudding Teaspoon -- Pudding Cup -- Honey Teaspoon -- Honey Cup -- Nectar Teaspoon -- Nectar Cup -- Nectar Straw -- Thin Teaspoon -- Thin Cup -- Thin Straw -- Puree -- Mechanical Soft -- Regular -- Multi-consistency -- Pill -- Cervical Esophageal Comment -- No flowsheet data found. Royce Macadamia 11/25/2015, 2:23 PM Breck Coons Lonell Face.Ed CCC-SLP Pager [phone]                LOS: 6 days   Jeoffrey Massed, MD  [facility] Pager:[phone]  If 7PM-7AM, please contact night-coverage [email or web address] Password [email or web address] 11/27/2015, 1:21 PM

## 2015-11-27 NOTE — Progress Notes (Signed)
CSW following for placement. [facility] can take pt if there is a bed available at dc.  Osborne Cascoadia Greenleigh Kauth LCSWA [phone]

## 2015-11-27 NOTE — Progress Notes (Signed)
Pts. Safety mitts reapplied at 1600 after pulling stool from rectum, removing condom catheter X3 today and swatting at RN.

## 2015-11-27 NOTE — Progress Notes (Signed)
Subjective:  Confused; no chest pain  Objective:   Vital Signs : Vitals:   11/27/15 0623 11/27/15 0700 11/27/15 0748 11/27/15 0917  BP: (!) 167/61  (!) 175/66   Pulse:  (!) 57 (!) 58 64  Resp:  (!) 23 20   Temp:   98 F (36.7 C)   TempSrc:   Oral   SpO2:  98% 98%   Weight:      Height:        Intake/Output from previous day:  Intake/Output Summary (Last 24 hours) at 11/27/15 0919 Last data filed at 11/27/15 0400  Gross per 24 hour  Intake           1110.1 ml  Output             1075 ml  Net             35.1 ml    I/O since admission: +5492  Wt Readings from Last 3 Encounters:  11/27/15 143 lb 8.3 oz (65.1 kg)  06/23/11 154 lb 8.7 oz (70.1 kg)  07/07/09 162 lb (73.5 kg)    Medications: . albuterol  5 mg Nebulization Once  . amiodarone  200 mg Oral BID  . aspirin EC  81 mg Oral Daily  . budesonide (PULMICORT) nebulizer solution  0.5 mg Nebulization BID  . cefTRIAXone (ROCEPHIN)  IV  1 g Intravenous Q24H  . digoxin  0.125 mg Oral Daily  . diltiazem  30 mg Oral Q8H  . famotidine (PEPCID) IV  20 mg Intravenous Q24H  . feeding supplement (PRO-STAT SUGAR FREE 64)  30 mL Oral TID  . heparin  5,000 Units Subcutaneous Q8H  . insulin aspart  0-9 Units Subcutaneous Q4H  . insulin glargine  8 Units Subcutaneous Daily  . ipratropium-albuterol  3 mL Nebulization TID  . methylPREDNISolone (SOLU-MEDROL) injection  40 mg Intravenous Q12H  . metoprolol tartrate  25 mg Oral TID       Physical Exam:   General appearance: dishelved; no distress Neck: no adenopathy, no carotid bruit, no JVD, supple, symmetrical, trachea midline, thyroid not enlarged, symmetric, no tenderness/mass/nodules  Lungs: rhochi with faint wheezing Heart: irregularly irregular rhythm 1/6 systolic murmur Abdomen: soft, non-tender; bowel sounds normal; no masses,  no organomegaly Extremities: bilateral AKA Neurologic: confused; nonfocal   Rate: 93  Rhythm: atrial fibrillation  ECG  (independently read by me): AF at 142 from 9/18  Lab Results:   Recent Labs  11/25/15 0316 11/26/15 0246  NA 140 144  K 4.4 3.9  CL 109 112*  CO2 21* 23  GLUCOSE 119* 97  BUN 62* 50*  CREATININE 1.45* 1.26*  CALCIUM 8.7* 8.7*    Hepatic Function Latest Ref Rng & Units 11/26/2015 11/23/2015 11/22/2015  Total Protein 6.5 - 8.1 g/dL 5.3(L) 5.1(L) 5.5(L)  Albumin 3.5 - 5.0 g/dL 2.6(L) 2.4(L) 2.4(L)  AST 15 - 41 U/L 42(H) 144(H) 590(H)  ALT 17 - 63 U/L 349(H) 765(H) 1,191(H)  Alk Phosphatase 38 - 126 U/L 70 73 84  Total Bilirubin 0.3 - 1.2 mg/dL 0.9 0.6 0.8  Bilirubin, Direct 0.1 - 0.5 mg/dL - 0.3 0.4     Recent Labs  11/26/15 0246  WBC 9.9  HGB 15.0  HCT 45.2  MCV 96.0  PLT 125*     Recent Labs  11/24/15 1331  TROPONINI 0.15*    Lab Results  Component Value Date   TSH 0.457 11/24/2015   No results for input(s): HGBA1C in the last 72  hours.   Recent Labs  11/26/15 0246  PROT 5.3*  ALBUMIN 2.6*  AST 42*  ALT 349*  ALKPHOS 70  BILITOT 0.9   No results for input(s): INR in the last 72 hours. BNP (last 3 results) No results for input(s): BNP in the last 8760 hours.  ProBNP (last 3 results) No results for input(s): PROBNP in the last 8760 hours.   Lipid Panel     Component Value Date/Time   CHOL  12/03/2009 0142    83        ATP III CLASSIFICATION:  <200     mg/dL   Desirable  200-239  mg/dL   Borderline High  >=240    mg/dL   High          TRIG 50 12/03/2009 0142   HDL 34 (L) 12/03/2009 0142   CHOLHDL 2.4 12/03/2009 0142   VLDL 10 12/03/2009 0142   LDLCALC  12/03/2009 0142    39        Total Cholesterol/HDL:CHD Risk Coronary Heart Disease Risk Table                     Men   Women  1/2 Average Risk   3.4   3.3  Average Risk       5.0   4.4  2 X Average Risk   9.6   7.1  3 X Average Risk  23.4   11.0        Use the calculated Patient Ratio above and the CHD Risk Table to determine the patient's CHD Risk.        ATP III  CLASSIFICATION (LDL):  <100     mg/dL   Optimal  100-129  mg/dL   Near or Above                    Optimal  130-159  mg/dL   Borderline  160-189  mg/dL   High  >190     mg/dL   Very High      Imaging:  Dg Swallowing Func-speech Pathology  Result Date: 11/25/2015 Objective Swallowing Evaluation: Type of Study: MBS-Modified Barium Swallow Study Patient Details Name: [name] MRN: [id number] Date of Birth: [birth year] Today's Date: 11/25/2015 Time: SLP Start Time (ACUTE ONLY): 1200-SLP Stop Time (ACUTE ONLY): 1218 SLP Time Calculation (min) (ACUTE ONLY): 18 min Past Medical History: Past Medical History: Diagnosis Date . CHF (congestive heart failure) (HCC)   S/p CABG  . Chronic kidney disease   Baseline Cr appears to be 1.2-1.4  . COPD (chronic obstructive pulmonary disease) ([place])  . Coronary artery disease  . CVA (cerebral infarction)   CT head 06/2011 with remote left frontal infarct, remote basal ganglia  lacunar infarcts bilaterally . Dementia  . Diabetes mellitus  . Hyperlipidemia  . Hypertension  . Myocardial infarction ([place])  . Occlusion and stenosis of carotid artery without mention of cerebral infarction  . Paroxysmal a-fib ([place])  . PVD (peripheral vascular disease) ([place])   S/p bilateral BKA's  . Tobacco abuse   States 2 PPD currently  Past Surgical History: Past Surgical History: Procedure Laterality Date . Coronary artery bypass x3  01/04/2002 . Lt below-the-knee amputation  12/07/2009 . RT below-the-knee amputation  07/31/2008 HPI: 75 year old smoker ETOH abuse, COPD, CVA, dementia, bilateral amputee, CABG, HTN nursing home resident admitted for respiratory distress.Intubated 9/16-9/18. CXR Bilateral pleural effusions with basilar atelectasis or infiltration. No prior ST notes. No Data Recorded Assessment /  Plan / Recommendation CHL IP CLINICAL IMPRESSIONS 11/25/2015 Therapy Diagnosis Mild pharyngeal phase dysphagia;Mild oral phase dysphagia;Moderate oral phase dysphagia Clinical  Impression Mild-mod oral and mild pharyngeal dysphagia due to lingual weakness/discoordination resulting in decreased manipulation and delayed propulsion due to holding boluses. Pharyngeal deficits due to sensory impairment leading to reduced tongue base retraction and decreased laryngeal elevation leaving mild vallecular and pyriform sinus residue. Verbal cues for second swallow reduced amount. Pt coughed prior to barium consumption and frequently throughout study without encroachment of po's into laryngeal vestibule. Increasing aspiration risk is pt's current confused state. Recommend Dys 1 texture and thin liquids, no straws, small sips, swallow twice, FULL supervision. ST will follow.   Impact on safety and function Moderate aspiration risk   CHL IP TREATMENT RECOMMENDATION 11/25/2015 Treatment Recommendations Therapy as outlined in treatment plan below   Prognosis 11/25/2015 Prognosis for Safe Diet Advancement (No Data) Barriers to Reach Goals Cognitive deficits Barriers/Prognosis Comment -- CHL IP DIET RECOMMENDATION 11/25/2015 SLP Diet Recommendations Dysphagia 1 (Puree) solids;Thin liquid Liquid Administration via Cup;No straw Medication Administration Crushed with puree Compensations Minimize environmental distractions;Slow rate;Small sips/bites;Multiple dry swallows after each bite/sip Postural Changes Seated upright at 90 degrees   CHL IP OTHER RECOMMENDATIONS 11/25/2015 Recommended Consults -- Oral Care Recommendations Oral care BID Other Recommendations --   CHL IP FOLLOW UP RECOMMENDATIONS 11/25/2015 Follow up Recommendations (No Data)   CHL IP FREQUENCY AND DURATION 11/25/2015 Speech Therapy Frequency (ACUTE ONLY) min 2x/week Treatment Duration 2 weeks      CHL IP ORAL PHASE 11/25/2015 Oral Phase Impaired Oral - Pudding Teaspoon -- Oral - Pudding Cup -- Oral - Honey Teaspoon -- Oral - Honey Cup -- Oral - Nectar Teaspoon Delayed oral transit Oral - Nectar Cup Delayed oral transit Oral - Nectar Straw -- Oral -  Thin Teaspoon -- Oral - Thin Cup Delayed oral transit Oral - Thin Straw WFL Oral - Puree -- Oral - Mech Soft -- Oral - Regular Delayed oral transit;Weak lingual manipulation Oral - Multi-Consistency -- Oral - Pill -- Oral Phase - Comment --  CHL IP PHARYNGEAL PHASE 11/25/2015 Pharyngeal Phase Impaired Pharyngeal- Pudding Teaspoon -- Pharyngeal -- Pharyngeal- Pudding Cup -- Pharyngeal -- Pharyngeal- Honey Teaspoon -- Pharyngeal -- Pharyngeal- Honey Cup -- Pharyngeal -- Pharyngeal- Nectar Teaspoon WFL Pharyngeal -- Pharyngeal- Nectar Cup Pharyngeal residue - valleculae;Reduced tongue base retraction Pharyngeal -- Pharyngeal- Nectar Straw -- Pharyngeal -- Pharyngeal- Thin Teaspoon -- Pharyngeal -- Pharyngeal- Thin Cup Pharyngeal residue - valleculae;Pharyngeal residue - pyriform;Reduced laryngeal elevation;Reduced tongue base retraction Pharyngeal -- Pharyngeal- Thin Straw Pharyngeal residue - valleculae;Pharyngeal residue - pyriform;Reduced tongue base retraction;Reduced laryngeal elevation Pharyngeal -- Pharyngeal- Puree NT Pharyngeal -- Pharyngeal- Mechanical Soft -- Pharyngeal -- Pharyngeal- Regular Pharyngeal residue - valleculae;Reduced tongue base retraction Pharyngeal -- Pharyngeal- Multi-consistency -- Pharyngeal -- Pharyngeal- Pill -- Pharyngeal -- Pharyngeal Comment --  CHL IP CERVICAL ESOPHAGEAL PHASE 11/25/2015 Cervical Esophageal Phase WFL Pudding Teaspoon -- Pudding Cup -- Honey Teaspoon -- Honey Cup -- Nectar Teaspoon -- Nectar Cup -- Nectar Straw -- Thin Teaspoon -- Thin Cup -- Thin Straw -- Puree -- Mechanical Soft -- Regular -- Multi-consistency -- Pill -- Cervical Esophageal Comment -- No flowsheet data found. Houston Siren 11/25/2015, 2:23 PM Orbie Pyo Colvin Caroli.Ed CCC-SLP Pager [phone]                 Assessment/Plan:   Active Problems:   Acute respiratory failure ([place])   Healthcare-associated pneumonia   Renal failure   Sepsis ([place])  Renal failure (ARF), acute on chronic  (HCC)   Endotracheal tube present   Dyspnea   Malnutrition of moderate degree   Elevated liver function tests   Persistent atrial fibrillation (HCC)   CAD in native artery    PAF/Afib with RVR: He has converted to NSR with amiodarone now orally at 200 mg bid, cardizem at 30 mg q 8 hrs started yesterday and lopressor 25 mg q 8 hrs.  Just on ASA with fall history, dementia and ETOH.  Will consider switching to QD dosing tomorrow once pharmocologicaly steady state. For echo today.  May be able to dc dig if LV fxn adequate and rate controlled with other agents as above.   Elevated troponin: troponin peaked at 0.59--> 0.26--> 0.19--> 0.15. Likely demand ischemia in the setting of known CAD, acute respiratory failure and AKI. He has not complained of any chest pain. Not a great candidate for further ischemic with dementia, alcoholism and non compliance.  ASA 81 mg started yesterday. On sq heparin.   HTN: BP has been  elevated today at 295 -621 systolic cardizem started yesterday for BP and rate control  AKI: creat improved from 5.13--> 1.45  -->1.26 ( s/p B BKA so Cr alone not a good estimate of Cr Clearance).  Elevated LFTs:  Markedly elevated AST/ALT at 590/1191 on 9/17 but improved now 42/349; will need to monitor closely with amiodarone. Repeat LFT today.   Carotid artery stenosis: continue asa and statin when able to take PO  HLD: resume statin when taking POs  AAA: distal, measured 3 cm x 3 xcm in 2014  OK from cardiac standpoint to transfer to telemetry since converted to NSR>  Troy Sine, MD, [facility] 11/27/2015, 9:19 AM

## 2015-11-28 ENCOUNTER — Inpatient Hospital Stay (HOSPITAL_COMMUNITY): Payer: Medicare Other

## 2015-11-28 DIAGNOSIS — G40901 Epilepsy, unspecified, not intractable, with status epilepticus: Secondary | ICD-10-CM

## 2015-11-28 LAB — COMPREHENSIVE METABOLIC PANEL
ALT: 199 U/L — ABNORMAL HIGH (ref 17–63)
ANION GAP: 10 (ref 5–15)
AST: 21 U/L (ref 15–41)
Albumin: 2.7 g/dL — ABNORMAL LOW (ref 3.5–5.0)
Alkaline Phosphatase: 67 U/L (ref 38–126)
BILIRUBIN TOTAL: 1.6 mg/dL — AB (ref 0.3–1.2)
BUN: 37 mg/dL — AB (ref 6–20)
CO2: 33 mmol/L — ABNORMAL HIGH (ref 22–32)
Calcium: 8.9 mg/dL (ref 8.9–10.3)
Chloride: 104 mmol/L (ref 101–111)
Creatinine, Ser: 1.08 mg/dL (ref 0.61–1.24)
Glucose, Bld: 243 mg/dL — ABNORMAL HIGH (ref 65–99)
POTASSIUM: 3.4 mmol/L — AB (ref 3.5–5.1)
Sodium: 147 mmol/L — ABNORMAL HIGH (ref 135–145)
TOTAL PROTEIN: 5.3 g/dL — AB (ref 6.5–8.1)

## 2015-11-28 LAB — CBC
HEMATOCRIT: 43.5 % (ref 39.0–52.0)
Hemoglobin: 13.9 g/dL (ref 13.0–17.0)
MCH: 31.3 pg (ref 26.0–34.0)
MCHC: 32 g/dL (ref 30.0–36.0)
MCV: 98 fL (ref 78.0–100.0)
Platelets: 142 10*3/uL — ABNORMAL LOW (ref 150–400)
RBC: 4.44 MIL/uL (ref 4.22–5.81)
RDW: 14.2 % (ref 11.5–15.5)
WBC: 6.9 10*3/uL (ref 4.0–10.5)

## 2015-11-28 LAB — DIGOXIN LEVEL: Digoxin Level: 1.7 ng/mL (ref 0.8–2.0)

## 2015-11-28 LAB — GLUCOSE, CAPILLARY
GLUCOSE-CAPILLARY: 230 mg/dL — AB (ref 65–99)
GLUCOSE-CAPILLARY: 140 mg/dL — AB (ref 65–99)
GLUCOSE-CAPILLARY: 128 mg/dL — AB (ref 65–99)
GLUCOSE-CAPILLARY: 129 mg/dL — AB (ref 65–99)
Glucose-Capillary: 172 mg/dL — ABNORMAL HIGH (ref 65–99)
Glucose-Capillary: 178 mg/dL — ABNORMAL HIGH (ref 65–99)
Glucose-Capillary: 211 mg/dL — ABNORMAL HIGH (ref 65–99)

## 2015-11-28 MED ORDER — AMLODIPINE BESYLATE 5 MG PO TABS
5.0000 mg | ORAL_TABLET | Freq: Every day | ORAL | Status: DC
  Administered 2015-11-28: 5 mg via ORAL
  Filled 2015-11-28: qty 1

## 2015-11-28 MED ORDER — SODIUM CHLORIDE 0.9 % IV SOLN
1300.0000 mg | INTRAVENOUS | Status: AC
  Administered 2015-11-28: 1300 mg via INTRAVENOUS
  Filled 2015-11-28: qty 26

## 2015-11-28 MED ORDER — PHENYTOIN SODIUM 50 MG/ML IJ SOLN
100.0000 mg | Freq: Three times a day (TID) | INTRAMUSCULAR | Status: DC
  Administered 2015-11-28 – 2015-12-03 (×13): 100 mg via INTRAVENOUS
  Filled 2015-11-28 (×17): qty 2

## 2015-11-28 MED ORDER — LORAZEPAM 2 MG/ML IJ SOLN
INTRAMUSCULAR | Status: AC
  Administered 2015-11-28: 1 mg
  Filled 2015-11-28: qty 1

## 2015-11-28 MED ORDER — ENSURE ENLIVE PO LIQD: 237.0000 mL | Freq: Two times a day (BID) | ORAL | Status: DC

## 2015-11-28 MED ORDER — METOPROLOL TARTRATE 50 MG PO TABS: 50.0000 mg | ORAL_TABLET | Freq: Two times a day (BID) | ORAL | Status: DC

## 2015-11-28 MED ORDER — SODIUM CHLORIDE 0.9 % IV BOLUS (SEPSIS): 1000.0000 mL | Freq: Once | INTRAVENOUS | Status: DC

## 2015-11-28 MED ORDER — SODIUM CHLORIDE 0.9 % IV BOLUS (SEPSIS)
1000.0000 mL | Freq: Once | INTRAVENOUS | Status: AC
  Administered 2015-11-28: 1000 mL via INTRAVENOUS

## 2015-11-28 MED ORDER — LORAZEPAM 2 MG/ML IJ SOLN
INTRAMUSCULAR | Status: AC
  Administered 2015-11-28: 0.5 mg
  Filled 2015-11-28: qty 1

## 2015-11-28 MED ORDER — SODIUM CHLORIDE 0.9 % IV SOLN
1000.0000 mg | Freq: Two times a day (BID) | INTRAVENOUS | Status: DC
  Administered 2015-11-29 – 2015-12-02 (×8): 1000 mg via INTRAVENOUS
  Filled 2015-11-28 (×12): qty 10

## 2015-11-28 MED ORDER — SODIUM CHLORIDE 0.9 % IV SOLN
1000.0000 mg | Freq: Once | INTRAVENOUS | Status: AC
  Administered 2015-11-28: 1000 mg via INTRAVENOUS
  Filled 2015-11-28: qty 10

## 2015-11-28 MED ORDER — DONEPEZIL HCL 10 MG PO TABS: 10.0000 mg | ORAL_TABLET | Freq: Every day | ORAL | Status: DC

## 2015-11-28 MED ORDER — PHENYTOIN SODIUM 50 MG/ML IJ SOLN: 100.0000 mg | Freq: Three times a day (TID) | INTRAMUSCULAR | Status: DC

## 2015-11-28 NOTE — Progress Notes (Addendum)
PROGRESS NOTE  [name]:[id number] DOB: February 23, 1941 DOA: Dec 06, 2015 PCP: Londell Moh, MD   LOS: 7 days   Brief Narrative: Patient is a 75 y.o. male resident of a skilled nursing facility, history of COPD, CAD status post CABG (2003), PAD status post bilateral BKA, dementia, paroxysmal atrial fibrillation no longer on anticoagulation admitted to the intensive care unit on 9/16 with acute hypercarbic respiratory failure likely from acute exacerbation of COPD. Initially started on BiPAP, however required intubation post admission. He was managed with empiric antibiotics and other supportive measures, and subsequently extubated on 9/18. Hospital course has also been complicated by development of atrial fibrillation with RVR requiring IV digoxin and IV amiodarone infusion, transaminitis and acute renal failure. Patient was transferred to the [facility] on 9/20. See below for further details  Assessment & Plan: Active Problems:   Acute respiratory failure (HCC)   Healthcare-associated pneumonia   Renal failure   Sepsis (HCC)   Renal failure (ARF), acute on chronic (HCC)   Endotracheal tube present   Dyspnea   Malnutrition of moderate degree   Elevated liver function tests   Persistent atrial fibrillation (HCC)   CAD in native artery   PAF (paroxysmal atrial fibrillation) (HCC)   Seizure activity - paged by RN shortly after 3 pm that patient is actively seizing. Bedside evaluation shows patient with clenched fists, with repetitive movements c/w seizures. Ativan 0.5 mg x 2, Keppra stat. CT scan of the head. Consulted neurology, appreciate Dr. Amada Jupiter input. - called and discussed with family, talked with son [name] and daughter [name]. She made most recent medical decisions and confirms DNR for now stating that his quality of life is extremely poor. Discussed in detail that if his breathing status is getting worse he may not survive, she understands this  and if he is to get worse she would want him comfortable rather than sent back to the ICU.  Acute hypercarbic respiratory failure due to COPD exacerbation - Improved, extubated on 9/18. Secondary to COPD exacerbation. Taper Solu-Medrol, continue bronchodilators - start prednisone tomorrow   Severe sepsis secondary to Healthcare associated pneumonia  - Improving-sepsis pathophysiology has resolved, patient is now afebrile and without any leukocytosis. Initially on broad-spectrum antibiotics, now narrowed down to just ceftriaxone-stop date 9/24. Blood cultures negative so far.  Acute metabolic encephalopathy - Probably secondary to sepsis and hypercarbia, improved-suspect current mental status likely reflective of dementia.  Atrial fibrillation with RVR  - Initially required rate control with with IV amiodarone, IV digoxin and IV Lopressor. Patient is now back in sinus rhythm. Cardiology following and directing care. He is now on oral medications. Although he has a chads2vasc score of 6,he is a very poor long-term candidate for anticoagulation given alcohol use, dementia and fall risk. - continue Lopressor and Amiodarone. Discontinue Cardizem and digoxin per cardiology.  Mildly elevated troponin - Trend is flat and not consistent with ACS. Suspect more secondary to demand ischemia. Continue Aspirin  Acute on chronic renal disease stage III  - Acute renal failure secondary secondary to ATN. Creatinine improving with supportive care. Nephrology has signed off.  - Recommendations are not to restart ACEs/ARB on discharge  Dysphagia - Initially Nothing by mouth-seen by speech therapy follow-and underwent modified barium swallow on 9/20-started on a puree diet- Dr. Jerral Ralph discussed with daughter [name] and son [name] over the phone on 9/21-both of them are aware of the aspiration risks.   Transaminitis - likely secondary to sepsis or from EtOH/statin use. Acute  hepatitis serology negative.  LFT's downtrending. Avoid hepatotoxic medications.  Thrombocytopenia - Mild, suspect secondary to sepsis - improving  HTN - suboptimal control, continue Norvasc, Metoprolol - suspect crhonically elevated  Type 2 diabetes - CBGs elevated-as patient on steroids-continue 8 units of Lantus and SSI.  History of CAD status post CABG  - continue Aspirin - Statins on hold due to transaminitis.  History of PAD status post bilateral BKA  AAA  - distal, measured 3 cm x 3 xcm in 2014  Dementia with mild delirium  - Resume Aricept today  - haldol prn  GERD: Continue PPI  Malnutrition of moderate degree   DVT prophylaxis: heparin Code Status: DNR Family Communication: no family bedside Disposition Plan: SNF 1-2 days   Consultants:   Cardiology   PCCM  Procedures:   ETT 9/16 >> 9/18  Right IJ 9/16 >> 9/18  Antimicrobials:  Vancomycin / Cefepime 9/16 >> 9/18  Ceftriaxone 9/18 >>  Plan to stop 9/24  Subjective: - alert, confused.   Objective: Vitals:   11/27/15 2139 11/28/15 0447 11/28/15 0940 11/28/15 0950  BP:  (!) 174/83 (!) 170/62   Pulse:  (!) 59 (!) 57   Resp:  19 18   Temp:  98.2 F (36.8 C) 98.5 F (36.9 C)   TempSrc:  Oral Oral   SpO2: 95% 94% 95% 90%  Weight:      Height:        Intake/Output Summary (Last 24 hours) at 11/28/15 1034 Last data filed at 11/28/15 0930  Gross per 24 hour  Intake           253.17 ml  Output             2245 ml  Net         -1991.83 ml   Filed Weights   11/26/15 0437 11/27/15 0455 11/27/15 2011  Weight: 65 kg (143 lb 4.8 oz) 65.1 kg (143 lb 8.3 oz) 65.4 kg (144 lb 2.9 oz)    Examination: Constitutional: NAD Vitals:   11/27/15 2139 11/28/15 0447 11/28/15 0940 11/28/15 0950  BP:  (!) 174/83 (!) 170/62   Pulse:  (!) 59 (!) 57   Resp:  19 18   Temp:  98.2 F (36.8 C) 98.5 F (36.9 C)   TempSrc:  Oral Oral   SpO2: 95% 94% 95% 90%  Weight:      Height:       Eyes: PERRL ENMT: Mucous membranes are  moist. Neck: normal, supple Respiratory: clear to auscultation bilaterally, no wheezing, no crackles. Normal respiratory effort. No accessory muscle use.  Cardiovascular: Regular rate and rhythm, no murmurs / rubs / gallops. No LE edema. 2+ pedal pulses. No JVD Abdomen: no tenderness. Bowel sounds positive.  Musculoskeletal: no clubbing / cyanosis. Bilateral BKA Neurologic: non focal   Data Reviewed: I have personally reviewed following labs and imaging studies  CBC:  Recent Labs Lab 11/22/15 0345 11/23/15 0445 11/26/15 0246 11/28/15 0343  WBC 6.9 10.5 9.9 6.9  NEUTROABS  --  9.9*  --   --   HGB 12.2* 12.6* 15.0 13.9  HCT 38.1* 38.9* 45.2 43.5  MCV 98.2 97.7 96.0 98.0  PLT 122* 121* 125* 142*   Basic Metabolic Panel:  Recent Labs Lab 11/22/15 0345 11/22/15 1030  11/22/15 1715 11/23/15 0445 11/23/15 1543 11/25/15 0316 11/26/15 0246 11/27/15 1202 11/28/15 0343  NA 142  --   < > 140 140 141 140 144 147* 147*  K 3.9  --   < >  3.5 3.6 4.1 4.4 3.9 3.9 3.4*  CL 110  --   < > 111 110 111 109 112* 111 104  CO2 21*  --   < > 21* 22 23 21* 23 28 33*  GLUCOSE 318*  --   < > 240* 221* 146* 119* 97 193* 243*  BUN 82*  --   < > 79* 78* 82* 62* 50* 42* 37*  CREATININE 3.99*  --   < > 2.87* 2.28* 2.03* 1.45* 1.26* 1.17 1.08  CALCIUM 7.4*  --   < > 7.7* 7.8* 7.9* 8.7* 8.7* 8.7* 8.9  MG 2.0 2.1  --  2.0 2.0 1.9  --   --   --   --   PHOS 5.3* 4.4  --  3.9 3.9 4.3  --   --   --   --   < > = values in this interval not displayed. GFR: Estimated Creatinine Clearance: 32.6 mL/min (by C-G formula based on SCr of 1.08 mg/dL). Liver Function Tests:  Recent Labs Lab 11/22/15 1030 11/23/15 1323 11/26/15 0246 11/27/15 1202 11/28/15 0343  AST 590* 144* 42* 20 21  ALT 1,191* 765* 349* 232* 199*  ALKPHOS 84 73 70 64 67  BILITOT 0.8 0.6 0.9 1.1 1.6*  PROT 5.5* 5.1* 5.3* 5.2* 5.3*  ALBUMIN 2.4* 2.4* 2.6* 2.7* 2.7*   No results for input(s): LIPASE, AMYLASE in the last 168 hours. No  results for input(s): AMMONIA in the last 168 hours. Coagulation Profile: No results for input(s): INR, PROTIME in the last 168 hours. Cardiac Enzymes:  Recent Labs Lab 11/22/15 0203 11/23/15 1323 11/24/15 0151 11/24/15 0655 11/24/15 1331  TROPONINI 0.59* 0.26* 0.19* 0.19* 0.15*   BNP (last 3 results) No results for input(s): PROBNP in the last 8760 hours. HbA1C: No results for input(s): HGBA1C in the last 72 hours. CBG:  Recent Labs Lab 11/27/15 1623 11/27/15 2009 11/28/15 0003 11/28/15 0440 11/28/15 0747  GLUCAP 170* 183* 211* 230* 140*   Lipid Profile: No results for input(s): CHOL, HDL, LDLCALC, TRIG, CHOLHDL, LDLDIRECT in the last 72 hours. Thyroid Function Tests: No results for input(s): TSH, T4TOTAL, FREET4, T3FREE, THYROIDAB in the last 72 hours. Anemia Panel: No results for input(s): VITAMINB12, FOLATE, FERRITIN, TIBC, IRON, RETICCTPCT in the last 72 hours. Urine analysis:    Component Value Date/Time   COLORURINE YELLOW 2015/11/24 2210   APPEARANCEUR CLOUDY (A) 2015-11-24 2210   LABSPEC 1.020 November 24, 2015 2210   PHURINE 5.0 2015/11/24 2210   GLUCOSEU NEGATIVE 11/24/15 2210   HGBUR NEGATIVE 11/24/2015 2210   BILIRUBINUR SMALL (A) 2015/11/24 2210   KETONESUR NEGATIVE 11-24-15 2210   PROTEINUR 30 (A) 11/24/15 2210   UROBILINOGEN 0.2 06/23/2011 1739   NITRITE NEGATIVE 2015/11/24 2210   LEUKOCYTESUR NEGATIVE November 24, 2015 2210   Sepsis Labs: Invalid input(s): PROCALCITONIN, LACTICIDVEN  Recent Results (from the past 240 hour(s))  Blood Culture (routine x 2)     Status: None   Collection Time: 11/24/15 10:25 AM  Result Value Ref Range Status   Specimen Description BLOOD LEFT ARM  5 ML IN [facility] BOTTLE  Final   Special Requests NONE  Final   Culture   Final    NO GROWTH 5 DAYS Performed at [facility]    Report Status 11/26/2015 FINAL  Final  Blood Culture (routine x 2)     Status: None   Collection Time: 11/24/2015 11:10 AM  Result Value Ref  Range Status   Specimen Description BLOOD RIGHT FOREARM  5 ML IN AEROBIC ONLY  Final   Special Requests NONE  Final   Culture   Final    NO GROWTH 5 DAYS Performed at [facility]    Report Status 11/26/2015 FINAL  Final  MRSA PCR Screening     Status: None   Collection Time: 2015/12/12  6:58 PM  Result Value Ref Range Status   MRSA by PCR NEGATIVE NEGATIVE Final    Comment:        The GeneXpert MRSA Assay (FDA approved for NASAL specimens only), is one component of a comprehensive MRSA colonization surveillance program. It is not intended to diagnose MRSA infection nor to guide or monitor treatment for MRSA infections.   Urine culture     Status: None   Collection Time: December 12, 2015 10:10 PM  Result Value Ref Range Status   Specimen Description URINE, RANDOM  Final   Special Requests NONE  Final   Culture NO GROWTH  Final   Report Status 11/23/2015 FINAL  Final  Culture, respiratory (NON-Expectorated)     Status: None   Collection Time: 11/22/15  9:37 AM  Result Value Ref Range Status   Specimen Description TRACHEAL ASPIRATE  Final   Special Requests NONE  Final   Gram Stain   Final    ABUNDANT WBC PRESENT, PREDOMINANTLY PMN RARE GRAM POSITIVE COCCI IN PAIRS    Culture Consistent with normal respiratory flora.  Final   Report Status 11/25/2015 FINAL  Final      Radiology Studies: Dg Chest Port 1 View  Result Date: 11/27/2015 CLINICAL DATA:  Cough and congestion with shortness of Breath EXAM: PORTABLE CHEST 1 VIEW COMPARISON:  11/24/2019 FINDINGS: Cardiac shadow is stable. Postoperative changes are again seen. Left pleural effusion is again identified and stable. Underlying basilar atelectasis is noted. Smaller right pleural effusion is seen. Changes of prior left rib fractures are noted. IMPRESSION: Bilateral pleural effusions with atelectasis left greater than right. The overall appearance is stable from the prior exam. Electronically Signed   By: Alcide Clever M.D.    On: 11/27/2015 10:28     Scheduled Meds: . albuterol  5 mg Nebulization Once  . amiodarone  200 mg Oral BID  . amLODipine  5 mg Oral Daily  . aspirin EC  81 mg Oral Daily  . budesonide (PULMICORT) nebulizer solution  0.5 mg Nebulization BID  . cefTRIAXone (ROCEPHIN)  IV  1 g Intravenous Q24H  . famotidine  20 mg Oral QHS  . feeding supplement (PRO-STAT SUGAR FREE 64)  30 mL Oral TID  . heparin  5,000 Units Subcutaneous Q8H  . insulin aspart  0-9 Units Subcutaneous Q4H  . insulin glargine  8 Units Subcutaneous Daily  . ipratropium-albuterol  3 mL Nebulization TID  . methylPREDNISolone (SOLU-MEDROL) injection  40 mg Intravenous Q12H  . metoprolol tartrate  50 mg Oral BID   Continuous Infusions:    Pamella Pert, MD, PhD [facility] Pager [phone]  If 7PM-7AM, please contact night-coverage [email or web address] Password [email or web address] 11/28/2015, 10:34 AM

## 2015-11-28 NOTE — Progress Notes (Signed)
Transported with patient to MRI. Dondra SpryMoore, Brycen Bean Islee, RN

## 2015-11-28 NOTE — Significant Event (Signed)
Rapid Response Event Note  Overview:  Called STAT by RN for patient with seizure like activity Time Called: 1506 Arrival Time: 1510 Event Type: Neurologic  Initial Focused Assessment: Called by RN for patient with seizure like activity.  Upon my arrival to patients room, RN at bedside.  Patient is lying in bed, on nasal cannula.  Patient is not responsive to painful stimuli, eyes are deviated right, right side of body arm and leg with repetitive twitching and then went to his face where developed twitching of face and head turned toward right side.  As per RN first noticed twitching activity at 1430.  Seizure activity continues, appears to have stopped around 1620   Interventions:  MD paged and at bedside.  Sats dropped to 75% on nasal cannula, placed on NRB mask sats up tp 91% 0.5 mg ativan given, Dr. Amada JupiterKirkpatrick at bedside another 1 mg ativan given. Keppra given stat, patient transported to CT scan, fosphenytoin given stat @1558 , VS 140/57, HR 61. Sat 91% on NRB mask RR 12, BP rechecked 122/46, HR 52 @1601 , BP rechecked 105/44, HR 51, 91% RR 10 ! 1615, Rechecked at 1624 BP 76/35, HR 44, 90%.  Dr. Lafe GarinGherge notified NS bolus started.    Plan of Care (if not transferred):  Family notified by MD and arrived to bedside  Event Summary:  RN to monitor patient and call if assistance needed   at      at          [facility]fe, Maryagnes Amosenise Ann

## 2015-11-28 NOTE — Progress Notes (Signed)
Patient having jerking and twitching uncontrollably.  MD notified @ 15:03.  Rapid response called.  CBG checked.  NRB mask applied.  Patient continued to have uncontrollable jerking and shaking.  Hospitalist and Neurology arrived.  Stat head CT ordered.  2 doses of Ativan given.  Transported patient to CT once minibag of seizure medication started.  Transported patient back to room.  Minibag of 2nd IV seizure medication started.  MD spoke to patient's son and daughter.  Patient's B/P dropped to 76/35.  Order received from MD to give 1L bolus.  Will continue to monitor patient.   Peri MarisAndrew Jalyne Brodzinski, MBA, BSN, RN

## 2015-11-28 NOTE — Consult Note (Signed)
Neurology Consultation Reason for Consult: Status epilepticus Referring Physician: Elvera LennoxGherghe, C  CC: Status epilepticus  History is obtained from: Chart, nursing, referring provider  HPI: [name] is a 75 y.o. male admitted with respiratory failure and was extubated on 9/18, gradually improving but then began having seizures earlier today. Approximately 2:30 PM, he began having right-sided arm leg and facial twitching. He was given 0.5 of Ativan without improvement, though he subsequently began having clear ictal activity once again.  On my arrival, he was having right arm and leg clonic activity. Gave an additional 1 mg IV Ativan, though more was not given due to concerns for respiratory status. He was loaded with IV Keppra and taken for a stat head CT which demonstrates his old infarct but no clear new findings  He continued to have focal seizure and therefore IV fosphenytoin was loaded with subsequent cessation of seizure activity and the beginnings of improvement in his exam.    ROS:  Unable to obtain due to altered mental status.   Past Medical History:  Diagnosis Date  . CHF (congestive heart failure) (HCC)    S/p CABG   . Chronic kidney disease    Baseline Cr appears to be 1.2-1.4   . COPD (chronic obstructive pulmonary disease) (HCC)   . Coronary artery disease   . CVA (cerebral infarction)    CT head 06/2011 with remote left frontal infarct, remote basal ganglia  lacunar infarcts bilaterally  . Dementia   . Diabetes mellitus   . Hyperlipidemia   . Hypertension   . Myocardial infarction (HCC)   . Occlusion and stenosis of carotid artery without mention of cerebral infarction   . Paroxysmal a-fib (HCC)   . PVD (peripheral vascular disease) (HCC)    S/p bilateral BKA's   . Tobacco abuse    States 2 PPD currently      Family History  Problem Relation Age of Onset  . Stroke Mother      Social History:  reports that he has been smoking Cigarettes.  He has a  110.00 pack-year smoking history. He has never used smokeless tobacco. He reports that he does not drink alcohol or use drugs.   Exam: Current vital signs: BP (!) 103/33 (BP Location: Right Arm)   Pulse (!) 40   Temp 98.5 F (36.9 C) (Oral)   Resp 18   Ht 4' (1.219 m)   Wt 65.4 kg (144 lb 2.9 oz)   SpO2 93%   BMI 44.00 kg/m  Vital signs in last 24 hours: Temp:  [98.2 F (36.8 C)-98.5 F (36.9 C)] 98.5 F (36.9 C) (09/23 0940) Pulse Rate:  [40-64] 40 (09/23 1713) Resp:  [18-20] 18 (09/23 0940) BP: (76-174)/(33-83) 103/33 (09/23 1713) SpO2:  [84 %-99 %] 93 % (09/23 1630) Weight:  [65.4 kg (144 lb 2.9 oz)] 65.4 kg (144 lb 2.9 oz) (09/22 2011)   Physical Exam  Constitutional: Appears Elderly Psych: Unresponsive Eyes: No scleral injection HENT: No OP obstrucion Head: Normocephalic.  Cardiovascular: Normal rate and regular rhythm.  Respiratory: Effort normal and breath sounds normal to anterior ascultation GI: Soft.  No distension. There is no tenderness.  Skin: WDI  Neuro: Mental Status: Does not open eyes, does not follow commands Cranial Nerves: II: Does not blink to threat Pupils are equal, round, and reactive to light.   III,IV, VI: Right gaze deviation V:VII: Blinks to eyelid stimulation bilaterally VIII, X, XI, XII: Unable to assess secondary to patient's altered mental status.  Motor: He has quasi-purposeful movements on the left, persistent clonic activity on the right Sensory: He does not respond to noxious stimuli on the right, he withdraws on the left Cerebellar: Does not perform  Following treatment with Ativan, Keppra, fosphenytoin he begins improving and withdrawals to noxious stimuli in both the right and left arms and no longer has a gaze deviation. There is no longer any clonic activity.    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 He has stopped seizing at this point, and has shown some small improvements in his exam. Given his tenuous respiratory status and that that he is DO NOT RESUSCITATE, I favor continuing his Dilantin and Keppra.   Recommendations: 1) Keppra 1 g twice a day 2) Dilantin 100 mg 3 times a day 3) MRI brain 4) neurology will continue to follow   Ritta Slot, MD [facility] [phone]  If 7pm- 7am, please page neurology on call as listed in AMION.

## 2015-11-28 NOTE — Progress Notes (Signed)
SUBJECTIVE:  No complaints  OBJECTIVE:   Vitals:   Vitals:   11/27/15 2139 11/28/15 0447 11/28/15 0940 11/28/15 0950  BP:  (!) 174/83 (!) 170/62   Pulse:  (!) 59 (!) 57   Resp:  19 18   Temp:  98.2 F (36.8 C) 98.5 F (36.9 C)   TempSrc:  Oral Oral   SpO2: 95% 94% 95% 90%  Weight:      Height:       I&O's:   Intake/Output Summary (Last 24 hours) at 11/28/15 1610 Last data filed at 11/28/15 9604  Gross per 24 hour  Intake           163.17 ml  Output             1945 ml  Net         -1781.83 ml   TELEMETRY: Reviewed telemetry pt in NSR:     PHYSICAL EXAM General: Well developed, well nourished, in no acute distress Head: Eyes PERRLA, No xanthomas.   Normal cephalic and atramatic  Lungs:   Clear bilaterally to auscultation and percussion. Heart:   HRRR S1 S2 Pulses are 2+ & equal. Abdomen: Bowel sounds are positive, abdomen soft and non-tender without masses  Msk:  Back normal, normal gait. Normal strength and tone for age. Extremities:   No clubbing, cyanosis or edema.  DP +1 Neuro: Alert and oriented X 3. Psych:  Good affect, responds appropriately   LABS: Basic Metabolic Panel:  Recent Labs  54/09/81 1202 11/28/15 0343  NA 147* 147*  K 3.9 3.4*  CL 111 104  CO2 28 33*  GLUCOSE 193* 243*  BUN 42* 37*  CREATININE 1.17 1.08  CALCIUM 8.7* 8.9   Liver Function Tests:  Recent Labs  11/27/15 1202 11/28/15 0343  AST 20 21  ALT 232* 199*  ALKPHOS 64 67  BILITOT 1.1 1.6*  PROT 5.2* 5.3*  ALBUMIN 2.7* 2.7*   No results for input(s): LIPASE, AMYLASE in the last 72 hours. CBC:  Recent Labs  11/26/15 0246 11/28/15 0343  WBC 9.9 6.9  HGB 15.0 13.9  HCT 45.2 43.5  MCV 96.0 98.0  PLT 125* 142*   Cardiac Enzymes: No results for input(s): CKTOTAL, CKMB, CKMBINDEX, TROPONINI in the last 72 hours. BNP: Invalid input(s): POCBNP D-Dimer: No results for input(s): DDIMER in the last 72 hours. Hemoglobin A1C: No results for input(s): HGBA1C in the  last 72 hours. Fasting Lipid Panel: No results for input(s): CHOL, HDL, LDLCALC, TRIG, CHOLHDL, LDLDIRECT in the last 72 hours. Thyroid Function Tests: No results for input(s): TSH, T4TOTAL, T3FREE, THYROIDAB in the last 72 hours.  Invalid input(s): FREET3 Anemia Panel: No results for input(s): VITAMINB12, FOLATE, FERRITIN, TIBC, IRON, RETICCTPCT in the last 72 hours. Coag Panel:   Lab Results  Component Value Date   INR 1.10 09/14/2010   INR 1.3 07/31/2008   INR 1.2 07/29/2008    RADIOLOGY: US Renal  Result Date: 12/01/2015 CLINICAL DATA:  Renal failure, acute on chronic, hypertension, diabetes mellitus, hyperlipidemia, coronary artery disease post MI, CHF, COPD, paroxysmal atrial fibrillation EXAM: RENAL / URINARY TRACT ULTRASOUND COMPLETE COMPARISON:  Abdomen ultrasound 03/28/2013 FINDINGS: Right Kidney: Length: 11.6 cm. Normal cortical thickness. Increased cortical echogenicity. Several tiny RIGHT renal cysts. No solid mass, hydronephrosis or shadowing calcification. Left Kidney: Length: 10.3 cm. Suboptimal visualization due to body habitus and limited positioning (patient on ventilator) poorly visualized. Normal cortical thickness. No gross evidence of mass or hydronephrosis. Bladder: Poorly distended, with  suboptimal assessment of wall thickness. Prostate enlargement with gland measuring 4.8 x 3.9 x 3.1 cm. IMPRESSION: Prostatic enlargement. Tiny RIGHT renal cyst. Question medical renal disease changes. Suboptimal visualization of LEFT kidney as above. Electronically Signed   By: Ulyses SouthwardMark  Boles M.D.   On: 2015/03/15 16:26   Dg Chest Port 1 View  Result Date: 11/27/2015 CLINICAL DATA:  Cough and congestion with shortness of Breath EXAM: PORTABLE CHEST 1 VIEW COMPARISON:  11/24/2019 FINDINGS: Cardiac shadow is stable. Postoperative changes are again seen. Left pleural effusion is again identified and stable. Underlying basilar atelectasis is noted. Smaller right pleural effusion is seen.  Changes of prior left rib fractures are noted. IMPRESSION: Bilateral pleural effusions with atelectasis left greater than right. The overall appearance is stable from the prior exam. Electronically Signed   By: Alcide CleverMark  Lukens M.D.   On: 11/27/2015 10:28   Dg Chest Port 1 View  Result Date: 11/24/2015 CLINICAL DATA:  Acute respiratory failure.  Shortness of breath EXAM: PORTABLE CHEST 1 VIEW COMPARISON:  11/23/2015 FINDINGS: Postoperative changes in the mediastinum. Interval removal of endotracheal tube, enteric tube, and central venous catheters. Normal heart size and pulmonary vascularity. Bilateral pleural effusions with basilar infiltration or atelectasis. Old left rib fractures. Calcified and tortuous aorta. No pneumothorax. Degenerative changes in the shoulders. IMPRESSION: Bilateral pleural effusions with basilar atelectasis or infiltration. Electronically Signed   By: Burman NievesWilliam  Stevens M.D.   On: 11/24/2015 02:24   Dg Chest Port 1 View  Result Date: 11/23/2015 CLINICAL DATA:  Status post intubation EXAM: PORTABLE CHEST 1 VIEW COMPARISON:  11/22/2015 FINDINGS: Cardiac shadow is stable. Endotracheal tube, nasogastric catheter and right jugular line are again seen and stable. Lungs are well aerated bilaterally. Increasing right basilar atelectasis is noted. Persistent left basilar infiltrate is noted. Old rib fractures are seen on the left. IMPRESSION: Stable left basilar infiltrate. Increasing right basilar atelectasis. Tubes and lines as described. Electronically Signed   By: Alcide CleverMark  Lukens M.D.   On: 11/23/2015 07:55   Dg Chest Port 1 View  Result Date: 11/22/2015 CLINICAL DATA:  75 year old male with respiratory failure. EXAM: PORTABLE CHEST 1 VIEW COMPARISON:  2015/03/15 and prior radiograph FINDINGS: An endotracheal tube with tip 4.8 cm above the carina, NG tube entering the stomach with tip off the field of view and right IJ central venous catheter with tip overlying the mid -lower SVC again noted.  Cardiomediastinal silhouette is unchanged. CABG changes again noted. Left lower lung airspace disease and small left pleural effusion again noted. There has been little interval change since the prior study. . IMPRESSION: No significant change. Continued left lower lung airspace disease and small left pleural effusion. Electronically Signed   By: Harmon PierJeffrey  Hu M.D.   On: 11/22/2015 09:02   Dg Chest Port 1 View  Result Date: 2015-04-07 CLINICAL DATA:  Intubated EXAM: PORTABLE CHEST 1 VIEW COMPARISON:  Chest radiograph from earlier today. FINDINGS: Endotracheal tube tip is 4.1 cm above the carina. Right internal jugular central venous catheter terminates in the lower third of the superior vena cava. Sternotomy wires appear aligned and intact. Stable cardiomediastinal silhouette with normal heart size and atherosclerotic thoracic aorta. No pneumothorax. Stable small left pleural effusion and patchy left lung base opacity. No pulmonary edema. IMPRESSION: 1. Well-positioned support structures as described. No pneumothorax. 2. Stable patchy left lung base opacity, which could represent aspiration, pneumonia or atelectasis. 3. Probable stable small left pleural effusion. Electronically Signed   By: Delbert PhenixJason A Poff M.D.   On:  11/25/2015 18:02   Dg Chest Portable 1 View  Result Date: 11/08/2015 CLINICAL DATA:  Productive cough x 2-3 days, worsening of SOB last night; hx CHF, COPD, HTN, dementia, smoker; pt was very unstable, confused, tech was not able to position pt for better AP view. EXAM: PORTABLE CHEST 1 VIEW COMPARISON:  06/23/2011 FINDINGS: Cardiac silhouette is normal in size. There stable changes from previous CABG surgery. No mediastinal or hilar masses. There is hazy opacity in the left lung base. This a reflect atelectasis or pneumonia. Lungs otherwise clear. No convincing pleural effusion. No gross pneumothorax. The bony thorax is demineralized. There are old healed rib fractures on the left. IMPRESSION: 1.  Hazy left lung base opacity which may reflect pneumonia or atelectasis. No other evidence of acute cardiopulmonary disease. Electronically Signed   By: Amie Portland M.D.   On: 11/17/2015 10:46   Dg Swallowing Func-speech Pathology  Result Date: 11/25/2015 Objective Swallowing Evaluation: Type of Study: MBS-Modified Barium Swallow Study Patient Details Name: CHANCEY RINGEL MRN: [id number] Date of Birth: 1940-11-26 Today's Date: 11/25/2015 Time: SLP Start Time (ACUTE ONLY): 1200-SLP Stop Time (ACUTE ONLY): 1218 SLP Time Calculation (min) (ACUTE ONLY): 18 min Past Medical History: Past Medical History: Diagnosis Date . CHF (congestive heart failure) (HCC)   S/p CABG  . Chronic kidney disease   Baseline Cr appears to be 1.2-1.4  . COPD (chronic obstructive pulmonary disease) (HCC)  . Coronary artery disease  . CVA (cerebral infarction)   CT head 06/2011 with remote left frontal infarct, remote basal ganglia  lacunar infarcts bilaterally . Dementia  . Diabetes mellitus  . Hyperlipidemia  . Hypertension  . Myocardial infarction (HCC)  . Occlusion and stenosis of carotid artery without mention of cerebral infarction  . Paroxysmal a-fib (HCC)  . PVD (peripheral vascular disease) (HCC)   S/p bilateral BKA's  . Tobacco abuse   States 2 PPD currently  Past Surgical History: Past Surgical History: Procedure Laterality Date . Coronary artery bypass x3  01/04/2002 . Lt below-the-knee amputation  12/07/2009 . RT below-the-knee amputation  07/31/2008 HPI: 75 year old smoker ETOH abuse, COPD, CVA, dementia, bilateral amputee, CABG, HTN nursing home resident admitted for respiratory distress.Intubated 9/16-9/18. CXR Bilateral pleural effusions with basilar atelectasis or infiltration. No prior ST notes. No Data Recorded Assessment / Plan / Recommendation CHL IP CLINICAL IMPRESSIONS 11/25/2015 Therapy Diagnosis Mild pharyngeal phase dysphagia;Mild oral phase dysphagia;Moderate oral phase dysphagia Clinical Impression Mild-mod  oral and mild pharyngeal dysphagia due to lingual weakness/discoordination resulting in decreased manipulation and delayed propulsion due to holding boluses. Pharyngeal deficits due to sensory impairment leading to reduced tongue base retraction and decreased laryngeal elevation leaving mild vallecular and pyriform sinus residue. Verbal cues for second swallow reduced amount. Pt coughed prior to barium consumption and frequently throughout study without encroachment of po's into laryngeal vestibule. Increasing aspiration risk is pt's current confused state. Recommend Dys 1 texture and thin liquids, no straws, small sips, swallow twice, FULL supervision. ST will follow.   Impact on safety and function Moderate aspiration risk   CHL IP TREATMENT RECOMMENDATION 11/25/2015 Treatment Recommendations Therapy as outlined in treatment plan below   Prognosis 11/25/2015 Prognosis for Safe Diet Advancement (No Data) Barriers to Reach Goals Cognitive deficits Barriers/Prognosis Comment -- CHL IP DIET RECOMMENDATION 11/25/2015 SLP Diet Recommendations Dysphagia 1 (Puree) solids;Thin liquid Liquid Administration via Cup;No straw Medication Administration Crushed with puree Compensations Minimize environmental distractions;Slow rate;Small sips/bites;Multiple dry swallows after each bite/sip Postural Changes Seated upright at 90 degrees  CHL IP OTHER RECOMMENDATIONS 11/25/2015 Recommended Consults -- Oral Care Recommendations Oral care BID Other Recommendations --   CHL IP FOLLOW UP RECOMMENDATIONS 11/25/2015 Follow up Recommendations (No Data)   CHL IP FREQUENCY AND DURATION 11/25/2015 Speech Therapy Frequency (ACUTE ONLY) min 2x/week Treatment Duration 2 weeks      CHL IP ORAL PHASE 11/25/2015 Oral Phase Impaired Oral - Pudding Teaspoon -- Oral - Pudding Cup -- Oral - Honey Teaspoon -- Oral - Honey Cup -- Oral - Nectar Teaspoon Delayed oral transit Oral - Nectar Cup Delayed oral transit Oral - Nectar Straw -- Oral - Thin Teaspoon --  Oral - Thin Cup Delayed oral transit Oral - Thin Straw WFL Oral - Puree -- Oral - Mech Soft -- Oral - Regular Delayed oral transit;Weak lingual manipulation Oral - Multi-Consistency -- Oral - Pill -- Oral Phase - Comment --  CHL IP PHARYNGEAL PHASE 11/25/2015 Pharyngeal Phase Impaired Pharyngeal- Pudding Teaspoon -- Pharyngeal -- Pharyngeal- Pudding Cup -- Pharyngeal -- Pharyngeal- Honey Teaspoon -- Pharyngeal -- Pharyngeal- Honey Cup -- Pharyngeal -- Pharyngeal- Nectar Teaspoon WFL Pharyngeal -- Pharyngeal- Nectar Cup Pharyngeal residue - valleculae;Reduced tongue base retraction Pharyngeal -- Pharyngeal- Nectar Straw -- Pharyngeal -- Pharyngeal- Thin Teaspoon -- Pharyngeal -- Pharyngeal- Thin Cup Pharyngeal residue - valleculae;Pharyngeal residue - pyriform;Reduced laryngeal elevation;Reduced tongue base retraction Pharyngeal -- Pharyngeal- Thin Straw Pharyngeal residue - valleculae;Pharyngeal residue - pyriform;Reduced tongue base retraction;Reduced laryngeal elevation Pharyngeal -- Pharyngeal- Puree NT Pharyngeal -- Pharyngeal- Mechanical Soft -- Pharyngeal -- Pharyngeal- Regular Pharyngeal residue - valleculae;Reduced tongue base retraction Pharyngeal -- Pharyngeal- Multi-consistency -- Pharyngeal -- Pharyngeal- Pill -- Pharyngeal -- Pharyngeal Comment --  CHL IP CERVICAL ESOPHAGEAL PHASE 11/25/2015 Cervical Esophageal Phase WFL Pudding Teaspoon -- Pudding Cup -- Honey Teaspoon -- Honey Cup -- Nectar Teaspoon -- Nectar Cup -- Nectar Straw -- Thin Teaspoon -- Thin Cup -- Thin Straw -- Puree -- Mechanical Soft -- Regular -- Multi-consistency -- Pill -- Cervical Esophageal Comment -- No flowsheet data found. Royce Macadamia 11/25/2015, 2:23 PM Breck Coons Lonell Face.Ed CCC-SLP Pager [phone]               Assessment/Plan:   Active Problems:   Acute respiratory failure (HCC)   Healthcare-associated pneumonia   Renal failure   Sepsis (HCC)   Renal failure (ARF), acute on chronic (HCC)   Endotracheal  tube present   Dyspnea   Malnutrition of moderate degree   Elevated liver function tests   Persistent atrial fibrillation (HCC)   CAD in native artery  PAF/Afib with RVR: He has converted to NSR with amiodarone now orally at 200 mg bid, cardizem at 30 mg q 8 hrs and lopressor 25 mg q 8 hrs.  Just on ASA with fall history, dementia and ETOH.  Will stop Cardizem due to mild LV dysfunction with EF 45% on echo (improved from 35-40%).  Will stop digoxin as he is bradycardic.    Elevated troponin: troponin peaked at 0.59-->0.26-->0.19-->0.15. Likely demand ischemia in the setting of known CAD, acute respiratory failure and AKI. He has not complained of any chest pain. Not a great candidate for further ischemic with dementia, alcoholism and non compliance.  ASA 81 mg started yesterday. On sq heparin.   HTN: BP has been  elevated today at 150 -170 systolic cardizem started yesterday for BP and rate control.  Stopping Cardizem due to mild LV dysfunction and bradycardia.  Start amlodipine 5mg  daily.   AKI: creat improved from 5.13--> 1.45  -->1.26 ( s/p  B BKA so Cr alone not a good estimate of Cr Clearance).  Elevated LFTs:  Markedly elevated AST/ALT at 590/1191 on 9/17 but improved now 42/349; will need to monitor closely with amiodarone. Repeat LFT yesterday improving 21/199.   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  Continue BB.  Not on ACE/ARB due to Acute kidney injury on admission. No evidence of CHF on exam.   Armanda Magic, MD  11/28/2015  9:53 AM

## 2015-11-29 ENCOUNTER — Inpatient Hospital Stay (HOSPITAL_COMMUNITY): Payer: Medicare Other

## 2015-11-29 DIAGNOSIS — R569 Unspecified convulsions: Secondary | ICD-10-CM

## 2015-11-29 LAB — GLUCOSE, CAPILLARY
GLUCOSE-CAPILLARY: 125 mg/dL — AB (ref 65–99)
GLUCOSE-CAPILLARY: 95 mg/dL (ref 65–99)
Glucose-Capillary: 120 mg/dL — ABNORMAL HIGH (ref 65–99)
Glucose-Capillary: 129 mg/dL — ABNORMAL HIGH (ref 65–99)
Glucose-Capillary: 131 mg/dL — ABNORMAL HIGH (ref 65–99)
Glucose-Capillary: 144 mg/dL — ABNORMAL HIGH (ref 65–99)
Glucose-Capillary: 85 mg/dL (ref 65–99)

## 2015-11-29 MED ORDER — METOPROLOL TARTRATE 25 MG PO TABS: 25.0000 mg | ORAL_TABLET | Freq: Two times a day (BID) | ORAL | Status: DC

## 2015-11-29 MED ORDER — HYDRALAZINE HCL 20 MG/ML IJ SOLN: 5.0000 mg | INTRAMUSCULAR | Status: DC | PRN

## 2015-11-29 NOTE — Procedures (Signed)
History: 75 year old male with a history of seizures, persistent encephalopathy  Sedation: None  Technique: This is a 21 channel routine scalp EEG performed at the bedside with bipolar and monopolar montages arranged in accordance to the international 10/20 system of electrode placement. One channel was dedicated to EKG recording.    Background: The background consists predominantly of irregular delta and theta activity. There is a poorly formed and poorly sustained posterior dominant rhythm that does achieve a frequency of 9 Hz. No clear sleep structures were recorded  Photic stimulation: Physiologic driving is not performed  EEG Abnormalities: 1) generalized irregular slow activity  Clinical Interpretation: This normal EEG is consistent with a generalized nonspecific cerebral dysfunction (encephalopathy). There was no seizure or seizure predisposition recorded on this study. Please note that a normal EEG does not preclude the possibility of epilepsy.   Ritta SlotMcNeill Danton Palmateer, MD [facility] [phone]  If 7pm- 7am, please page neurology on call as listed in AMION.

## 2015-11-29 NOTE — Progress Notes (Signed)
PROGRESS NOTE  [name]:[id number] DOB: [birth year] DOA: Dec 07, 2015 PCP: Londell Moh, MD   LOS: 8 days   Brief Narrative: Patient is a 75 y.o. male resident of a skilled nursing facility, history of COPD, CAD status post CABG (2003), PAD status post bilateral BKA, dementia, paroxysmal atrial fibrillation no longer on anticoagulation admitted to the intensive care unit on 9/16 with acute hypercarbic respiratory failure likely from acute exacerbation of COPD. Initially started on BiPAP, however required intubation post admission. He was managed with empiric antibiotics and other supportive measures, and subsequently extubated on 9/18. Hospital course has also been complicated by development of atrial fibrillation with RVR requiring IV digoxin and IV amiodarone infusion, transaminitis and acute renal failure. Patient was [facility] to the [facility] on [facility]/20.   Assessment & Plan: Active Problems:   Acute respiratory failure (HCC)   Healthcare-associated pneumonia   Renal failure   Sepsis (HCC)   Renal failure (ARF), acute on chronic (HCC)   Endotracheal tube present   Dyspnea   Malnutrition of moderate degree   Elevated liver function tests   Persistent atrial fibrillation (HCC)   CAD in native artery   PAF (paroxysmal atrial fibrillation) (HCC)   Seizure activity - see 9/23 notes, new onset seizures, has been unresponsive since. No longer seizing. - EEG again today. Talked with Dr. Amada Jupiter  Concern for PRESS on MRI - patient with HTN and occasionally 170s systolic. In a chronic vasculopath and long standing HTN I doubt that this BP can cause PRESS but may be an explanation for his seizures. - BP currently controlled  Acute hypercarbic respiratory failure due to COPD exacerbation - Improved, extubated on 9/18. Secondary to COPD exacerbation. - complicated by #1 now, likely aspirated during seizure. On NRB. Family confirmed DNR 9/23  Severe  sepsis secondary to Healthcare associated pneumonia  - Improving-sepsis pathophysiology has resolved, patient is now afebrile and without any leukocytosis.  Acute metabolic encephalopathy - Probably secondary to sepsis and hypercarbia - now obtunded   Atrial fibrillation with RVR  - Initially required rate control with with IV amiodarone, IV digoxin and IV Lopressor. Patient is now back in sinus rhythm. Cardiology following. - now bradycardcic  Mildly elevated troponin - Trend is flat and not consistent with ACS. Suspect more secondary to demand ischemia. Continue Aspirin when able to take po   Acute on chronic renal disease stage III  - Acute renal failure secondary secondary to ATN. Creatinine improving with supportive care. Nephrology has signed off.  - Recommendations are not to restart ACEs/ARB  Dysphagia - keep NPO  Transaminitis - likely secondary to sepsis or from EtOH/statin use. Acute hepatitis serology negative. LFT's downtrending. Avoid hepatotoxic medications.  Thrombocytopenia - Mild, suspect secondary to sepsis  HTN - suspect crhonically elevated  Type 2 diabetes - CBGs elevated-as patient on steroids-continue 8 units of Lantus and SSI.  History of CAD status post CABG  - continue Aspirin - Statins on hold due to transaminitis.  History of PAD status post bilateral BKA  AAA  - distal, measured 3 cm x 3 xcm in 2014  Dementia with mild delirium   GERD: Continue PPI  Malnutrition of moderate degree   DVT prophylaxis: heparin Code Status: DNR Family Communication: discussed with daughter [name] at length bedside 9/23 Disposition Plan: TBD  Consultants:   Cardiology   PCCM  Procedures:   ETT 9/16 >> 9/18  Right IJ 9/16 >> 9/18  Antimicrobials:  Vancomycin / Cefepime 9/16 >>  9/18  Ceftriaxone 9/18 >>  Plan to stop 9/24  Subjective: - alert, confused.   Objective: Vitals:   11/29/15 0516 11/29/15 0926 11/29/15 0950  11/29/15 1354  BP: (!) 135/42  (!) 137/39   Pulse: 86  96   Resp: 15  18   Temp: 98.7 F (37.1 C)  97.9 F (36.6 C)   TempSrc: Axillary  Axillary   SpO2: 92% (!) 86% 94% 100%  Weight:      Height:        Intake/Output Summary (Last 24 hours) at 11/29/15 1458 Last data filed at 11/29/15 1424  Gross per 24 hour  Intake              100 ml  Output              400 ml  Net             -300 ml   Filed Weights   11/26/15 0437 11/27/15 0455 11/27/15 2011  Weight: 65 kg (143 lb 4.8 oz) 65.1 kg (143 lb 8.3 oz) 65.4 kg (144 lb 2.9 oz)    Examination: Constitutional: unresponsive Vitals:   11/29/15 0516 11/29/15 0926 11/29/15 0950 11/29/15 1354  BP: (!) 135/42  (!) 137/39   Pulse: 86  96   Resp: 15  18   Temp: 98.7 F (37.1 C)  97.9 F (36.6 C)   TempSrc: Axillary  Axillary   SpO2: 92% (!) 86% 94% 100%  Weight:      Height:       HEENT: PERRL Respiratory: coarse breath sounds bilaterally Cardiovascular: Regular rate and rhythm, no murmurs / rubs / gallops. No LE edema. 2+ pedal pulses.  Abdomen: no tenderness. Bowel sounds positive.  Musculoskeletal: no clubbing / cyanosis. Bilateral BKA Neurologic: obtunded, does not follow commands  Data Reviewed: I have personally reviewed following labs and imaging studies  CBC:  Recent Labs Lab 11/23/15 0445 11/26/15 0246 11/28/15 0343  WBC 10.5 9.9 6.9  NEUTROABS 9.9*  --   --   HGB 12.6* 15.0 13.9  HCT 38.9* 45.2 43.5  MCV 97.7 96.0 98.0  PLT 121* 125* 142*   Basic Metabolic Panel:  Recent Labs Lab 11/22/15 1715 11/23/15 0445 11/23/15 1543 11/25/15 0316 11/26/15 0246 11/27/15 1202 11/28/15 0343  NA 140 140 141 140 144 147* 147*  K 3.5 3.6 4.1 4.4 3.9 3.9 3.4*  CL 111 110 111 109 112* 111 104  CO2 21* 22 23 21* 23 28 33*  GLUCOSE 240* 221* 146* 119* 97 193* 243*  BUN 79* 78* 82* 62* 50* 42* 37*  CREATININE 2.87* 2.28* 2.03* 1.45* 1.26* 1.17 1.08  CALCIUM 7.7* 7.8* 7.9* 8.7* 8.7* 8.7* 8.9  MG 2.0 2.0 1.9  --    --   --   --   PHOS 3.9 3.9 4.3  --   --   --   --    GFR: Estimated Creatinine Clearance: 32.6 mL/min (by C-G formula based on SCr of 1.08 mg/dL). Liver Function Tests:  Recent Labs Lab 11/23/15 1323 11/26/15 0246 11/27/15 1202 11/28/15 0343  AST 144* 42* 20 21  ALT 765* 349* 232* 199*  ALKPHOS 73 70 64 67  BILITOT 0.6 0.9 1.1 1.6*  PROT 5.1* 5.3* 5.2* 5.3*  ALBUMIN 2.4* 2.6* 2.7* 2.7*   No results for input(s): LIPASE, AMYLASE in the last 168 hours. No results for input(s): AMMONIA in the last 168 hours. Coagulation Profile: No results for input(s): INR, PROTIME in  the last 168 hours. Cardiac Enzymes:  Recent Labs Lab 11/23/15 1323 11/24/15 0151 11/24/15 0655 11/24/15 1331  TROPONINI 0.26* 0.19* 0.19* 0.15*   BNP (last 3 results) No results for input(s): PROBNP in the last 8760 hours. HbA1C: No results for input(s): HGBA1C in the last 72 hours. CBG:  Recent Labs Lab 11/28/15 2107 11/29/15 0012 11/29/15 0503 11/29/15 0759 11/29/15 1140  GLUCAP 129* 131* 144* 125* 129*   Lipid Profile: No results for input(s): CHOL, HDL, LDLCALC, TRIG, CHOLHDL, LDLDIRECT in the last 72 hours. Thyroid Function Tests: No results for input(s): TSH, T4TOTAL, FREET4, T3FREE, THYROIDAB in the last 72 hours. Anemia Panel: No results for input(s): VITAMINB12, FOLATE, FERRITIN, TIBC, IRON, RETICCTPCT in the last 72 hours. Urine analysis:    Component Value Date/Time   COLORURINE YELLOW 12/04/2015 2210   APPEARANCEUR CLOUDY (A) 11/28/2015 2210   LABSPEC 1.020 11/18/2015 2210   PHURINE 5.0 11/13/2015 2210   GLUCOSEU NEGATIVE 11/22/2015 2210   HGBUR NEGATIVE 11/16/2015 2210   BILIRUBINUR SMALL (A) 11/09/2015 2210   KETONESUR NEGATIVE 11/08/2015 2210   PROTEINUR 30 (A) 11/13/2015 2210   UROBILINOGEN 0.2 06/23/2011 1739   NITRITE NEGATIVE 11/24/2015 2210   LEUKOCYTESUR NEGATIVE 11/23/2015 2210   Sepsis Labs: Invalid input(s): PROCALCITONIN, LACTICIDVEN  Recent Results  (from the past 240 hour(s))  Blood Culture (routine x 2)     Status: None   Collection Time: 11/18/2015 10:25 AM  Result Value Ref Range Status   Specimen Description BLOOD LEFT ARM  5 ML IN [facility] BOTTLE  Final   Special Requests NONE  Final   Culture   Final    NO GROWTH 5 DAYS Performed at [facility]    Report Status 11/26/2015 FINAL  Final  Blood Culture (routine x 2)     Status: None   Collection Time: 11/06/2015 11:10 AM  Result Value Ref Range Status   Specimen Description BLOOD RIGHT FOREARM  5 ML IN AEROBIC ONLY  Final   Special Requests NONE  Final   Culture   Final    NO GROWTH 5 DAYS Performed at [facility]    Report Status 11/26/2015 FINAL  Final  MRSA PCR Screening     Status: None   Collection Time: 12/02/2015  6:58 PM  Result Value Ref Range Status   MRSA by PCR NEGATIVE NEGATIVE Final    Comment:        The GeneXpert MRSA Assay (FDA approved for NASAL specimens only), is one component of a comprehensive MRSA colonization surveillance program. It is not intended to diagnose MRSA infection nor to guide or monitor treatment for MRSA infections.   Urine culture     Status: None   Collection Time: 11/13/2015 10:10 PM  Result Value Ref Range Status   Specimen Description URINE, RANDOM  Final   Special Requests NONE  Final   Culture NO GROWTH  Final   Report Status 11/23/2015 FINAL  Final  Culture, respiratory (NON-Expectorated)     Status: None   Collection Time: 11/22/15  9:37 AM  Result Value Ref Range Status   Specimen Description TRACHEAL ASPIRATE  Final   Special Requests NONE  Final   Gram Stain   Final    ABUNDANT WBC PRESENT, PREDOMINANTLY PMN RARE GRAM POSITIVE COCCI IN PAIRS    Culture Consistent with normal respiratory flora.  Final   Report Status 11/25/2015 FINAL  Final      Radiology Studies: Ct Head Wo Contrast  Result Date:  11/28/2015 CLINICAL DATA:  Syncope with questionable seizure. EXAM: CT HEAD WITHOUT CONTRAST  TECHNIQUE: Contiguous axial images were obtained from the base of the skull through the vertex without intravenous contrast. COMPARISON:  Head CT June 23, 2011 and brain MRI June 24, 2011 FINDINGS: Brain: Mild diffuse atrophy is stable. There is no intracranial mass, hemorrhage, extra-axial fluid collection, midline shift. There is a prior left frontal lobe infarct with encephalomalacia in this area, stable. There is extensive small vessel disease throughout the centra semiovale bilaterally. Small vessel disease is noted throughout much of the left external capsule. There is small vessel disease in each thalamus. No acute appearing infarct is evident. Vascular: No hypervascular lesions are evident. There is extensive carotid siphon calcification bilaterally. There is a focal area of calcification in the distal left vertebral artery. Foci of calcification are also noted in the carotid artery slightly proximal to the siphon on each side. Skull: The bony calvarium appears intact. Sinuses/Orbits: Orbits appear symmetric bilaterally. Visualized paranasal sinuses are clear. There is leftward deviation of the nasal septum. Other: Mastoid air cells are clear. IMPRESSION: Stable atrophy. Extensive supratentorial small vessel disease. Prior left frontal lobe infarct with encephalomalacia. Small vessel disease in each thalamus and left external capsule, stable. No acute infarct evident. No hemorrhage or mass effect. Extensive arterial vascular calcification noted. Deviated nasal septum noted. Electronically Signed   By: Bretta BangWilliam  Woodruff III M.D.   On: 11/28/2015 16:03   Mr Brain Wo Contrast  Result Date: 11/28/2015 CLINICAL DATA:  Seizures today, RIGHT extremity twitching. History of hypertension, hyperlipidemia, diabetes, stroke. EXAM: MRI HEAD WITHOUT CONTRAST TECHNIQUE: Multiplanar, multiecho pulse sequences of the brain and surrounding structures were obtained without intravenous contrast. COMPARISON:  CT HEAD  November 28, 2015 at 1552 hours and MRI of the brain June 24, 2011 FINDINGS: Most sequences are moderately motion degraded. BRAIN: Tubular reduced diffusion RIGHT frontal lobe pre central gyrus measuring less than 1 cm in transaxial dimension with corresponding low ADC values. No susceptibility artifact to suggest hemorrhage. Faint susceptibility artifact in LEFT frontal lobe encephalomalacia with mild ex vacuo dilatation LEFT lateral ventricle. Moderate ventriculomegaly on the basis of global parenchymal brain volume loss. Old small RIGHT cerebellar infarcts. Confluent supratentorial and infratentorial FLAIR T2 hyperintensities. Superimposed patchy abnormal FLAIR T2 hyperintense signal extending to the parietal occipital cortices and cerebellar hemispheres. Symmetric patchy bright T2 signal in the thalamus. No midline shift, mass effect or masses. No abnormal extra-axial fluid collections. Motion degraded thin slice coronal T2 prohibits evaluation of the hippocampi. VASCULAR: Normal major intracranial vascular flow voids present at skull base. SKULL AND UPPER CERVICAL SPINE: No abnormal sellar expansion. No suspicious calvarial bone marrow signal. Craniocervical junction maintained. SINUSES/ORBITS: The mastoid air-cells and included paranasal sinuses are well-aerated. The included ocular globes and orbital contents are non-suspicious. OTHER: None. IMPRESSION: Acute small RIGHT frontal lobe infarct. 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 These results will be called to the ordering clinician or representative by the Radiologist Assistant, and communication documented in the PACS or zVision Dashboard. Electronically Signed   By: Awilda Metroourtnay  Bloomer M.D.   On: 11/28/2015 23:00   Dg Chest Port 1  View  Result Date: 11/28/2015 CLINICAL DATA:  Hypoxia EXAM: PORTABLE CHEST 1 VIEW COMPARISON:  Chest radiograph from one day prior. FINDINGS: Sternotomy wires appear aligned and intact. Stable cardiomediastinal silhouette with  normal heart size and aortic atherosclerosis. No pneumothorax. Stable small bilateral pleural effusions. No overt pulmonary edema. Stable mild bibasilar lung opacities, favor atelectasis. IMPRESSION: 1. Stable small bilateral pleural effusions . 2. Stable mild bibasilar lung opacities, favor atelectasis. 3. Aortic atherosclerosis. Electronically Signed   By: Delbert Phenix M.D.   On: 11/28/2015 16:41     Scheduled Meds: . albuterol  5 mg Nebulization Once  . amLODipine  5 mg Oral Daily  . aspirin EC  81 mg Oral Daily  . budesonide (PULMICORT) nebulizer solution  0.5 mg Nebulization BID  . donepezil  10 mg Oral QHS  . famotidine  20 mg Oral QHS  . feeding supplement (ENSURE ENLIVE)  237 mL Oral BID BM  . feeding supplement (PRO-STAT SUGAR FREE 64)  30 mL Oral TID  . heparin  5,000 Units Subcutaneous Q8H  . insulin aspart  0-9 Units Subcutaneous Q4H  . insulin glargine  8 Units Subcutaneous Daily  . ipratropium-albuterol  3 mL Nebulization TID  . levETIRAcetam  1,000 mg Intravenous Q12H  . methylPREDNISolone (SOLU-MEDROL) injection  40 mg Intravenous Q12H  . phenytoin (DILANTIN) IV  100 mg Intravenous Q8H   Continuous Infusions:    Pamella Pert, MD, PhD [facility] Pager [phone]  If 7PM-7AM, please contact night-coverage [email or web address] Password [facility] 11/29/2015, 2:58 PM

## 2015-11-29 NOTE — Progress Notes (Signed)
STAT EEG completed- results pending. DR Amada JupiterKirkpatrick aware.

## 2015-11-29 NOTE — Progress Notes (Addendum)
SUBJECTIVE:  Had seizure yesterday and now unresponsive  OBJECTIVE:   Vitals:   Vitals:   11/28/15 2105 11/29/15 0516 11/29/15 0926 11/29/15 0950  BP: (!) 135/41 (!) 135/42  (!) 137/39  Pulse: 91 86  96  Resp: 16 15  18   Temp: 98.4 F (36.9 C) 98.7 F (37.1 C)  97.9 F (36.6 C)  TempSrc: Axillary Axillary  Axillary  SpO2: 98% 92% (!) 86% 94%  Weight:      Height:       I&O's:   Intake/Output Summary (Last 24 hours) at 11/29/15 1035 Last data filed at 11/29/15 0955  Gross per 24 hour  Intake              190 ml  Output              325 ml  Net             -135 ml   TELEMETRY: Reviewed telemetry pt in sinus bradycardia     PHYSICAL EXAM General: Well developed, well nourished, in no acute distress Lungs:   Clear bilaterally to auscultation and percussion. Heart:   HRRR S1 S2 Pulses are 2+ & equal. Abdomen: Bowel sounds are positive, abdomen soft and non-tender without masses Msk:  Back normal, normal gait. Normal strength and tone for age. Extremities:   No clubbing, cyanosis or edema.  DP +1 Neuro: unresponsive Psych:  unresponsive   LABS: Basic Metabolic Panel:  Recent Labs  69/62/95 1202 11/28/15 0343  NA 147* 147*  K 3.9 3.4*  CL 111 104  CO2 28 33*  GLUCOSE 193* 243*  BUN 42* 37*  CREATININE 1.17 1.08  CALCIUM 8.7* 8.9   Liver Function Tests:  Recent Labs  11/27/15 1202 11/28/15 0343  AST 20 21  ALT 232* 199*  ALKPHOS 64 67  BILITOT 1.1 1.6*  PROT 5.2* 5.3*  ALBUMIN 2.7* 2.7*   No results for input(s): LIPASE, AMYLASE in the last 72 hours. CBC:  Recent Labs  11/28/15 0343  WBC 6.9  HGB 13.9  HCT 43.5  MCV 98.0  PLT 142*   Cardiac Enzymes: No results for input(s): CKTOTAL, CKMB, CKMBINDEX, TROPONINI in the last 72 hours. BNP: Invalid input(s): POCBNP D-Dimer: No results for input(s): DDIMER in the last 72 hours. Hemoglobin A1C: No results for input(s): HGBA1C in the last 72 hours. Fasting Lipid Panel: No results for  input(s): CHOL, HDL, LDLCALC, TRIG, CHOLHDL, LDLDIRECT in the last 72 hours. Thyroid Function Tests: No results for input(s): TSH, T4TOTAL, T3FREE, THYROIDAB in the last 72 hours.  Invalid input(s): FREET3 Anemia Panel: No results for input(s): VITAMINB12, FOLATE, FERRITIN, TIBC, IRON, RETICCTPCT in the last 72 hours. Coag Panel:   Lab Results  Component Value Date   INR 1.10 09/14/2010   INR 1.3 07/31/2008   INR 1.2 07/29/2008    RADIOLOGY: Ct Head Wo Contrast  Result Date: 11/28/2015 CLINICAL DATA:  Syncope with questionable seizure. EXAM: CT HEAD WITHOUT CONTRAST TECHNIQUE: Contiguous axial images were obtained from the base of the skull through the vertex without intravenous contrast. COMPARISON:  Head CT June 23, 2011 and brain MRI June 24, 2011 FINDINGS: Brain: Mild diffuse atrophy is stable. There is no intracranial mass, hemorrhage, extra-axial fluid collection, midline shift. There is a prior left frontal lobe infarct with encephalomalacia in this area, stable. There is extensive small vessel disease throughout the centra semiovale bilaterally. Small vessel disease is noted throughout much of the left external capsule. There is  small vessel disease in each thalamus. No acute appearing infarct is evident. Vascular: No hypervascular lesions are evident. There is extensive carotid siphon calcification bilaterally. There is a focal area of calcification in the distal left vertebral artery. Foci of calcification are also noted in the carotid artery slightly proximal to the siphon on each side. Skull: The bony calvarium appears intact. Sinuses/Orbits: Orbits appear symmetric bilaterally. Visualized paranasal sinuses are clear. There is leftward deviation of the nasal septum. Other: Mastoid air cells are clear. IMPRESSION: Stable atrophy. Extensive supratentorial small vessel disease. Prior left frontal lobe infarct with encephalomalacia. Small vessel disease in each thalamus and left external  capsule, stable. No acute infarct evident. No hemorrhage or mass effect. Extensive arterial vascular calcification noted. Deviated nasal septum noted. Electronically Signed   By: Bretta BangWilliam  Woodruff III M.D.   On: 11/28/2015 16:03   Mr Brain Wo Contrast  Result Date: 11/28/2015 CLINICAL DATA:  Seizures today, RIGHT extremity twitching. History of hypertension, hyperlipidemia, diabetes, stroke. EXAM: MRI HEAD WITHOUT CONTRAST TECHNIQUE: Multiplanar, multiecho pulse sequences of the brain and surrounding structures were obtained without intravenous contrast. COMPARISON:  CT HEAD November 28, 2015 at 1552 hours and MRI of the brain June 24, 2011 FINDINGS: Most sequences are moderately motion degraded. BRAIN: Tubular reduced diffusion RIGHT frontal lobe pre central gyrus measuring less than 1 cm in transaxial dimension with corresponding low ADC values. No susceptibility artifact to suggest hemorrhage. Faint susceptibility artifact in LEFT frontal lobe encephalomalacia with mild ex vacuo dilatation LEFT lateral ventricle. Moderate ventriculomegaly on the basis of global parenchymal brain volume loss. Old small RIGHT cerebellar infarcts. Confluent supratentorial and infratentorial FLAIR T2 hyperintensities. Superimposed patchy abnormal FLAIR T2 hyperintense signal extending to the parietal occipital cortices and cerebellar hemispheres. Symmetric patchy bright T2 signal in the thalamus. No midline shift, mass effect or masses. No abnormal extra-axial fluid collections. Motion degraded thin slice coronal T2 prohibits evaluation of the hippocampi. VASCULAR: Normal major intracranial vascular flow voids present at skull base. SKULL AND UPPER CERVICAL SPINE: No abnormal sellar expansion. No suspicious calvarial bone marrow signal. Craniocervical junction maintained. SINUSES/ORBITS: The mastoid air-cells and included paranasal sinuses are well-aerated. The included ocular globes and orbital contents are non-suspicious.  OTHER: None. IMPRESSION: Acute small RIGHT frontal lobe infarct. 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 These results will be called to the ordering clinician or representative by the Radiologist Assistant, and communication documented in the PACS or zVision Dashboard. Electronically Signed   By: Awilda Metroourtnay  Bloomer M.D.   On: 11/28/2015 23:00   [place] Renal  Result Date: 11/07/2015 CLINICAL DATA:  Renal failure, acute on chronic, hypertension, diabetes mellitus, hyperlipidemia, coronary artery disease post MI, CHF, COPD, paroxysmal atrial fibrillation EXAM: RENAL / URINARY TRACT ULTRASOUND COMPLETE COMPARISON:  Abdomen ultrasound 03/28/2013 FINDINGS: Right Kidney: Length: 11.6 cm. Normal cortical thickness. Increased cortical echogenicity. Several tiny RIGHT renal cysts. No solid mass, hydronephrosis or shadowing calcification. Left Kidney: Length: 10.3 cm. Suboptimal visualization due to body habitus and limited positioning (patient on ventilator) poorly visualized. Normal cortical thickness. No gross evidence of mass or hydronephrosis. Bladder: Poorly distended, with suboptimal assessment of wall thickness. Prostate enlargement with gland measuring 4.8 x 3.9 x 3.1 cm. IMPRESSION: Prostatic enlargement. Tiny RIGHT renal cyst. Question medical renal disease changes. Suboptimal visualization of LEFT kidney as above. Electronically Signed   By: Loraine LericheMark  Tyron Russell M.D.   On: 11/30/2015 16:26   Dg Chest Port 1 View  Result Date: 11/28/2015 CLINICAL DATA:  Hypoxia EXAM: PORTABLE CHEST 1 VIEW COMPARISON:  Chest radiograph from one day prior. FINDINGS: Sternotomy wires appear aligned and intact. Stable cardiomediastinal silhouette with normal heart size and aortic atherosclerosis. No pneumothorax. Stable small  bilateral pleural effusions. No overt pulmonary edema. Stable mild bibasilar lung opacities, favor atelectasis. IMPRESSION: 1. Stable small bilateral pleural effusions . 2. Stable mild bibasilar lung opacities, favor atelectasis. 3. Aortic atherosclerosis. Electronically Signed   By: Delbert Phenix M.D.   On: 11/28/2015 16:41   Dg Chest Port 1 View  Result Date: 11/27/2015 CLINICAL DATA:  Cough and congestion with shortness of Breath EXAM: PORTABLE CHEST 1 VIEW COMPARISON:  11/24/2019 FINDINGS: Cardiac shadow is stable. Postoperative changes are again seen. Left pleural effusion is again identified and stable. Underlying basilar atelectasis is noted. Smaller right pleural effusion is seen. Changes of prior left rib fractures are noted. IMPRESSION: Bilateral pleural effusions with atelectasis left greater than right. The overall appearance is stable from the prior exam. Electronically Signed   By: Alcide Clever M.D.   On: 11/27/2015 10:28   Dg Chest Port 1 View  Result Date: 11/24/2015 CLINICAL DATA:  Acute respiratory failure.  Shortness of breath EXAM: PORTABLE CHEST 1 VIEW COMPARISON:  11/23/2015 FINDINGS: Postoperative changes in the mediastinum. Interval removal of endotracheal tube, enteric tube, and central venous catheters. Normal heart size and pulmonary vascularity. Bilateral pleural effusions with basilar infiltration or atelectasis. Old left rib fractures. Calcified and tortuous aorta. No pneumothorax. Degenerative changes in the shoulders. IMPRESSION: Bilateral pleural effusions with basilar atelectasis or infiltration. Electronically Signed   By: Burman Nieves M.D.   On: 11/24/2015 02:24   Dg Chest Port 1 View  Result Date: 11/23/2015 CLINICAL DATA:  Status post intubation EXAM: PORTABLE CHEST 1 VIEW COMPARISON:  11/22/2015 FINDINGS: Cardiac shadow is stable. Endotracheal tube, nasogastric catheter and right jugular line are again seen and stable. Lungs are well aerated bilaterally.  Increasing right basilar atelectasis is noted. Persistent left basilar infiltrate is noted. Old rib fractures are seen on the left. IMPRESSION: Stable left basilar infiltrate. Increasing right basilar atelectasis. Tubes and lines as described. Electronically Signed   By: Alcide Clever M.D.   On: 11/23/2015 07:55   Dg Chest Port 1 View  Result Date: 11/22/2015 CLINICAL DATA:  75 year old male with respiratory failure. EXAM: PORTABLE CHEST 1 VIEW COMPARISON:  12/02/2015 and prior radiograph FINDINGS: An endotracheal tube with tip 4.8 cm above the carina, NG tube entering the stomach with tip off the field of view and right IJ central venous catheter with tip overlying the mid -lower SVC again noted. Cardiomediastinal silhouette is unchanged. CABG changes again noted. Left lower lung airspace disease and small left pleural effusion again noted. There has been little interval change since the prior study. . IMPRESSION: No significant change. Continued left lower lung airspace disease and small left pleural effusion. Electronically Signed   By: Harmon Pier M.D.   On: 11/22/2015 09:02   Dg Chest Port 1 View  Result Date: 11/20/2015 CLINICAL DATA:  Intubated EXAM: PORTABLE CHEST 1 VIEW COMPARISON:  Chest radiograph from earlier today. FINDINGS: Endotracheal tube tip is 4.1 cm above the carina. Right internal jugular central venous catheter terminates in the lower third of the superior vena cava. Sternotomy wires appear aligned and intact. Stable cardiomediastinal silhouette with normal heart size and atherosclerotic thoracic aorta. No pneumothorax. Stable small  left pleural effusion and patchy left lung base opacity. No pulmonary edema. IMPRESSION: 1. Well-positioned support structures as described. No pneumothorax. 2. Stable patchy left lung base opacity, which could represent aspiration, pneumonia or atelectasis. 3. Probable stable small left pleural effusion. Electronically Signed   By: Delbert Phenix M.D.   On:  12-21-15 18:02   Dg Chest Portable 1 View  Result Date: 12-21-2015 CLINICAL DATA:  Productive cough x 2-3 days, worsening of SOB last night; hx CHF, COPD, HTN, dementia, smoker; pt was very unstable, confused, tech was not able to position pt for better AP view. EXAM: PORTABLE CHEST 1 VIEW COMPARISON:  06/23/2011 FINDINGS: Cardiac silhouette is normal in size. There stable changes from previous CABG surgery. No mediastinal or hilar masses. There is hazy opacity in the left lung base. This a reflect atelectasis or pneumonia. Lungs otherwise clear. No convincing pleural effusion. No gross pneumothorax. The bony thorax is demineralized. There are old healed rib fractures on the left. IMPRESSION: 1. Hazy left lung base opacity which may reflect pneumonia or atelectasis. No other evidence of acute cardiopulmonary disease. Electronically Signed   By: Amie Portland M.D.   On: 2015-12-21 10:46   Dg Swallowing Func-speech Pathology  Result Date: 11/25/2015 Objective Swallowing Evaluation: Type of Study: MBS-Modified Barium Swallow Study Patient Details Name: [name] MRN: [id number] Date of Birth: 1940/03/12 Today's Date: 11/25/2015 Time: SLP Start Time (ACUTE ONLY): 1200-SLP Stop Time (ACUTE ONLY): 1218 SLP Time Calculation (min) (ACUTE ONLY): 18 min Past Medical History: Past Medical History: Diagnosis Date . CHF (congestive heart failure) (HCC)   S/p CABG  . Chronic kidney disease   Baseline Cr appears to be 1.2-1.4  . COPD (chronic obstructive pulmonary disease) (HCC)  . Coronary artery disease  . CVA (cerebral infarction)   CT head 06/2011 with remote left frontal infarct, remote basal ganglia  lacunar infarcts bilaterally . Dementia  . Diabetes mellitus  . Hyperlipidemia  . Hypertension  . Myocardial infarction (HCC)  . Occlusion and stenosis of carotid artery without mention of cerebral infarction  . Paroxysmal a-fib (HCC)  . PVD (peripheral vascular disease) (HCC)   S/p bilateral BKA's  . Tobacco  abuse   States 2 PPD currently  Past Surgical History: Past Surgical History: Procedure Laterality Date . Coronary artery bypass x3  01/04/2002 . Lt below-the-knee amputation  12/07/2009 . RT below-the-knee amputation  07/31/2008 HPI: 75 year old smoker ETOH abuse, COPD, CVA, dementia, bilateral amputee, CABG, HTN nursing home resident admitted for respiratory distress.Intubated 9/16-9/18. CXR Bilateral pleural effusions with basilar atelectasis or infiltration. No prior ST notes. No Data Recorded Assessment / Plan / Recommendation CHL IP CLINICAL IMPRESSIONS 11/25/2015 Therapy Diagnosis Mild pharyngeal phase dysphagia;Mild oral phase dysphagia;Moderate oral phase dysphagia Clinical Impression Mild-mod oral and mild pharyngeal dysphagia due to lingual weakness/discoordination resulting in decreased manipulation and delayed propulsion due to holding boluses. Pharyngeal deficits due to sensory impairment leading to reduced tongue base retraction and decreased laryngeal elevation leaving mild vallecular and pyriform sinus residue. Verbal cues for second swallow reduced amount. Pt coughed prior to barium consumption and frequently throughout study without encroachment of po's into laryngeal vestibule. Increasing aspiration risk is pt's current confused state. Recommend Dys 1 texture and thin liquids, no straws, small sips, swallow twice, FULL supervision. ST will follow.   Impact on safety and function Moderate aspiration risk   CHL IP TREATMENT RECOMMENDATION 11/25/2015 Treatment Recommendations Therapy as outlined in treatment plan below   Prognosis 11/25/2015 Prognosis for Safe Diet Advancement (  No Data) Barriers to Reach Goals Cognitive deficits Barriers/Prognosis Comment -- CHL IP DIET RECOMMENDATION 11/25/2015 SLP Diet Recommendations Dysphagia 1 (Puree) solids;Thin liquid Liquid Administration via Cup;No straw Medication Administration Crushed with puree Compensations Minimize environmental distractions;Slow  rate;Small sips/bites;Multiple dry swallows after each bite/sip Postural Changes Seated upright at 90 degrees   CHL IP OTHER RECOMMENDATIONS 11/25/2015 Recommended Consults -- Oral Care Recommendations Oral care BID Other Recommendations --   CHL IP FOLLOW UP RECOMMENDATIONS 11/25/2015 Follow up Recommendations (No Data)   CHL IP FREQUENCY AND DURATION 11/25/2015 Speech Therapy Frequency (ACUTE ONLY) min 2x/week Treatment Duration 2 weeks      CHL IP ORAL PHASE 11/25/2015 Oral Phase Impaired Oral - Pudding Teaspoon -- Oral - Pudding Cup -- Oral - Honey Teaspoon -- Oral - Honey Cup -- Oral - Nectar Teaspoon Delayed oral transit Oral - Nectar Cup Delayed oral transit Oral - Nectar Straw -- Oral - Thin Teaspoon -- Oral - Thin Cup Delayed oral transit Oral - Thin Straw WFL Oral - Puree -- Oral - Mech Soft -- Oral - Regular Delayed oral transit;Weak lingual manipulation Oral - Multi-Consistency -- Oral - Pill -- Oral Phase - Comment --  CHL IP PHARYNGEAL PHASE 11/25/2015 Pharyngeal Phase Impaired Pharyngeal- Pudding Teaspoon -- Pharyngeal -- Pharyngeal- Pudding Cup -- Pharyngeal -- Pharyngeal- Honey Teaspoon -- Pharyngeal -- Pharyngeal- Honey Cup -- Pharyngeal -- Pharyngeal- Nectar Teaspoon WFL Pharyngeal -- Pharyngeal- Nectar Cup Pharyngeal residue - valleculae;Reduced tongue base retraction Pharyngeal -- Pharyngeal- Nectar Straw -- Pharyngeal -- Pharyngeal- Thin Teaspoon -- Pharyngeal -- Pharyngeal- Thin Cup Pharyngeal residue - valleculae;Pharyngeal residue - pyriform;Reduced laryngeal elevation;Reduced tongue base retraction Pharyngeal -- Pharyngeal- Thin Straw Pharyngeal residue - valleculae;Pharyngeal residue - pyriform;Reduced tongue base retraction;Reduced laryngeal elevation Pharyngeal -- Pharyngeal- Puree NT Pharyngeal -- Pharyngeal- Mechanical Soft -- Pharyngeal -- Pharyngeal- Regular Pharyngeal residue - valleculae;Reduced tongue base retraction Pharyngeal -- Pharyngeal- Multi-consistency -- Pharyngeal --  Pharyngeal- Pill -- Pharyngeal -- Pharyngeal Comment --  CHL IP CERVICAL ESOPHAGEAL PHASE 11/25/2015 Cervical Esophageal Phase WFL Pudding Teaspoon -- Pudding Cup -- Honey Teaspoon -- Honey Cup -- Nectar Teaspoon -- Nectar Cup -- Nectar Straw -- Thin Teaspoon -- Thin Cup -- Thin Straw -- Puree -- Mechanical Soft -- Regular -- Multi-consistency -- Pill -- Cervical Esophageal Comment -- No flowsheet data found. Royce Macadamia 11/25/2015, 2:23 PM Breck Coons Lonell Face.Ed CCC-SLP Pager [phone]              Assessment/Plan:  Active Problems: Acute respiratory failure (HCC) Healthcare-associated pneumonia Renal failure Sepsis (HCC) Renal failure (ARF), acute on chronic (HCC) Endotracheal tube present Dyspnea Malnutrition of moderate degree Elevated liver function tests Persistent atrial fibrillation (HCC) CAD in native artery  PAF/Afib with RVR: He has converted to NSR with amiodarone now orally at 200 mg bid and lopressor 25 mg q 8 hrs. Just on ASA with fall history, dementia and ETOH.  Cardizem stopped due to mild LV dysfunction with EF 45% on echo (improved from 35-40%). Digoxin stopped due to bradycardia.  Had seizure yesterday and was given given several doses of Ativan/Keppra and phenytoin and then became hypotensive 76/35mmHg with HR in the 40's.  Now unresponsive and did not get heart meds this am.  HR in low 40's. Will stop BB and Amio.   Elevated troponin: troponin peaked at 0.59-->0.26-->0.19-->0.15. Likely demand ischemia in the setting of known CAD, acute respiratory failure and AKI. He has not complained of any chest pain. Not a great candidate for further ischemic with  dementia, alcoholism and non compliance. ASA 81 mg started yesterday. On sq heparin.   HTN: Had hypotension after Ativan/Keppra and phenytoin given for seizure.  BP now normalized.  Off BP meds for now as patient is unresponsive.  AKI: creat improved from 5.13-->1.45 -->1.26 (  s/p B BKA so Cr alone not a good estimate of Cr Clearance).  Elevated LFTs: Markedly elevated AST/ALT at 590/1191 on 9/17 but improved now 42/349; will need to monitor closely with amiodarone. Repeat LFT yesterday improving 21/199.  Mild AS with mild to moderate AR by echo yesterday    Ischemic DCM - EF improved from 35-40% now at 45%.  Stopped BB due to bradycardia. .  Not on ACE/ARB due to Acute kidney injury on admission. No evidence of CHF on exam.  Seizure - ? Etiology possible scar from old CVA.  Neuro following.  Family does not want any heroic measures.  He is DNR and has been unresponsive since the seizure.  Family does not want patient transferred back to ICU.       Armanda Magic, MD  11/29/2015  10:35 AM

## 2015-11-29 NOTE — Progress Notes (Signed)
Subjective: No significant changes overnight  Exam: Vitals:   11/29/15 0516 11/29/15 0950  BP: (!) 135/42 (!) 137/39  Pulse: 86 96  Resp: 15 18  Temp: 98.7 F (37.1 C) 97.9 F (36.6 C)   Gen: In bed, NAD Resp: non-labored breathing, no acute distress Abd: soft, nt  Neuro: MS: Diffuse eyes tightly closed, does not follow commands CN: Pupils equal and reactive, eyes are midline Motor: He moves his right arm and leg significantly more than yesterday, withdrawing them to noxious stimuli, but still has a clear right hemiparesis compared to the left which he moves spontaneously Sensory: Responds to noxious stimuli in all 4 extremities  Pertinent Labs: Glucose-120  Impression: 75 year old male with a history of cortical infarct 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 Due to his lack of improvement, an EEG was performed today which did now show ongoing seizure. He may be slow to recover due to the prolonged nature as well as the presence of structural lesion.  Recommendations: 1) continue Keppra 1 g twice a day 2) continue Dilantin 100 mg 3 times a day 3) Dilantin level in the a.m.  Ritta SlotMcNeill Hinley Brimage, MD [facility] [phone]  If 7pm- 7am, please page neurology on call as listed in AMION.

## 2015-11-29 NOTE — Clinical Social Work Note (Signed)
Clinical Social Worker continuing to follow patient and family for support and discharge planning needs.  Patient with medical decline overnight and no family available at bedside.  Patient having stat EEG.  Will update [facility] of patient current medical status.  CSW remains available for support to patient and family.  Macario GoldsJesse Ashwika Freels, [place] [phone]

## 2015-11-30 DIAGNOSIS — I639 Cerebral infarction, unspecified: Secondary | ICD-10-CM

## 2015-11-30 LAB — BASIC METABOLIC PANEL
ANION GAP: 5 (ref 5–15)
BUN: 34 mg/dL — ABNORMAL HIGH (ref 6–20)
CHLORIDE: 112 mmol/L — AB (ref 101–111)
CO2: 35 mmol/L — AB (ref 22–32)
Calcium: 8.2 mg/dL — ABNORMAL LOW (ref 8.9–10.3)
Creatinine, Ser: 0.88 mg/dL (ref 0.61–1.24)
GFR calc Af Amer: >60 mL/min (ref >60)
GFR calc non Af Amer: >60 mL/min (ref >60)
GLUCOSE: 70 mg/dL (ref 65–99)
POTASSIUM: 3.5 mmol/L (ref 3.5–5.1)
Sodium: 152 mmol/L — ABNORMAL HIGH (ref 135–145)

## 2015-11-30 LAB — GLUCOSE, CAPILLARY
GLUCOSE-CAPILLARY: 92 mg/dL (ref 65–99)
GLUCOSE-CAPILLARY: 129 mg/dL — AB (ref 65–99)
GLUCOSE-CAPILLARY: 180 mg/dL — AB (ref 65–99)
Glucose-Capillary: 76 mg/dL (ref 65–99)
Glucose-Capillary: 96 mg/dL (ref 65–99)

## 2015-11-30 LAB — CBC
HEMATOCRIT: 40.8 % (ref 39.0–52.0)
Hemoglobin: 12.6 g/dL — ABNORMAL LOW (ref 13.0–17.0)
MCH: 31.3 pg (ref 26.0–34.0)
MCHC: 30.9 g/dL (ref 30.0–36.0)
MCV: 101.5 fL — AB (ref 78.0–100.0)
Platelets: 68 10*3/uL — ABNORMAL LOW (ref 150–400)
RBC: 4.02 MIL/uL — AB (ref 4.22–5.81)
RDW: 14.6 % (ref 11.5–15.5)
WBC: 12.2 10*3/uL — AB (ref 4.0–10.5)

## 2015-11-30 LAB — PHENYTOIN LEVEL, TOTAL: Phenytoin Lvl: 24 ug/mL — ABNORMAL HIGH (ref 10.0–20.0)

## 2015-11-30 MED ORDER — DEXTROSE 5 % IV SOLN
INTRAVENOUS | Status: DC
  Administered 2015-11-30: 11:00:00 via INTRAVENOUS

## 2015-11-30 NOTE — Progress Notes (Signed)
Neurology Progress Note  Subjective: No further seizure activity reported. He is a bit more responsive this morning but still nonverbal and not following commands. He is not able to participate with the ROS as a result.   Current Meds:   Current Facility-Administered Medications:  .  0.9 %  sodium chloride infusion, 250 mL, Intravenous, PRN, Oretha Milchakesh V Alva, MD, Last Rate: 10 mL/hr at 11/27/15 1141, 250 mL at 11/27/15 1141 .  acetaminophen (TYLENOL) suppository 650 mg, 650 mg, Rectal, Q6H PRN, Louann SjogrenJose Angelo A de Dios, MD, 650 mg at 11/23/15 2201 .  acetaminophen (TYLENOL) tablet 650 mg, 650 mg, Oral, Q4H PRN, Oretha Milchakesh V Alva, MD .  albuterol (PROVENTIL) (2.5 MG/3ML) 0.083% nebulizer solution 5 mg, 5 mg, Nebulization, Once, Maia PlanJoshua G Long, MD .  amLODipine (NORVASC) tablet 5 mg, 5 mg, Oral, Daily, Quintella Reichertraci R Turner, MD, 5 mg at 11/28/15 1019 .  aspirin EC tablet 81 mg, 81 mg, Oral, Daily, Lennette Biharihomas A Kelly, MD, 81 mg at 11/28/15 1013 .  budesonide (PULMICORT) nebulizer solution 0.5 mg, 0.5 mg, Nebulization, BID, Jose Angelo A Ferndalede Dios, MD, 0.5 mg at 11/29/15 2030 .  donepezil (ARICEPT) tablet 10 mg, 10 mg, Oral, QHS, Costin Otelia SergeantM Gherghe, MD .  famotidine (PEPCID) tablet 20 mg, 20 mg, Oral, QHS, Maretta BeesShanker M Ghimire, MD, 20 mg at 11/27/15 2122 .  feeding supplement (ENSURE ENLIVE) (ENSURE ENLIVE) liquid 237 mL, 237 mL, Oral, BID BM, Costin Otelia SergeantM Gherghe, MD .  feeding supplement (PRO-STAT SUGAR FREE 64) liquid 30 mL, 30 mL, Oral, TID, Maretta BeesShanker M Ghimire, MD, 30 mL at 11/28/15 1014 .  fentaNYL (SUBLIMAZE) injection 50 mcg, 50 mcg, Intravenous, Q2H PRN, Oretha Milchakesh V Alva, MD, 50 mcg at 11/22/15 0412 .  heparin injection 5,000 Units, 5,000 Units, Subcutaneous, Q8H, Oretha Milchakesh V Alva, MD, 5,000 Units at 11/30/15 0622 .  hydrALAZINE (APRESOLINE) injection 5 mg, 5 mg, Intravenous, Q4H PRN, Leatha Gildingostin M Gherghe, MD .  insulin aspart (novoLOG) injection 0-9 Units, 0-9 Units, Subcutaneous, Q4H, Roslynn AmbleJennings E Nestor, MD, 1 Units at 11/29/15  1201 .  insulin glargine (LANTUS) injection 8 Units, 8 Units, Subcutaneous, Daily, Tasrif Ahmed, MD, 8 Units at 11/29/15 1039 .  ipratropium-albuterol (DUONEB) 0.5-2.5 (3) MG/3ML nebulizer solution 3 mL, 3 mL, Nebulization, TID, Nelda Bucksaniel J Feinstein, MD, 3 mL at 11/29/15 2030 .  levETIRAcetam (KEPPRA) 1,000 mg in sodium chloride 0.9 % 100 mL IVPB, 1,000 mg, Intravenous, Q12H, Rejeana BrockMcNeill P Kirkpatrick, MD, 1,000 mg at 11/30/15 0415 .  methylPREDNISolone sodium succinate (SOLU-MEDROL) 40 mg/mL injection 40 mg, 40 mg, Intravenous, Q12H, Maretta BeesShanker M Ghimire, MD, 40 mg at 11/30/15 0622 .  phenytoin (DILANTIN) injection 100 mg, 100 mg, Intravenous, Q8H, Leatha Gildingostin M Gherghe, MD, 100 mg at 11/30/15 0620  Objective:  Temp:  [97.9 F (36.6 C)-98.3 F (36.8 C)] 98.3 F (36.8 C) (09/25 0443) Pulse Rate:  [41-96] 41 (09/25 0443) Resp:  [18] 18 (09/24 2104) BP: (128-137)/(39-66) 131/51 (09/25 0443) SpO2:  [86 %-100 %] 98 % (09/25 0443) Weight:  [63.9 kg (140 lb 14 oz)] 63.9 kg (140 lb 14 oz) (09/24 2104)  General: Thin chronically-ill-appearing male lying in bed, non-rebreather in place. He will open his eyes to voice and appears to briefly attend to me but then closes his eyes again. He does not follow commands or participate with the exam. He moans at times but is nonverbal.   HEENT: Neck is supple without lymphadenopathy. Sclerae are anicteric. There is mild conjunctival injection. He does not open his mouth so  I am unable to assess his oropharynx. CV: Heart sounds obscured by coarse overlying breath sounds. Carotid pulses are 2+ and symmetric with no bruits.  Lungs: Coarse rhonchi noted diffusely on anterior auscultation.  Extremities: He is s/p BLE amputations. No C/C/E. Neuro: MS: As noted above. CN: Pupils are equal and reactive from 2-->1 mm bilaterally. Eyes are conjugate. Corneals are intact bilaterally. He resists passive eye opening with good strength bilaterally. He has slightly asymmetric grimace on  the right side. The remainder of his cranial nerves cannot be accurately assessed as he does not participate with the exam.  Motor: Bulk is reduced throughout. He is moving all extremities spontaneously with slightly less movement of the RUE observed. No tremor or other abnormal movements are observed.  Sensation: He grimaces and withdraws from noxious stimulation x4.  DTRs: 2+, symmetric in the UEs.   Coordination and gait: These cannot be assessed as the patient is unable to participate with the exam. Also, he is s/p BLE amputations.   Labs: Lab Results  Component Value Date   WBC 6.9 11/28/2015   HGB 13.9 11/28/2015   HCT 43.5 11/28/2015   PLT 142 (L) 11/28/2015   GLUCOSE 243 (H) 11/28/2015   CHOL  12/03/2009    83        ATP III CLASSIFICATION:  <200     mg/dL   Desirable  811-914  mg/dL   Borderline High  >=782    mg/dL   High          TRIG 50 12/03/2009   HDL 34 (L) 12/03/2009   LDLCALC  12/03/2009    39        Total Cholesterol/HDL:CHD Risk Coronary Heart Disease Risk Table                     Men   Women  1/2 Average Risk   3.4   3.3  Average Risk       5.0   4.4  2 X Average Risk   9.6   7.1  3 X Average Risk  23.4   11.0        Use the calculated Patient Ratio above and the CHD Risk Table to determine the patient's CHD Risk.        ATP III CLASSIFICATION (LDL):  <100     mg/dL   Optimal  956-213  mg/dL   Near or Above                    Optimal  130-159  mg/dL   Borderline  086-578  mg/dL   High  >469     mg/dL   Very High   ALT 629 (H) 11/28/2015   AST 21 11/28/2015   NA 147 (H) 11/28/2015   K 3.4 (L) 11/28/2015   CL 104 11/28/2015   CREATININE 1.08 11/28/2015   BUN 37 (H) 11/28/2015   CO2 33 (H) 11/28/2015   TSH 0.457 11/24/2015   INR 1.10 09/14/2010   HGBA1C 6.3 (H) 11/22/2015   CBC Latest Ref Rng & Units 11/28/2015 11/26/2015 11/23/2015  WBC 4.0 - 10.5 K/uL 6.9 9.9 10.5  Hemoglobin 13.0 - 17.0 g/dL 52.8 41.3 12.6(L)  Hematocrit 39.0 - 52.0 % 43.5  45.2 38.9(L)  Platelets 150 - 400 K/uL 142(L) 125(L) 121(L)    Lab Results  Component Value Date   HGBA1C 6.3 (H) 11/22/2015   Lab Results  Component Value Date   ALT 199 (H)  11/28/2015   AST 21 11/28/2015   ALKPHOS 67 11/28/2015   BILITOT 1.6 (H) 11/28/2015    Radiology:   I have personally and independently reviewed the MRI brain without contrast from 11/28/15. There is a small area of restricted diffusion in the right precentral gyrus suggestive of acute ischemia. Extensive T2/FLAIR hyperintensity is noted in the bihemispheric white matter, bilateral thalami, and bilateral pons that is most consistent with chronic small vessel ischemic disease. A similar distribution of abnormal signal was noted on a previous MRI on 06/24/11 but this is more advanced on the current scan. Focal encephalomalacia is noted in the left frontal lobe, consistent with previous infarct.   Other diagnostic studies:  EEG 11/29/15 showed moderate diffuse generalized slowing consistent with a global encephalopathy, no seizures.    TTE 11/27/15 showed moderate LVH; EF 45-50%; grade 2 diastolic dysfunction; mild aortic stenosis; mild to moderate aortic regurgitation; calcified mitral valve annulus with mild MR; mild dilation of the left atrium   A/P:   1. 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 He has not had any witnessed seizure activity since 11/28/15. Possible etiologies of his seizure include underlying chronic cerebrovascular disease (the acute ischemia noted in the right precentral gyrus would not explain reported focal right-sided seizure) and metabolic derangement (uremia, respiratory failure). Continue phenytoin and Keppra for now. Will need to watch him closely on Keppra as this can result in increased irritability, agitation, and possibly aggressive behavior in patients with dementia. Continue seizure precautions.   2. Ischemic stroke: I suspect the diffusion  abnormality in the right precentral gyrus represents an acute ischemic stroke. As noted above, this does not explain focal right-sided seizures. It is not clear when in the course of his illness he suffered this stroke. Known risk factors for cerebrovascular disease in this patient include afib (not on anticoagulation), CAD, DM, dyslipidemia, HTN, ongoing tobacco abuse, age, and prior stroke. Continue aspirin. Avoid hypoxia. Gradually tighten BP control as needed to goal SBP less than 120 mmHg. He was not on a statin but would consider adding one given his vascular history and the extent of small vessel disease on his MRI if there are no compelling reasons for statins to be avoided. TTE did not reveal an embolic source. Could consider carotid Dopplers to complete workup but he is not a good candidate for intervention and would favor medical management regardless of results.   3. Acute encephalopathy: This is likely multifactorial in nature with contributions from acute hypoxic respiratory failure, acute-on-chronic renal failure, acute hepatic dysfunction, and persistent seizure. All of these are superimposed upon poor cerebral reserve and background dementia which predisposes him to encephalopathy and delirium from all causes and predicts delayed and perhaps incomplete recovery from same. Treatment is supportive. Continue to optimize metabolic status as you are. Limit sedating medications, particularly opiates, benzos, and anything with strong anticholinergic properties.   4. Dementia: This is reported by history. 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 This predisposes him to delirium and encephalopathy from all causes. He may take awhile to clear and his encephalopathy could persist beyond correction of all metabolic and other acute issues. Continue donepezil.   This was briefly discussed with Dr. Elvera Lennox at the bedside. No  family was present.   Rhona Leavens, MD [facility]

## 2015-11-30 NOTE — Progress Notes (Signed)
PROGRESS NOTE  GARRIS MELHORN ZOX:[id number] DOB: 01/02/1941 DOA: 2015/12/15 PCP: Londell Moh, MD   LOS: 9 days   Brief Narrative: Patient is a 75 y.o. male resident of a skilled nursing facility, history of COPD, CAD status post CABG (2003), PAD status post bilateral BKA, dementia, paroxysmal atrial fibrillation no longer on anticoagulation admitted to the intensive care unit on 9/16 with acute hypercarbic respiratory failure likely from acute exacerbation of COPD. Initially started on BiPAP, however required intubation post admission. He was managed with empiric antibiotics and other supportive measures, and subsequently extubated on 9/18. Hospital course has also been complicated by development of atrial fibrillation with RVR requiring IV digoxin and IV amiodarone infusion, transaminitis and acute renal failure. Patient was transferred to the [facility] on [facility]/20.   Assessment & Plan: Active Problems:   Acute respiratory failure (HCC)   Healthcare-associated pneumonia   Renal failure   Sepsis (HCC)   Renal failure (ARF), acute on chronic (HCC)   Endotracheal tube present   Dyspnea   Malnutrition of moderate degree   Elevated liver function tests   Persistent atrial fibrillation (HCC)   CAD in native artery   PAF (paroxysmal atrial fibrillation) (HCC)   Seizure (HCC)   Seizure activity - see 9/23 notes, new onset seizures, has been poorly responsive since. No longer seizing. - responding some to me this morning, able to grimace.   HTN  - With Concern for PRESS on MRI - patient with HTN and occasionally 170s systolic. In a chronic vasculopath and long standing HTN I doubt that this BP can cause PRESS but may be an explanation for his seizures. - BP currently controlled  Hypernatremia - due to poor po intake - D5 water, monitor  Acute hypercarbic respiratory failure due to COPD exacerbation - Improved, extubated on 9/18. Secondary to COPD  exacerbation. - complicated by #1 now, likely aspirated during seizure. On NRB. Family confirmed DNR 9/23  Severe sepsis secondary to Healthcare associated pneumonia  - Improving-sepsis pathophysiology has resolved, patient is now afebrile and without any leukocytosis.  Acute metabolic encephalopathy - Probably secondary to sepsis and hypercarbia - now obtunded   Atrial fibrillation with RVR  - Initially required rate control with with IV amiodarone, IV digoxin and IV Lopressor. Patient is now back in sinus rhythm. Cardiology following. - now bradycardcic  Mildly elevated troponin - Trend is flat and not consistent with ACS. Suspect more secondary to demand ischemia. Continue Aspirin when able to take po   Acute on chronic renal disease stage III  - Acute renal failure secondary secondary to ATN. Creatinine improving with supportive care. Nephrology has signed off.  - Recommendations are not to restart ACEs/ARB  Dysphagia - keep NPO  Transaminitis - likely secondary to sepsis or from EtOH/statin use. Acute hepatitis serology negative. LFT's downtrending. Avoid hepatotoxic medications.  Thrombocytopenia - Mild, suspect secondary to sepsis  HTN - suspect crhonically elevated  Type 2 diabetes - CBGs elevated-as patient on steroids-continue 8 units of Lantus and SSI.  History of CAD status post CABG  - continue Aspirin - Statins on hold due to transaminitis.  History of PAD status post bilateral BKA  AAA  - distal, measured 3 cm x 3 xcm in 2014  Dementia with mild delirium   GERD: Continue PPI  Malnutrition of moderate degree   DVT prophylaxis: heparin Code Status: DNR Family Communication: no family at bedside today Disposition Plan: TBD  Consultants:   Cardiology   PCCM  Neurology  Procedures:   ETT 9/16 >> 9/18  Right IJ 9/16 >> 9/18  Antimicrobials:  Vancomycin / Cefepime 9/16 >> 9/18  Ceftriaxone 9/18 >>  Plan to stop  9/24  Subjective: - responding more  Objective: Vitals:   11/29/15 2030 11/29/15 2104 11/30/15 0443 11/30/15 0900  BP:  128/66 (!) 131/51 124/68  Pulse:  (!) 55 (!) 41 (!) 52  Resp:  18  20  Temp:  98.2 F (36.8 C) 98.3 F (36.8 C) 98.3 F (36.8 C)  TempSrc:  Axillary Axillary Axillary  SpO2: 94% 99% 98% 98%  Weight:  63.9 kg (140 lb 14 oz)    Height:        Intake/Output Summary (Last 24 hours) at 11/30/15 0929 Last data filed at 11/30/15 0900  Gross per 24 hour  Intake                0 ml  Output             1000 ml  Net            -1000 ml   Filed Weights   11/27/15 0455 11/27/15 2011 11/29/15 2104  Weight: 65.1 kg (143 lb 8.3 oz) 65.4 kg (144 lb 2.9 oz) 63.9 kg (140 lb 14 oz)    Examination: Constitutional: minimally responsive however improved Vitals:   11/29/15 2030 11/29/15 2104 11/30/15 0443 11/30/15 0900  BP:  128/66 (!) 131/51 124/68  Pulse:  (!) 55 (!) 41 (!) 52  Resp:  18  20  Temp:  98.2 F (36.8 C) 98.3 F (36.8 C) 98.3 F (36.8 C)  TempSrc:  Axillary Axillary Axillary  SpO2: 94% 99% 98% 98%  Weight:  63.9 kg (140 lb 14 oz)    Height:       HEENT: PERRL Respiratory: coarse breath sounds bilaterally Cardiovascular: Regular rate and rhythm, no murmurs / rubs / gallops. No LE edema. 2+ pedal pulses.  Abdomen: no tenderness. Bowel sounds positive.  Musculoskeletal: no clubbing / cyanosis. Bilateral BKA Neurologic: obtunded, does not follow commands  Data Reviewed: I have personally reviewed following labs and imaging studies  CBC:  Recent Labs Lab 11/26/15 0246 11/28/15 0343 11/30/15 0635  WBC 9.9 6.9 12.2*  HGB 15.0 13.9 12.6*  HCT 45.2 43.5 40.8  MCV 96.0 98.0 101.5*  PLT 125* 142* 68*   Basic Metabolic Panel:  Recent Labs Lab 11/23/15 1543 11/25/15 0316 11/26/15 0246 11/27/15 1202 11/28/15 0343 11/30/15 0635  NA 141 140 144 147* 147* 152*  K 4.1 4.4 3.9 3.9 3.4* 3.5  CL 111 109 112* 111 104 112*  CO2 23 21* 23 28 33* 35*   GLUCOSE 146* 119* 97 193* 243* 70  BUN 82* 62* 50* 42* 37* 34*  CREATININE 2.03* 1.45* 1.26* 1.17 1.08 0.88  CALCIUM 7.9* 8.7* 8.7* 8.7* 8.9 8.2*  MG 1.9  --   --   --   --   --   PHOS 4.3  --   --   --   --   --    GFR: Estimated Creatinine Clearance: 39.4 mL/min (by C-G formula based on SCr of 0.88 mg/dL). Liver Function Tests:  Recent Labs Lab 11/23/15 1323 11/26/15 0246 11/27/15 1202 11/28/15 0343  AST 144* 42* 20 21  ALT 765* 349* 232* 199*  ALKPHOS 73 70 64 67  BILITOT 0.6 0.9 1.1 1.6*  PROT 5.1* 5.3* 5.2* 5.3*  ALBUMIN 2.4* 2.6* 2.7* 2.7*   No results for input(s):  LIPASE, AMYLASE in the last 168 hours. No results for input(s): AMMONIA in the last 168 hours. Coagulation Profile: No results for input(s): INR, PROTIME in the last 168 hours. Cardiac Enzymes:  Recent Labs Lab 11/23/15 1323 11/24/15 0151 11/24/15 0655 11/24/15 1331  TROPONINI 0.26* 0.19* 0.19* 0.15*   BNP (last 3 results) No results for input(s): PROBNP in the last 8760 hours. HbA1C: No results for input(s): HGBA1C in the last 72 hours. CBG:  Recent Labs Lab 11/29/15 1628 11/29/15 2044 11/29/15 2349 11/30/15 0426 11/30/15 0808  GLUCAP 120* 95 85 76 96   Lipid Profile: No results for input(s): CHOL, HDL, LDLCALC, TRIG, CHOLHDL, LDLDIRECT in the last 72 hours. Thyroid Function Tests: No results for input(s): TSH, T4TOTAL, FREET4, T3FREE, THYROIDAB in the last 72 hours. Anemia Panel: No results for input(s): VITAMINB12, FOLATE, FERRITIN, TIBC, IRON, RETICCTPCT in the last 72 hours. Urine analysis:    Component Value Date/Time   COLORURINE YELLOW 11/28/2015 2210   APPEARANCEUR CLOUDY (A) 11/11/2015 2210   LABSPEC 1.020 12/04/2015 2210   PHURINE 5.0 11/14/2015 2210   GLUCOSEU NEGATIVE 11/20/2015 2210   HGBUR NEGATIVE 11/26/2015 2210   BILIRUBINUR SMALL (A) 11/11/2015 2210   KETONESUR NEGATIVE 11/10/2015 2210   PROTEINUR 30 (A) 11/29/2015 2210   UROBILINOGEN 0.2 06/23/2011 1739    NITRITE NEGATIVE 12/05/2015 2210   LEUKOCYTESUR NEGATIVE 11/10/2015 2210   Sepsis Labs: Invalid input(s): PROCALCITONIN, LACTICIDVEN  Recent Results (from the past 240 hour(s))  Blood Culture (routine x 2)     Status: None   Collection Time: 11/26/2015 10:25 AM  Result Value Ref Range Status   Specimen Description BLOOD LEFT ARM  5 ML IN [facility] BOTTLE  Final   Special Requests NONE  Final   Culture   Final    NO GROWTH 5 DAYS Performed at [facility]    Report Status 11/26/2015 FINAL  Final  Blood Culture (routine x 2)     Status: None   Collection Time: 11/16/2015 11:10 AM  Result Value Ref Range Status   Specimen Description BLOOD RIGHT FOREARM  5 ML IN [facility] ONLY  Final   Special Requests NONE  Final   Culture   Final    NO GROWTH 5 DAYS Performed at [facility]    Report Status 11/26/2015 FINAL  Final  MRSA PCR Screening     Status: None   Collection Time: 11/06/2015  6:58 PM  Result Value Ref Range Status   MRSA by PCR NEGATIVE NEGATIVE Final    Comment:        The GeneXpert MRSA Assay (FDA approved for NASAL specimens only), is one component of a comprehensive MRSA colonization surveillance program. It is not intended to diagnose MRSA infection nor to guide or monitor treatment for MRSA infections.   Urine culture     Status: None   Collection Time: 11/22/2015 10:10 PM  Result Value Ref Range Status   Specimen Description URINE, RANDOM  Final   Special Requests NONE  Final   Culture NO GROWTH  Final   Report Status 11/23/2015 FINAL  Final  Culture, respiratory (NON-Expectorated)     Status: None   Collection Time: 11/22/15  9:37 AM  Result Value Ref Range Status   Specimen Description TRACHEAL ASPIRATE  Final   Special Requests NONE  Final   Gram Stain   Final    ABUNDANT WBC PRESENT, PREDOMINANTLY PMN RARE GRAM POSITIVE COCCI IN PAIRS    Culture Consistent with normal  respiratory flora.  Final   Report Status 11/25/2015 FINAL  Final       Radiology Studies: Ct Head Wo Contrast  Result Date: 11/28/2015 CLINICAL DATA:  Syncope with questionable seizure. EXAM: CT HEAD WITHOUT CONTRAST TECHNIQUE: Contiguous axial images were obtained from the base of the skull through the vertex without intravenous contrast. COMPARISON:  Head CT June 23, 2011 and brain MRI June 24, 2011 FINDINGS: Brain: Mild diffuse atrophy is stable. There is no intracranial mass, hemorrhage, extra-axial fluid collection, midline shift. There is a prior left frontal lobe infarct with encephalomalacia in this area, stable. There is extensive small vessel disease throughout the centra semiovale bilaterally. Small vessel disease is noted throughout much of the left external capsule. There is small vessel disease in each thalamus. No acute appearing infarct is evident. Vascular: No hypervascular lesions are evident. There is extensive carotid siphon calcification bilaterally. There is a focal area of calcification in the distal left vertebral artery. Foci of calcification are also noted in the carotid artery slightly proximal to the siphon on each side. Skull: The bony calvarium appears intact. Sinuses/Orbits: Orbits appear symmetric bilaterally. Visualized paranasal sinuses are clear. There is leftward deviation of the nasal septum. Other: Mastoid air cells are clear. IMPRESSION: Stable atrophy. Extensive supratentorial small vessel disease. Prior left frontal lobe infarct with encephalomalacia. Small vessel disease in each thalamus and left external capsule, stable. No acute infarct evident. No hemorrhage or mass effect. Extensive arterial vascular calcification noted. Deviated nasal septum noted. Electronically Signed   By: Bretta Bang III M.D.   On: 11/28/2015 16:03   Mr Brain Wo Contrast  Result Date: 11/28/2015 CLINICAL DATA:  Seizures today, RIGHT extremity twitching. History of hypertension, hyperlipidemia, diabetes, stroke. EXAM: MRI HEAD WITHOUT CONTRAST  TECHNIQUE: Multiplanar, multiecho pulse sequences of the brain and surrounding structures were obtained without intravenous contrast. COMPARISON:  CT HEAD November 28, 2015 at 1552 hours and MRI of the brain June 24, 2011 FINDINGS: Most sequences are moderately motion degraded. BRAIN: Tubular reduced diffusion RIGHT frontal lobe pre central gyrus measuring less than 1 cm in transaxial dimension with corresponding low ADC values. No susceptibility artifact to suggest hemorrhage. Faint susceptibility artifact in LEFT frontal lobe encephalomalacia with mild ex vacuo dilatation LEFT lateral ventricle. Moderate ventriculomegaly on the basis of global parenchymal brain volume loss. Old small RIGHT cerebellar infarcts. Confluent supratentorial and infratentorial FLAIR T2 hyperintensities. Superimposed patchy abnormal FLAIR T2 hyperintense signal extending to the parietal occipital cortices and cerebellar hemispheres. Symmetric patchy bright T2 signal in the thalamus. No midline shift, mass effect or masses. No abnormal extra-axial fluid collections. Motion degraded thin slice coronal T2 prohibits evaluation of the hippocampi. VASCULAR: Normal major intracranial vascular flow voids present at skull base. SKULL AND UPPER CERVICAL SPINE: No abnormal sellar expansion. No suspicious calvarial bone marrow signal. Craniocervical junction maintained. SINUSES/ORBITS: The mastoid air-cells and included paranasal sinuses are well-aerated. The included ocular globes and orbital contents are non-suspicious. OTHER: None. IMPRESSION: Acute small RIGHT frontal lobe infarct. 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 These results will be called to the ordering clinician or representative by the Radiologist Assistant, and  communication documented in the PACS or zVision Dashboard. Electronically Signed   By: Awilda Metro M.D.   On: 11/28/2015 23:00   Dg Chest Port 1 View  Result Date: 11/28/2015 CLINICAL DATA:  Hypoxia EXAM: PORTABLE CHEST 1 VIEW COMPARISON:  Chest radiograph from one day prior. FINDINGS: Sternotomy wires appear aligned and intact. Stable cardiomediastinal silhouette with normal heart size and aortic atherosclerosis. No pneumothorax. Stable small bilateral pleural effusions. No overt pulmonary edema. Stable mild bibasilar lung opacities, favor atelectasis. IMPRESSION: 1. Stable small bilateral pleural effusions . 2. Stable mild bibasilar lung opacities, favor atelectasis. 3. Aortic atherosclerosis. Electronically Signed   By: Delbert PhenixJason A Poff M.D.   On: 11/28/2015 16:41     Scheduled Meds: . albuterol  5 mg Nebulization Once  . amLODipine  5 mg Oral Daily  . aspirin EC  81 mg Oral Daily  . budesonide (PULMICORT) nebulizer solution  0.5 mg Nebulization BID  . donepezil  10 mg Oral QHS  . famotidine  20 mg Oral QHS  . feeding supplement (ENSURE ENLIVE)  237 mL Oral BID BM  . feeding supplement (PRO-STAT SUGAR FREE 64)  30 mL Oral TID  . heparin  5,000 Units Subcutaneous Q8H  . insulin aspart  0-9 Units Subcutaneous Q4H  . insulin glargine  8 Units Subcutaneous Daily  . ipratropium-albuterol  3 mL Nebulization TID  . levETIRAcetam  1,000 mg Intravenous Q12H  . methylPREDNISolone (SOLU-MEDROL) injection  40 mg Intravenous Q12H  . phenytoin (DILANTIN) IV  100 mg Intravenous Q8H   Continuous Infusions:    Pamella Pertostin Gherghe, MD, PhD [facility] Pager [phone]  If 7PM-7AM, please contact night-coverage [email or web address] Password [email or web address] 11/30/2015, 9:29 AM

## 2015-11-30 NOTE — Progress Notes (Signed)
Physical Therapy Treatment Patient Details Name: [name] MRN: [id number] DOB: 02-28-1941 Today's Date: 11/30/2015    History of Present Illness 75 year old smoker ETOH abuse, COPD, CVA, dementia, bilateral amputee, CABG, HTN nursing home resident admitted for respiratory distress.Intubated 9/16-9/18. CXR Bilateral pleural effusions with basilar atelectasis or infiltration    PT Comments    Noted overall [name] has been less responsive since the onset of seizures on 9/23 (last PT session was prior to this on 9/20); Noting some response to stimuli of sitting up, and saying his name, playing familiar music; At this point, recommend lift or drawsheet  for OOB; Will continue to follow, and may need to downgrade acute PT goals if decr arousal persists  Follow Up Recommendations  SNF;Supervision/Assistance - 24 hour     Equipment Recommendations  None recommended by PT    Recommendations for Other Services       Precautions / Restrictions Precautions Precautions: Fall Precaution Comments: BIL BKA, currently NRB mask for supplemental O2 Restrictions Weight Bearing Restrictions: No    Mobility  Bed Mobility Overal bed mobility: Needs Assistance;+2 for physical assistance;+ 2 for safety/equipment Bed Mobility: Rolling;Sidelying to Sit;Sit to Supine   Sidelying to sit: Max assist;+2 for physical assistance   Sit to supine: +2 for physical assistance;Max assist   General bed mobility comments: Max assist for all aspects of bed mobility  Transfers                    Ambulation/Gait                 Stairs            Wheelchair Mobility    Modified Rankin (Stroke Patients Only)       Balance     Sitting balance-Leahy Scale: Zero Sitting balance - Comments: Needing at least mod assist for sitting balance and at times max assist; worked on rhythmic weight shifting (to [name]), pt's hands on PT's shoulders (kneeling in front of  him)                            Cognition Arousal/Alertness: Lethargic Behavior During Therapy: Flat affect (decr arousal) Overall Cognitive Status: No family/caregiver present to determine baseline cognitive functioning         Following Commands: Follows one step commands inconsistently       General Comments: Eyes closed majority of session, opened somewhat more with stimulation of sitting upright and oral care performed; noted mouthed a few of the words to [name]'s "Walking After Midnight"    Exercises      General Comments        Pertinent Vitals/Pain Pain Assessment: No/denies pain    Home Living                      Prior Function            PT Goals (current goals can now be found in the care plan section) Acute Rehab PT Goals Patient Stated Goal: none stated PT Goal Formulation: Patient unable to participate in goal setting Time For Goal Achievement: 12/09/15 Potential to Achieve Goals: Fair Progress towards PT goals: Not progressing toward goals - comment (limited by lethargy/decr arousal)    Frequency    Min 3X/week      PT Plan Current plan remains appropriate    Co-evaluation  End of Session Equipment Utilized During Treatment: Oxygen (via NRB mask) Activity Tolerance: Patient limited by lethargy Patient left: in bed;with call bell/phone within reach;with bed alarm set;Other (comment) (bed in semi-chair position)     Time: [id number] PT Time Calculation (min) (ACUTE ONLY): 18 min  Charges:  $Therapeutic Activity: 8-22 mins                    G Codes:      [name], [name] 11/30/2015, 1:09 PM  [name], [place]  Acute Rehabilitation Services Pager [phone] Office [phone]

## 2015-11-30 NOTE — Care Management Important Message (Signed)
Important Message  Patient Details  Name: Bryan BunnellDonald G [name] MRN: [id number] Date of Birth: 05/13/1940   Medicare Important Message Given:  Yes    [name] 11/30/2015, 12:25 PM

## 2015-11-30 NOTE — Progress Notes (Signed)
Pt was able to open eyes and responded to his name once while providing oral care this am. Pt remains lethargic with responses to touch. Dondra SpryMoore, Harce Volden Islee, RN

## 2015-11-30 NOTE — Progress Notes (Signed)
SUBJECTIVE:  Had a seizure on 9/23.  Minimally responsive since. Appears to been related to acute stroke   OBJECTIVE:   Vitals:   Vitals:   11/30/15 0443 11/30/15 0900 11/30/15 0936 11/30/15 0937  BP: (!) 131/51 124/68    Pulse: (!) 41 (!) 52    Resp:  20    Temp: 98.3 F (36.8 C) 98.3 F (36.8 C)    TempSrc: Axillary Axillary    SpO2: 98% 98% 96% 96%  Weight:      Height:       I&O's:    Intake/Output Summary (Last 24 hours) at 11/30/15 1741 Last data filed at 11/30/15 1400  Gross per 24 hour  Intake                0 ml  Output              850 ml  Net             -850 ml   TELEMETRY: Reviewed telemetry pt in sinus bradycardia - 40-50  PHYSICAL EXAM General: Somewhat disheveled appearing gentleman status post bilateral lateral BKA amputee who is lying in bed minimally responsive. Opens eyes partially to loud voice only. No significant per purposeful movements made. Lungs:  He has nonrebreather mask in place. His mouth breather with coarse upper respiratory rhonchi sounds throughout. Very difficult to hear. Heart:   Bradycardic with normal rhythm, normal S1 and S2. Pulses are 2+ & equal. Abdomen: Bowel sounds are positive, abdomen soft and non-tender without masses Msk:  Back normal, normal gait. Normal strength and tone for age. Extremities:   No clubbing, cyanosis or edema.  DP +1 Neuro: unresponsive Psych:  unresponsive   LABS: Basic Metabolic Panel:  Recent Labs  40/98/1109/23/17 0343 11/30/15 0635  NA 147* 152*  K 3.4* 3.5  CL 104 112*  CO2 33* 35*  GLUCOSE 243* 70  BUN 37* 34*  CREATININE 1.08 0.88  CALCIUM 8.9 8.2*   Liver Function Tests:  Recent Labs  11/28/15 0343  AST 21  ALT 199*  ALKPHOS 67  BILITOT 1.6*  PROT 5.3*  ALBUMIN 2.7*   No results for input(s): LIPASE, AMYLASE in the last 72 hours. CBC:  Recent Labs  11/28/15 0343 11/30/15 0635  WBC 6.9 12.2*  HGB 13.9 12.6*  HCT 43.5 40.8  MCV 98.0 101.5*  PLT 142* 68*   Cardiac  Enzymes: No results for input(s): CKTOTAL, CKMB, CKMBINDEX, TROPONINI in the last 72 hours. BNP: Invalid input(s): POCBNP D-Dimer: No results for input(s): DDIMER in the last 72 hours. Hemoglobin A1C: No results for input(s): HGBA1C in the last 72 hours. Fasting Lipid Panel: No results for input(s): CHOL, HDL, LDLCALC, TRIG, CHOLHDL, LDLDIRECT in the last 72 hours. Thyroid Function Tests: No results for input(s): TSH, T4TOTAL, T3FREE, THYROIDAB in the last 72 hours.  Invalid input(s): FREET3 Anemia Panel: No results for input(s): VITAMINB12, FOLATE, FERRITIN, TIBC, IRON, RETICCTPCT in the last 72 hours. Coag Panel:   Lab Results  Component Value Date   INR 1.10 09/14/2010   INR 1.3 07/31/2008   INR 1.2 07/29/2008    RADIOLOGY:  Reviewed  Echo: Mildly reduced EF 45-50%. No RWMA. Gr 2 DD. Mild AS, Mild MR.  Assessment/Plan:  Active Problems: Acute respiratory failure (HCC) Healthcare-associated pneumonia Renal failure Sepsis (HCC) Renal failure (ARF), acute on chronic (HCC) Endotracheal tube present Dyspnea Malnutrition of moderate degree Elevated liver function tests Persistent atrial fibrillation (HCC) CAD in native artery  PAF/Afib with RVR: Originally on IV amiodarone, then converted to by mouth along with beta blocker. Had been on diltiazem which was stopped due to reduced EF. Digoxin also stopped due to bradycardia. Now converted to sinus bradycardia following seizure event. Beta blocker and amiodarone had been discontinued. Heart rates remain in the 40s to 50s.  Was only on aspirin due to his history of falls, dementia and alcohol abuse - concern for A. fib related stroke.  Elevated troponin: troponin peaked at 0.59-->0.26-->0.19-->0.15. Likely demand ischemia in the setting of known CAD, acute respiratory failure and AKI. He has not complained of any chest pain.  Not a great candidate for further ischemic with dementia, alcoholism  and non compliance - and now what appears to be stroke with unresponsiveness after seizure  On ASA 81 mg. On sq heparin.   HTN: Had hypotension after Ativan/Keppra and phenytoin given for seizure.  BP now normalized.  Off BP meds for now as patient is unresponsive.  Has amlodipine 5 mg by mouth ordered, and given  He does have when necessary hydralazine ordered, but not given.  AKI: creat improved from 5.13-->--> 0.88 ( s/p B BKA so Cr alone not a good estimate of Cr Clearance).  Mild AS with mild to moderate AR by echo yesterday ; not likely significant  Ischemic DCM - EF improved from 35-40% now at 45%.  Stopped BB due to bradycardia. .  Not on ACE/ARB due to Acute kidney injury on admission. No evidence of CHF on exam.  Seizure - ? Etiology possible scar from old CVA versus new stroke.  Neuro following.  Family does not want any heroic measures.     He is DNR and has been unresponsive since the seizure.  Family does not want patient transferred back to ICU.    At this point, he seems relatively stable from a cardiac standpoint. Is not having dangerously low heart rates with his level of activity. Would avoid any AV nodal agents at this time unless he goes back in A. fib RVR.  No active cardiology monitoring is being done. We will follow from a distance ensuring that his telemetry shows stable heart rates. Will otherwise sign off unless there are issues.    Bryan Lemma, MD  11/30/2015  5:41 PM

## 2015-12-01 DIAGNOSIS — G9340 Encephalopathy, unspecified: Secondary | ICD-10-CM

## 2015-12-01 DIAGNOSIS — Z515 Encounter for palliative care: Secondary | ICD-10-CM

## 2015-12-01 LAB — BASIC METABOLIC PANEL
Anion gap: 6 (ref 5–15)
BUN: 36 mg/dL — AB (ref 6–20)
CHLORIDE: 104 mmol/L (ref 101–111)
CO2: 35 mmol/L — ABNORMAL HIGH (ref 22–32)
Calcium: 7.9 mg/dL — ABNORMAL LOW (ref 8.9–10.3)
Creatinine, Ser: 1.04 mg/dL (ref 0.61–1.24)
Glucose, Bld: 244 mg/dL — ABNORMAL HIGH (ref 65–99)
POTASSIUM: 3.5 mmol/L (ref 3.5–5.1)
SODIUM: 145 mmol/L (ref 135–145)

## 2015-12-01 LAB — CBC
HCT: 41.4 % (ref 39.0–52.0)
HEMOGLOBIN: 12.5 g/dL — AB (ref 13.0–17.0)
MCH: 31.6 pg (ref 26.0–34.0)
MCHC: 30.2 g/dL (ref 30.0–36.0)
MCV: 104.8 fL — ABNORMAL HIGH (ref 78.0–100.0)
PLATELETS: 131 10*3/uL — AB (ref 150–400)
RBC: 3.95 MIL/uL — AB (ref 4.22–5.81)
RDW: 14.8 % (ref 11.5–15.5)
WBC: 12.9 10*3/uL — AB (ref 4.0–10.5)

## 2015-12-01 LAB — GLUCOSE, CAPILLARY
GLUCOSE-CAPILLARY: 202 mg/dL — AB (ref 65–99)
GLUCOSE-CAPILLARY: 217 mg/dL — AB (ref 65–99)
GLUCOSE-CAPILLARY: 227 mg/dL — AB (ref 65–99)
GLUCOSE-CAPILLARY: 98 mg/dL (ref 65–99)
Glucose-Capillary: 141 mg/dL — ABNORMAL HIGH (ref 65–99)
Glucose-Capillary: 234 mg/dL — ABNORMAL HIGH (ref 65–99)
Glucose-Capillary: 66 mg/dL (ref 65–99)

## 2015-12-01 MED ORDER — SODIUM CHLORIDE 0.9 % IV SOLN
1.5000 g | Freq: Two times a day (BID) | INTRAVENOUS | Status: DC
  Administered 2015-12-01 – 2015-12-02 (×2): 1.5 g via INTRAVENOUS
  Filled 2015-12-01 (×3): qty 1.5

## 2015-12-01 NOTE — Progress Notes (Addendum)
PROGRESS NOTE  [name] ZOX:[id number] DOB: November 01, 1940 DOA: 22-Nov-2015 PCP: Londell Moh, MD   LOS: 10 days   Brief Narrative: Patient is a 75 y.o. male resident of a skilled nursing facility, history of COPD, CAD status post CABG (2003), PAD status post bilateral BKA, dementia, paroxysmal atrial fibrillation no longer on anticoagulation admitted to the intensive care unit on 9/16 with acute hypercarbic respiratory failure likely from acute exacerbation of COPD. Initially started on BiPAP, however required intubation post admission. He was managed with empiric antibiotics and other supportive measures, and subsequently extubated on 9/18. Hospital course has also been complicated by development of atrial fibrillation with RVR requiring IV digoxin and IV amiodarone infusion, transaminitis and acute renal failure. Patient was transferred to the [facility] on 9/20. Shortly after transfer, the next day, patient has had multiple seizure episodes requiring neurology consult and consideration for ICU transfer was given at that time. Goals of care discussions were held with family, and patient was continued DO NOT RESUSCITATE and no escalation of care. He was kept on the floor, with supportive treatment and antiseizure medications.  Assessment & Plan: Active Problems:   Acute respiratory failure (HCC)   Healthcare-associated pneumonia   Renal failure   Sepsis (HCC)   Renal failure (ARF), acute on chronic (HCC)   Endotracheal tube present   Dyspnea   Malnutrition of moderate degree   Elevated liver function tests   Persistent atrial fibrillation (HCC)   CAD in native artery   PAF (paroxysmal atrial fibrillation) (HCC)   Seizure (HCC)   Seizure activity - see 9/23 notes, new onset seizures, has been poorly responsive since. No longer seizing. - Patient unresponsive on 9/24, started to wake up more 09/25, however my evaluation this morning on 9/26 he is again poorly  responsive requiring a nonrebreather.  Acute CVA - Acute small right frontal lobe infarct per MRI, neurology following  Goals of care - Discussed again with patient's daughter who makes medical decisions for him, he seemed to be more interactive yesterday however today he appears to be again poorly responsive. She understands, wishes medical treatment for seizures, however understands that if he is not waking up he would be appropriate for transition to comfort. I consulted palliative care today, appreciate input. I explained to the daughter that I am not sure to what degree he will recover, and one of the most difficult things with the patient's nutritional status given underlying dementia, acute CVA as well as progressive encephalopathy as result of his seizures. Would not recommend artificial feeding, daughter understands, will see how he is progressing over the next day or so and we'll touch base with palliative regardless appropriateness to transition to full comfort and pursue residential hospice placement.  HTN - With Concern for PRESS on MRI - patient with HTN and occasionally 170s systolic. In a chronic vasculopath and long standing HTN I doubt that this BP can cause PRESS but may be an explanation for his seizures. - BP currently controlled  Hypernatremia - due to poor po intake - D5 water, monitor, avoid hyponatremia  Acute hypercarbic respiratory failure due to COPD exacerbation - Improved, extubated on 9/18. Secondary to COPD exacerbation. - complicated by #1 now. On NRB. Family confirmed DNR 9/23  Severe sepsis secondary to Healthcare associated pneumonia  - Improving-sepsis pathophysiology has resolved, patient is now afebrile - WBC increasing following seizure, start Unasyn to cover. Needs to be d/c if transition to comfort  Acute metabolic encephalopathy - Probably  secondary to sepsis and hypercarbia - now obtunded   Atrial fibrillation with RVR  - Initially required  rate control with with IV amiodarone, IV digoxin and IV Lopressor. Patient is now back in sinus rhythm. Cardiology following. - now bradycardcic  Mildly elevated troponin - Trend is flat and not consistent with ACS. Suspect more secondary to demand ischemia.  Acute on chronic renal disease stage III  - Acute renal failure secondary secondary to ATN. Creatinine improving with supportive care. Nephrology has signed off.  - Recommendations are not to restart ACEs/ARB  Dysphagia - keep NPO  Transaminitis - likely secondary to sepsis or from EtOH/statin use. Acute hepatitis serology negative. LFT's downtrending. Avoid hepatotoxic medications.  Thrombocytopenia - Mild, suspect secondary to sepsis  HTN - suspect crhonically elevated  Type 2 diabetes - CBGs elevated-as patient on steroids-continue 8 units of Lantus and SSI.  History of CAD status post CABG  - Statins on hold  History of PAD status post bilateral BKA  AAA  - distal, measured 3 cm x 3 xcm in 2014  Dementia with mild delirium   GERD  Malnutrition of moderate degree   DVT prophylaxis: heparin Code Status: DNR Family Communication: d/w daughter at bedside Disposition Plan: TBD  Consultants:   Cardiology   PCCM  Neurology  Paliative  Procedures:   ETT 9/16 >> 9/18  Right IJ 9/16 >> 9/18  Antimicrobials:  Vancomycin / Cefepime 9/16 >> 9/18  Ceftriaxone 9/18 >>  Plan to stop 9/24  Subjective: - unresponsive today  Objective: Vitals:   12/01/15 0359 12/01/15 0909 12/01/15 0939 12/01/15 0942  BP: (!) 148/49 (!) 142/34    Pulse: (!) 53     Resp: 18 11    Temp: 98.1 F (36.7 C) 97.9 F (36.6 C)    TempSrc: Axillary Axillary    SpO2: 98% 98% 98% 98%  Weight:      Height:        Intake/Output Summary (Last 24 hours) at 12/01/15 1119 Last data filed at 12/01/15 0909  Gross per 24 hour  Intake                0 ml  Output              975 ml  Net             -975 ml   Filed  Weights   11/27/15 0455 11/27/15 2011 11/29/15 2104  Weight: 65.1 kg (143 lb 8.3 oz) 65.4 kg (144 lb 2.9 oz) 63.9 kg (140 lb 14 oz)    Examination: Constitutional: obtunded Vitals:   12/01/15 0359 12/01/15 0909 12/01/15 0939 12/01/15 0942  BP: (!) 148/49 (!) 142/34    Pulse: (!) 53     Resp: 18 11    Temp: 98.1 F (36.7 C) 97.9 F (36.6 C)    TempSrc: Axillary Axillary    SpO2: 98% 98% 98% 98%  Weight:      Height:       HEENT: PERRL Respiratory: coarse breath sounds bilaterally Cardiovascular: Regular rate and rhythm, no murmurs / rubs / gallops. No LE edema. 2+ pedal pulses.  Abdomen: no tenderness. Bowel sounds positive.  Musculoskeletal: no clubbing / cyanosis. Bilateral BKA Neurologic: obtunded, does not follow commands  Data Reviewed: I have personally reviewed following labs and imaging studies  CBC:  Recent Labs Lab 11/26/15 0246 11/28/15 0343 11/30/15 0635 12/01/15 0427  WBC 9.9 6.9 12.2* 12.9*  HGB 15.0 13.9 12.6* 12.5*  HCT  45.2 43.5 40.8 41.4  MCV 96.0 98.0 101.5* 104.8*  PLT 125* 142* 68* 131*   Basic Metabolic Panel:  Recent Labs Lab 11/26/15 0246 11/27/15 1202 11/28/15 0343 11/30/15 0635 12/01/15 0427  NA 144 147* 147* 152* 145  K 3.9 3.9 3.4* 3.5 3.5  CL 112* 111 104 112* 104  CO2 23 28 33* 35* 35*  GLUCOSE 97 193* 243* 70 244*  BUN 50* 42* 37* 34* 36*  CREATININE 1.26* 1.17 1.08 0.88 1.04  CALCIUM 8.7* 8.7* 8.9 8.2* 7.9*   GFR: Estimated Creatinine Clearance: 33.3 mL/min (by C-G formula based on SCr of 1.04 mg/dL). Liver Function Tests:  Recent Labs Lab 11/26/15 0246 11/27/15 1202 11/28/15 0343  AST 42* 20 21  ALT 349* 232* 199*  ALKPHOS 70 64 67  BILITOT 0.9 1.1 1.6*  PROT 5.3* 5.2* 5.3*  ALBUMIN 2.6* 2.7* 2.7*   No results for input(s): LIPASE, AMYLASE in the last 168 hours. No results for input(s): AMMONIA in the last 168 hours. Coagulation Profile: No results for input(s): INR, PROTIME in the last 168 hours. Cardiac  Enzymes:  Recent Labs Lab 11/24/15 1331  TROPONINI 0.15*   BNP (last 3 results) No results for input(s): PROBNP in the last 8760 hours. HbA1C: No results for input(s): HGBA1C in the last 72 hours. CBG:  Recent Labs Lab 11/30/15 1737 11/30/15 1951 12/01/15 0011 12/01/15 0357 12/01/15 0736  GLUCAP 129* 180* 202* 217* 234*   Lipid Profile: No results for input(s): CHOL, HDL, LDLCALC, TRIG, CHOLHDL, LDLDIRECT in the last 72 hours. Thyroid Function Tests: No results for input(s): TSH, T4TOTAL, FREET4, T3FREE, THYROIDAB in the last 72 hours. Anemia Panel: No results for input(s): VITAMINB12, FOLATE, FERRITIN, TIBC, IRON, RETICCTPCT in the last 72 hours. Urine analysis:    Component Value Date/Time   COLORURINE YELLOW November 22, 2015 2210   APPEARANCEUR CLOUDY (A) 11/22/15 2210   LABSPEC 1.020 22-Nov-2015 2210   PHURINE 5.0 11-22-15 2210   GLUCOSEU NEGATIVE 11-22-2015 2210   HGBUR NEGATIVE 2015-11-22 2210   BILIRUBINUR SMALL (A) 2015-11-22 2210   KETONESUR NEGATIVE 2015/11/22 2210   PROTEINUR 30 (A) Nov 22, 2015 2210   UROBILINOGEN 0.2 06/23/2011 1739   NITRITE NEGATIVE Nov 22, 2015 2210   LEUKOCYTESUR NEGATIVE 22-Nov-2015 2210   Sepsis Labs: Invalid input(s): PROCALCITONIN, LACTICIDVEN  Recent Results (from the past 240 hour(s))  MRSA PCR Screening     Status: None   Collection Time: 2015-11-22  6:58 PM  Result Value Ref Range Status   MRSA by PCR NEGATIVE NEGATIVE Final    Comment:        The GeneXpert MRSA Assay (FDA approved for NASAL specimens only), is one component of a comprehensive MRSA colonization surveillance program. It is not intended to diagnose MRSA infection nor to guide or monitor treatment for MRSA infections.   Urine culture     Status: None   Collection Time: 11-22-2015 10:10 PM  Result Value Ref Range Status   Specimen Description URINE, RANDOM  Final   Special Requests NONE  Final   Culture NO GROWTH  Final   Report Status 11/23/2015 FINAL   Final  Culture, respiratory (NON-Expectorated)     Status: None   Collection Time: 11/22/15  9:37 AM  Result Value Ref Range Status   Specimen Description TRACHEAL ASPIRATE  Final   Special Requests NONE  Final   Gram Stain   Final    ABUNDANT WBC PRESENT, PREDOMINANTLY PMN RARE GRAM POSITIVE COCCI IN PAIRS    Culture Consistent with  normal respiratory flora.  Final   Report Status 11/25/2015 FINAL  Final      Radiology Studies: No results found.   Scheduled Meds: . albuterol  5 mg Nebulization Once  . amLODipine  5 mg Oral Daily  . aspirin EC  81 mg Oral Daily  . budesonide (PULMICORT) nebulizer solution  0.5 mg Nebulization BID  . donepezil  10 mg Oral QHS  . famotidine  20 mg Oral QHS  . feeding supplement (ENSURE ENLIVE)  237 mL Oral BID BM  . feeding supplement (PRO-STAT SUGAR FREE 64)  30 mL Oral TID  . heparin  5,000 Units Subcutaneous Q8H  . insulin aspart  0-9 Units Subcutaneous Q4H  . insulin glargine  8 Units Subcutaneous Daily  . ipratropium-albuterol  3 mL Nebulization TID  . levETIRAcetam  1,000 mg Intravenous Q12H  . methylPREDNISolone (SOLU-MEDROL) injection  40 mg Intravenous Q12H  . phenytoin (DILANTIN) IV  100 mg Intravenous Q8H   Continuous Infusions: . dextrose 5 % 1,000 mL infusion 75 mL/hr at 11/30/15 1116     Pamella Pert, MD, PhD [facility] Pager [phone]  If 7PM-7AM, please contact night-coverage [email or web address] Password [email or web address] 12/01/2015, 11:19 AM

## 2015-12-01 NOTE — Progress Notes (Signed)
Daughter  Called and updated on change in  resp pattern. Presenting with shallow breaths,more lethargic non verbal  And not responding to verbal stimulation.

## 2015-12-01 NOTE — Consult Note (Signed)
Consultation Note Date: 12/01/2015   Patient Name: [name]  DOB: Aug 05, 1940  MRN: [id number]  Age / Sex: 75 y.o., male  PCP: Merri Brunette, MD Referring Physician: Leatha Gilding, MD  Reason for Consultation: Establishing goals of care  HPI/Patient Profile: 75 y.o. male  with past medical history of COPD (smokes 3 packs/day per daughter), CAD status post CABG (2003), PAD status post bilateral BKA, dementia, paroxysmal atrial fibrillation no longer on anticoagulation, ETOH abuse, from SNF admitted on Nov 24, 2015 with respiratory failure requiring intubation. Developed AF RVR and acute renal failure (resolved) as well as severe prolonged seizure activity on 9/23 and poorly responsive since.   Clinical Assessment and Goals of Care: Mr. Baisley is minimally responsive. He will open eyes and look at me for only a few seconds. Unable to follow commands. I spoke with his daughter, [name], who seems to have good understanding of his serious medical conditions. She has had conversations with Dr. Lafe Garin that she greatly appreciated his candor. She tells me that her father has struggled with alcohol abuse and after his amputation his QOL has greatly suffered as he did not participate in therapy to learn to optimize his functioning and now has to rely on others to care for him. She says that she knows how difficult this is for him. She also says that he was officially diagnosed with dementia ~1 year ago and she received education regarding the trajectory of dementia at that time. She endorses that his QOL is extremely poor and that she knows this will not improve. Able to verbalize that he may be nearing EOL. However, [name] tells me that she does not "want to be selfish" but also doesn't want to hasten him towards EOL. She would like more time to see if he will be able to improve at all. We agree to continue our  conversations over the next couple days. We briefly discussed comfort care and hospice if he does not improve - agree with Dr. Lafe Garin that artificial nutrition would not add to QOL in a patient with multiple comorbitities including advanced dementia. I will follow up with Mr. Rappaport and daughter, [name], again tomorrow.   NEXT OF KIN daughter [name]    SUMMARY OF RECOMMENDATIONS   - Watchful waiting, daughter wishes for more time to allow for any improvement  Code Status/Advance Care Planning:  DNR   Symptom Management:   Currently no symptoms. May need some robinul for congestion.  Seizures controlled per neurology.   Ongoing encephalopathy concerning with no further signs of active seizures.   Palliative Prophylaxis:   Aspiration, Delirium Protocol, Oral Care and Turn Reposition  Additional Recommendations (Limitations, Scope, Preferences):  No Artificial Feeding  Psycho-social/Spiritual:   Desire for further Chaplaincy support:no  Additional Recommendations: Caregiving  Support/Resources, Education on Hospice and Grief/Bereavement Support  Prognosis:   Unless he begins to show some improvement in the next couple days prognosis likely poor and could be days to weeks.   Discharge Planning: To Be Determined  Primary Diagnoses: Present on Admission: . Acute respiratory failure (HCC)   I have reviewed the medical record, interviewed the patient and family, and examined the patient. The following aspects are pertinent.  Past Medical History:  Diagnosis Date  . CHF (congestive heart failure) (HCC)    S/p CABG   . Chronic kidney disease    Baseline Cr appears to be 1.2-1.4   . COPD (chronic obstructive pulmonary disease) (HCC)   . Coronary artery disease   . CVA (cerebral infarction)    CT head 06/2011 with remote left frontal infarct, remote basal ganglia  lacunar infarcts bilaterally  . Dementia   . Diabetes mellitus   . Hyperlipidemia   .  Hypertension   . Myocardial infarction (HCC)   . Occlusion and stenosis of carotid artery without mention of cerebral infarction   . Paroxysmal a-fib (HCC)   . PVD (peripheral vascular disease) (HCC)    S/p bilateral BKA's   . Tobacco abuse    States 2 PPD currently    Social History   Social History  . Marital status: Divorced    Spouse name: N/A  . Number of children: N/A  . Years of education: N/A   Social History Main Topics  . Smoking status: Current Every Day Smoker    Packs/day: 2.00    Years: 55.00    Types: Cigarettes  . Smokeless tobacco: Never Used  . Alcohol use No  . Drug use: No  . Sexual activity: Not Asked   Other Topics Concern  . None   Social History Narrative   Lives at home alone on [address], but son lives two doors down and helps with daily needs. He gets around in a wheelchair. He currently endorses 2 PPD smoking. He is widowed and has a sone and a daughter.    Family History  Problem Relation Age of Onset  . Stroke Mother    Scheduled Meds: . albuterol  5 mg Nebulization Once  . amLODipine  5 mg Oral Daily  . aspirin EC  81 mg Oral Daily  . budesonide (PULMICORT) nebulizer solution  0.5 mg Nebulization BID  . donepezil  10 mg Oral QHS  . famotidine  20 mg Oral QHS  . feeding supplement (ENSURE ENLIVE)  237 mL Oral BID BM  . feeding supplement (PRO-STAT SUGAR FREE 64)  30 mL Oral TID  . heparin  5,000 Units Subcutaneous Q8H  . insulin aspart  0-9 Units Subcutaneous Q4H  . insulin glargine  8 Units Subcutaneous Daily  . ipratropium-albuterol  3 mL Nebulization TID  . levETIRAcetam  1,000 mg Intravenous Q12H  . methylPREDNISolone (SOLU-MEDROL) injection  40 mg Intravenous Q12H  . phenytoin (DILANTIN) IV  100 mg Intravenous Q8H   Continuous Infusions: . dextrose 5 % 1,000 mL infusion 75 mL/hr at 11/30/15 1116   PRN Meds:.acetaminophen, acetaminophen, fentaNYL (SUBLIMAZE) injection, hydrALAZINE Medications Prior to Admission:  Prior  to Admission medications   Medication Sig Start Date End Date Taking? Authorizing Provider  acetaminophen (TYLENOL) 500 MG tablet Take 500 mg by mouth every 4 (four) hours as needed for moderate pain, fever or headache.   Yes Historical Provider, MD  alum & mag hydroxide-simeth (MINTOX) 200-200-20 MG/5ML suspension Take 30 mLs by mouth every 6 (six) hours as needed for indigestion or heartburn.   Yes Historical Provider, MD  amLODipine (NORVASC) 5 MG tablet Take 5 mg by mouth daily.   Yes Historical Provider, MD  atorvastatin (LIPITOR) 20 MG tablet  Take 20 mg by mouth every evening.   Yes Historical Provider, MD  budesonide-formoterol (SYMBICORT) 160-4.5 MCG/ACT inhaler Inhale 2 puffs into the lungs 2 (two) times daily.   Yes Historical Provider, MD  donepezil (ARICEPT) 10 MG tablet Take 10 mg by mouth at bedtime.   Yes Historical Provider, MD  gabapentin (NEURONTIN) 300 MG capsule Take 300 mg by mouth 2 (two) times daily.    Yes Historical Provider, MD  guaifenesin (HUMIBID E) 400 MG TABS Take 400 mg by mouth 2 (two) times daily as needed (congestion). For congestion.    Yes Historical Provider, MD  guaiFENesin (ROBITUSSIN) 100 MG/5ML liquid Take 200 mg by mouth every 6 (six) hours as needed for cough.   Yes Historical Provider, MD  hydrOXYzine (VISTARIL) 25 MG capsule Take 25 mg by mouth at bedtime.   Yes Historical Provider, MD  ipratropium-albuterol (DUONEB) 0.5-2.5 (3) MG/3ML SOLN Take 3 mLs by nebulization 4 (four) times daily.   Yes Historical Provider, MD  loperamide (IMODIUM) 2 MG capsule Take 2 mg by mouth as needed for diarrhea or loose stools.   Yes Historical Provider, MD  Lutein 20 MG CAPS Take 20 mg by mouth daily.   Yes Historical Provider, MD  magnesium hydroxide (MILK OF MAGNESIA) 400 MG/5ML suspension Take 30 mLs by mouth at bedtime as needed for mild constipation.   Yes Historical Provider, MD  mirtazapine (REMERON) 30 MG tablet Take 15 mg by mouth at bedtime.   Yes Historical  Provider, MD  Neomycin-Bacitracin-Polymyxin (TRIPLE ANTIBIOTIC) [phone] OINT Apply 1 application topically daily as needed (wound care).   Yes Historical Provider, MD  olmesartan (BENICAR) 20 MG tablet Take 20 mg by mouth daily.   Yes Historical Provider, MD  Omega-3 Fatty Acids (FISH OIL) 1000 MG CAPS Take 1,000 mg by mouth daily.   Yes Historical Provider, MD  pantoprazole (PROTONIX) 40 MG tablet Take 40 mg by mouth 2 (two) times daily.   Yes Historical Provider, MD   Allergies  Allergen Reactions  . Ibuprofen Other (See Comments)    Can't take because of KIDNEY function  . Iohexol      Desc: PT IS NOT ALLERGIC TO CONTRAST- PT HAS BEEN TOLD BY DR FOX TO NOT BE GIVEN CONTRAST DUE TO RENAL INSUFF.  STEPHANIE DAVIS,RT-RCT., Onset Date: [id number]   . Wellbutrin [Bupropion Hcl]    Review of Systems  Unable to perform ROS: Mental status change    Physical Exam  Constitutional: He appears well-developed.  Cardiovascular: Bradycardia present.   Pulmonary/Chest: No accessory muscle usage. No tachypnea. No respiratory distress. He has rhonchi.  Abdominal: Soft. Normal appearance.  Neurological:  Poorly responsive - could only get him to briefly open eyes but not follow any commands.     Vital Signs: BP (!) 142/34 (BP Location: Right Arm)   Pulse (!) 53   Temp 97.9 F (36.6 C) (Axillary)   Resp 11   Ht 4' (1.219 m)   Wt 63.9 kg (140 lb 14 oz)   SpO2 98%   BMI 42.99 kg/m  Pain Assessment: Faces   Pain Score: 1    SpO2: SpO2: 98 % O2 Device:SpO2: 98 % O2 Flow Rate: .O2 Flow Rate (L/min): 15 L/min  IO: Intake/output summary:  Intake/Output Summary (Last 24 hours) at 12/01/15 1537 Last data filed at 12/01/15 1100  Gross per 24 hour  Intake                0 ml  Output  600 ml  Net             -600 ml    LBM: Last BM Date: 11/27/15 Baseline Weight: Weight: 74.7 kg (164 lb 10.9 oz) Most recent weight: Weight: 63.9 kg (140 lb 14 oz)     Palliative  Assessment/Data: PPS: 10%     Time In: 1500 Time Out: 1600 Time Total: 60min Greater than 50%  of this time was spent counseling and coordinating care related to the above assessment and plan.  Signed by: Ulice BoldParker, Brienna Bass C, NP   Please contact Palliative Medicine Team phone at [phone] for questions and concerns.  For individual provider: See Loretha StaplerAmion

## 2015-12-01 NOTE — Progress Notes (Signed)
Neurology Progress Note  Subjective: He has not had any seizure activity noted since 11/28/15. He remains lethargic and nonverbal but more responsive to tactile and noxious stimulation. No new neurologic issues today. Daughter is present at the bedside and reports that he has had significant cognitive impairment for at least the past couple of years. The patient is unable to participate in ROS.   Current Meds:   Current Facility-Administered Medications:  .  acetaminophen (TYLENOL) suppository 650 mg, 650 mg, Rectal, Q6H PRN, Jose Alexis Frock, MD, 650 mg at 11/23/15 2201 .  acetaminophen (TYLENOL) tablet 650 mg, 650 mg, Oral, Q4H PRN, Oretha Milch, MD .  albuterol (PROVENTIL) (2.5 MG/3ML) 0.083% nebulizer solution 5 mg, 5 mg, Nebulization, Once, Maia Plan, MD .  amLODipine (NORVASC) tablet 5 mg, 5 mg, Oral, Daily, Quintella Reichert, MD, Stopped at 11/30/15 1025 .  aspirin EC tablet 81 mg, 81 mg, Oral, Daily, Lennette Bihari, MD, Stopped at 11/30/15 1026 .  budesonide (PULMICORT) nebulizer solution 0.5 mg, 0.5 mg, Nebulization, BID, Jose Angelo A [place], MD, 0.5 mg at 11/30/15 2027 .  dextrose 5 % 1,000 mL infusion, , Intravenous, Continuous, Leatha Gilding, MD, Last Rate: 75 mL/hr at 11/30/15 1116 .  donepezil (ARICEPT) tablet 10 mg, 10 mg, Oral, QHS, Leatha Gilding, MD, Stopped at 11/30/15 2331 .  famotidine (PEPCID) tablet 20 mg, 20 mg, Oral, QHS, Maretta Bees, MD, 20 mg at 11/27/15 2122 .  feeding supplement (ENSURE ENLIVE) (ENSURE ENLIVE) liquid 237 mL, 237 mL, Oral, BID BM, Costin Otelia Sergeant, MD .  feeding supplement (PRO-STAT SUGAR FREE 64) liquid 30 mL, 30 mL, Oral, TID, Maretta Bees, MD, 30 mL at 11/28/15 1014 .  fentaNYL (SUBLIMAZE) injection 50 mcg, 50 mcg, Intravenous, Q2H PRN, Oretha Milch, MD, 50 mcg at 11/22/15 0412 .  heparin injection 5,000 Units, 5,000 Units, Subcutaneous, Q8H, Oretha Milch, MD, 5,000 Units at 12/01/15 [phone] .  hydrALAZINE (APRESOLINE)  injection 5 mg, 5 mg, Intravenous, Q4H PRN, Costin Otelia Sergeant, MD .  insulin aspart (novoLOG) injection 0-9 Units, 0-9 Units, Subcutaneous, Q4H, Roslynn Amble, MD, 3 Units at 12/01/15 0500 .  insulin glargine (LANTUS) injection 8 Units, 8 Units, Subcutaneous, Daily, Tasrif Ahmed, MD, 8 Units at 11/30/15 1112 .  ipratropium-albuterol (DUONEB) 0.5-2.5 (3) MG/3ML nebulizer solution 3 mL, 3 mL, Nebulization, TID, Nelda Bucks, MD, 3 mL at 11/30/15 2027 .  levETIRAcetam (KEPPRA) 1,000 mg in sodium chloride 0.9 % 100 mL IVPB, 1,000 mg, Intravenous, Q12H, Rejeana Brock, MD, 1,000 mg at 12/01/15 0400 .  methylPREDNISolone sodium succinate (SOLU-MEDROL) 40 mg/mL injection 40 mg, 40 mg, Intravenous, Q12H, Maretta Bees, MD, 40 mg at 12/01/15 0720 .  phenytoin (DILANTIN) injection 100 mg, 100 mg, Intravenous, Q8H, Costin Otelia Sergeant, MD, 100 mg at 12/01/15 0650  Objective:  Temp:  [98.1 F (36.7 C)-98.6 F (37 C)] 98.1 F (36.7 C) (09/26 0359) Pulse Rate:  [47-53] 53 (09/26 0359) Resp:  [14-20] 18 (09/26 0359) BP: (124-148)/(41-68) 148/49 (09/26 0359) SpO2:  [96 %-100 %] 98 % (09/26 0359)  General: Thin chronically-ill-appearing male lying in bed. He will open his eyes to voice and fixes briefly, no tracking. He does not follow commands or participate with the exam.    HEENT: Neck is supple without lymphadenopathy. Sclerae are anicteric. There is mild conjunctival injection. He does not open his mouth so I am unable to assess his oropharynx. CV: Heart sounds  obscured by coarse overlying breath sounds. Carotid pulses are 2+ and symmetric with no bruits.  Lungs: Rhonchi noted diffusely on anterior auscultation.  Extremities: He is s/p BLE amputations. No C/C/E. Neuro: MS: As noted above. CN: Pupils are equal and reactive from 2-->1 mm bilaterally. Eyes are conjugate. Corneals are intact bilaterally. He resists passive eye opening with good strength bilaterally. He has slightly  asymmetric grimace on the right side. The remainder of his cranial nerves cannot be accurately assessed as he does not participate with the exam.  Motor: Bulk is reduced throughout. Minimal spontaneous movement is noted. No tremor or other abnormal movements are observed.  Sensation: He grimaces and withdraws from noxious stimulation x4.  DTRs: 2+, symmetric in the UEs.   Coordination and gait: These cannot be assessed as the patient is unable to participate with the exam. Also, he is s/p BLE amputations.   Labs: Lab Results  Component Value Date   WBC 12.9 (H) 12/01/2015   HGB 12.5 (L) 12/01/2015   HCT 41.4 12/01/2015   PLT 131 (L) 12/01/2015   GLUCOSE 244 (H) 12/01/2015   CHOL  12/03/2009    83        ATP III CLASSIFICATION:  <200     mg/dL   Desirable  161-096  mg/dL   Borderline High  >=045    mg/dL   High          TRIG 50 12/03/2009   HDL 34 (L) 12/03/2009   LDLCALC  12/03/2009    39        Total Cholesterol/HDL:CHD Risk Coronary Heart Disease Risk Table                     Men   Women  1/2 Average Risk   3.4   3.3  Average Risk       5.0   4.4  2 X Average Risk   9.6   7.1  3 X Average Risk  23.4   11.0        Use the calculated Patient Ratio above and the CHD Risk Table to determine the patient's CHD Risk.        ATP III CLASSIFICATION (LDL):  <100     mg/dL   Optimal  409-811  mg/dL   Near or Above                    Optimal  130-159  mg/dL   Borderline  914-782  mg/dL   High  >956     mg/dL   Very High   ALT 213 (H) 11/28/2015   AST 21 11/28/2015   NA 145 12/01/2015   K 3.5 12/01/2015   CL 104 12/01/2015   CREATININE 1.04 12/01/2015   BUN 36 (H) 12/01/2015   CO2 35 (H) 12/01/2015   TSH 0.457 11/24/2015   INR 1.10 09/14/2010   HGBA1C 6.3 (H) 11/22/2015   CBC Latest Ref Rng & Units 12/01/2015 11/30/2015 11/28/2015  WBC 4.0 - 10.5 K/uL 12.9(H) 12.2(H) 6.9  Hemoglobin 13.0 - 17.0 g/dL 12.5(L) 12.6(L) 13.9  Hematocrit 39.0 - 52.0 % 41.4 40.8 43.5   Platelets 150 - 400 K/uL 131(L) 68(L) 142(L)    Lab Results  Component Value Date   HGBA1C 6.3 (H) 11/22/2015   Lab Results  Component Value Date   ALT 199 (H) 11/28/2015   AST 21 11/28/2015   ALKPHOS 67 11/28/2015   BILITOT 1.6 (H) 11/28/2015  Radiology:   There is no new neuroimaging for review.    A/P:   1. 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 Last seizure was on 11/28/15. Possible etiologies of his seizure include underlying chronic cerebrovascular disease (the acute ischemia noted in the right precentral gyrus would not explain reported focal right-sided seizure) and metabolic derangement (uremia, respiratory failure). Continue phenytoin and Keppra for now, watching for behavioral issues on Keppra (this can cause agitation and sometimes aggression in demented patients). Continue seizure precautions.   2. Ischemic stroke: MRI demonstrated an apparent acute ischemic infarct in the right precentral gyrus. Known risk factors for cerebrovascular disease in this patient include afib (not on anticoagulation), CAD, DM, dyslipidemia, HTN, ongoing tobacco abuse, age, and prior stroke. Continue aspirin. Avoid hypoxia. Gradually tighten BP control as needed to goal SBP less than 120 mmHg. He was not on a statin but would consider adding one given his vascular history and the extent of small vessel disease on his MRI if there are no compelling reasons for statins to be avoided. TTE did not reveal an embolic source. Could consider carotid Dopplers to complete workup but he is not a good candidate for intervention and would favor medical management regardless of results.   3. Acute encephalopathy: This is likely multifactorial in nature with contributions from acute hypoxic respiratory failure, acute-on-chronic renal failure, acute hepatic dysfunction, and persistent seizure. All of these are superimposed upon poor cerebral reserve and what sounds like a  significant background dementia which predisposes him to encephalopathy and delirium from all causes and predicts delayed and perhaps incomplete recovery from same. Treatment is supportive. Continue to optimize metabolic status as you are. Limit sedating medications, particularly opiates, benzos, and anything with strong anticholinergic properties.   4. Dementia: Daughter confirms significant cognitive decline over the past couple of years. 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 This predisposes him to delirium and encephalopathy from all causes. He may take awhile to clear and his encephalopathy could persist beyond correction of all metabolic and other acute issues. Continue donepezil.   This was discussed with the patient's daughter and she is in agreement with the plan as noted. She was given the opportunity to ask questions and these were addressed to her satisfaction.    Rhona Leavensimothy Loribeth Katich, MD [facility]

## 2015-12-01 NOTE — Progress Notes (Signed)
Occupational Therapy Treatment Patient Details Name: [name] MRN: [id number] DOB: 1940-04-05 Today's Date: 12/01/2015    History of present illness 75 year old smoker ETOH abuse, COPD, CVA, dementia, bilateral amputee, CABG, HTN nursing home resident admitted for respiratory distress.Intubated 9/16-9/18. CXR Bilateral pleural effusions with basilar atelectasis or infiltration   OT comments  Pt lethargic this session and keeping eyes closed ,ajority of time. Pt required hand over hand assist for simple grooming and UB simulated bathing tasks at bed level. OT will continue to follow acutely  Follow Up Recommendations  SNF    Equipment Recommendations  Hospital bed;Wheelchair cushion (measurements OT);Wheelchair (measurements OT)    Recommendations for Other Services      Precautions / Restrictions Precautions Precautions: Fall Precaution Comments: BIL BKA, currently NRB mask for supplemental O2 Restrictions Weight Bearing Restrictions: No       Mobility Bed Mobility               General bed mobility comments: acitvity at bed level  Transfers                 General transfer comment: did not attempt due to lethargy    Balance                                   ADL Overall ADL's : Needs assistance/impaired     Grooming: Wash/dry face;Moderate assistance;Maximal assistance (hand over hand assist)   Upper Body Bathing: Maximal assistance (simulated, hand over hand assist)                             General ADL Comments: unable to sit EOB for simple ADL tasks this session, pt lethargic and kept eyes closed majority of session                                      Cognition   Behavior During Therapy: [facility] for tasks assessed/performed Overall Cognitive Status: No family/caregiver present to determine baseline cognitive functioning          Following Commands: Follows one step commands inconsistently        General Comments: eyes closed majority of session, hand over hand guiding for tasks    Extremity/Trunk Assessment   generalized weakness                        General Comments  pt pleasant, lethargic    Pertinent Vitals/ Pain       Pain Assessment: No/denies pain                                                          Frequency  Min 2X/week        Progress Toward Goals  OT Goals(current goals can now be found in the care plan section)  Progress towards OT goals: Not progressing toward goals - comment (pt lethargic)  Acute Rehab OT Goals Patient Stated Goal: none stated  Plan Discharge plan remains appropriate  End of Session     Activity Tolerance Patient tolerated treatment well   Patient Left in bed;with call bell/phone within reach;with bed alarm set               -     Charges: OT General Charges $OT Visit: 1 Procedure OT Treatments $Therapeutic Activity: 8-22 mins  Galen ManilaSpencer, Yashua Bracco Jeanette 12/01/2015, 2:40 PM

## 2015-12-01 NOTE — Progress Notes (Signed)
Pharmacy Antibiotic Note  Bryan BunnellDonald G [name] is a 75 y.o. male admitted on 04-02-15 with pneumonia.  Pharmacy has been consulted for Unasyn dosing.  Plan: Unasyn 1.5g IV q12h  Follow clinical progression and palliative care plans Follow renal function and length of therapy  Height: 4' (121.9 cm) Weight: 140 lb 14 oz (63.9 kg) IBW/kg (Calculated) : 22.4  Temp (24hrs), Avg:98.2 F (36.8 C), Min:97.9 F (36.6 C), Max:98.6 F (37 C)   Recent Labs Lab 11/26/15 0246 11/27/15 1202 11/28/15 0343 11/30/15 0635 12/01/15 0427  WBC 9.9  --  6.9 12.2* 12.9*  CREATININE 1.26* 1.17 1.08 0.88 1.04    Estimated Creatinine Clearance: 33.3 mL/min (by C-G formula based on SCr of 1.04 mg/dL).    Allergies  Allergen Reactions  . Ibuprofen Other (See Comments)    Can't take because of KIDNEY function  . Iohexol      Desc: PT IS NOT ALLERGIC TO CONTRAST- PT HAS BEEN TOLD BY DR FOX TO NOT BE GIVEN CONTRAST DUE TO RENAL INSUFF.  STEPHANIE DAVIS,RT-RCT., Onset Date: [id number]   . Wellbutrin [Bupropion Hcl]     Antimicrobials this admission: Vancomycin 9/16 >>9/18 Cefepime 9/16 >> 9/18  Zosyn 9/18 >> 9/18 CTX 9/18 >> 9/22 Unasyn 9/26 >>   Dose adjustments this admission: N/a  Microbiology results: 9/16 UCx: neg 9/16 BCx: neg 9/17 TA: nml flora 9/16 MRSA: NEG  Thank you for allowing pharmacy to be a part of this patient's care.  Coree Riester D. Charnita Trudel, PharmD, BCPS Clinical Pharmacist Pager: [phone] 12/01/2015 3:42 PM

## 2015-12-02 DIAGNOSIS — R569 Unspecified convulsions: Secondary | ICD-10-CM

## 2015-12-02 DIAGNOSIS — Z515 Encounter for palliative care: Secondary | ICD-10-CM

## 2015-12-02 LAB — GLUCOSE, CAPILLARY
Glucose-Capillary: 95 mg/dL (ref 65–99)
Glucose-Capillary: 75 mg/dL (ref 65–99)

## 2015-12-02 MED ORDER — MORPHINE SULFATE 50 MG/ML IV SOLN
1.0000 mg/h | INTRAVENOUS | Status: DC
  Administered 2015-12-02: 1 mg/h via INTRAVENOUS
  Filled 2015-12-02: qty 10
  Filled 2015-12-02: qty 5

## 2015-12-02 MED ORDER — ATROPINE SULFATE 1 % OP SOLN
4.0000 [drp] | OPHTHALMIC | Status: DC | PRN
  Filled 2015-12-02: qty 2

## 2015-12-02 MED ORDER — DEXTROSE 50 % IV SOLN
INTRAVENOUS | Status: AC
  Administered 2015-12-02: 01:00:00
  Filled 2015-12-02: qty 50

## 2015-12-02 MED ORDER — MORPHINE BOLUS VIA INFUSION
1.0000 mg | INTRAVENOUS | Status: DC | PRN
  Filled 2015-12-02: qty 2

## 2015-12-02 MED ORDER — FENTANYL CITRATE (PF) 100 MCG/2ML IJ SOLN: 25.0000 ug | INTRAMUSCULAR | Status: DC | PRN

## 2015-12-02 MED ORDER — HALOPERIDOL 0.5 MG PO TABS
0.5000 mg | ORAL_TABLET | ORAL | Status: DC | PRN
  Filled 2015-12-02: qty 1

## 2015-12-02 MED ORDER — HALOPERIDOL LACTATE 2 MG/ML PO CONC
0.5000 mg | ORAL | Status: DC | PRN
  Filled 2015-12-02: qty 0.3

## 2015-12-02 MED ORDER — DIPHENHYDRAMINE HCL 50 MG/ML IJ SOLN: 12.5000 mg | INTRAMUSCULAR | Status: DC | PRN

## 2015-12-02 MED ORDER — POLYVINYL ALCOHOL 1.4 % OP SOLN
1.0000 [drp] | Freq: Four times a day (QID) | OPHTHALMIC | Status: DC | PRN
  Filled 2015-12-02: qty 15

## 2015-12-02 MED ORDER — BIOTENE DRY MOUTH MT LIQD: 15.0000 mL | OROMUCOSAL | Status: DC | PRN

## 2015-12-02 MED ORDER — MORPHINE BOLUS VIA INFUSION
2.0000 mg | INTRAVENOUS | Status: DC | PRN
  Filled 2015-12-02: qty 2

## 2015-12-02 MED ORDER — HALOPERIDOL LACTATE 5 MG/ML IJ SOLN: 0.5000 mg | INTRAMUSCULAR | Status: DC | PRN

## 2015-12-02 MED ORDER — IPRATROPIUM-ALBUTEROL 0.5-2.5 (3) MG/3ML IN SOLN: 3.0000 mL | RESPIRATORY_TRACT | Status: DC | PRN

## 2015-12-02 MED ORDER — LORAZEPAM 2 MG/ML IJ SOLN: 1.0000 mg | INTRAMUSCULAR | Status: DC | PRN

## 2015-12-02 MED ORDER — GLYCOPYRROLATE 0.2 MG/ML IJ SOLN
0.2000 mg | INTRAMUSCULAR | Status: DC | PRN
  Administered 2015-12-02: 0.2 mg via INTRAVENOUS
  Filled 2015-12-02 (×3): qty 1

## 2015-12-02 NOTE — Progress Notes (Signed)
On assessment, patient is unresponsive.  On non-rebreather mask and sats are in 70's.  MD notified and will be in to assess.  Chest congested.  Cough weak.  +JVD.

## 2015-12-02 NOTE — Progress Notes (Addendum)
PROGRESS NOTE        PATIENT DETAILS Name: [name] Age: 75 y.o. Sex: male Date of Birth: 06/25/1940 Admit Date: 2015/04/06 Admitting Physician Oretha Milchakesh V Alva, MD NWG:NFAOZ,HYQMVHPCP:PHARR,WALTER DAVIDSON, MD  Brief Narrative: Patient is a 75 y.o. male resident of a skilled nursing facility, history of COPD, CAD status post CABG (2003), PAD status post bilateral BKA, dementia, paroxysmal atrial fibrillation no longer on anticoagulation admitted to the intensive care unit on 9/16 with acute hypercarbic respiratory failure likely from acute exacerbation of COPD. Initially started on BiPAP, however required intubation post admission. He was managed with empiric antibiotics and other supportive measures, and subsequently extubated on 9/18. Hospital course has also been complicated by development of atrial fibrillation with RVR requiring IV digoxin and IV amiodarone infusion, transaminitis,acute renal failure and delirium. Patient was transferred to the [facility] service on 9/20. Unforrtunately patient on 9/23, patient developed status epilepticus, neurology was consulted-he was started on antiepileptics. He continued to deteriorate, becoming more encephalopathic-he was subsequently seen by palliative care services, and has  been transitioned to full comfort measures.   Subjective: Hypoxic, unresponsive.   Assessment/Plan: Active Problems: Acute hypercarbic respiratory failure secondary to COPD exacerbation: Initially had improved after extubation on 9/18. Taper of Solu-Medrol. Unfortunately now completely encephalopathic and unresponsive in a setting of status epilepticus. Transitioned to full comfort measures.   Severe sepsis secondary to Healthcare associated pneumonia: Initially improved with resolution of sepsis pathophysiology. Was treated with broad-spectrum antibiotics she completed on 9/24. Culture data negative so far. As noted above,  now completely  encephalopathic and unresponsive in a setting of status epilepticus. Transitioned to full comfort measures.   Status epilepticus: Occurred on 9/23-seen by neurology and started on anti-epileptics-he continued to be encephalopathic after having seizures, and now is completely obtunded and unresponsive. After discussion with family-involvement of palliative care team-he has not been transitioned to full comfort measures.  Acute metabolic encephalopathy: Initially felt secondary to sepsis and hypercarbia, lately with status epilepticus-not sure if encephalopathy related to ongoing seizures. In any event, has been transitioned to comfort measures. Continue antiepileptics for comfort.   Atrial fibrillation with RVR: Occurred while the patient was in the intensive care unit-and required rate control with IV amiodarone, IV digoxin and IV Lopressor. Patient is now back in sinus rhythm. Cardiology was consulted during this hospital stay, is felt to be a poor long-term candidate for anticoagulation even though he had a chads2vasc score of 6. Unfortunately, with continued deterioration having seizures-he has not been transitioned to full comfort measures. He is off all the rate control medications.  Mildly elevated troponin: Trend was flat and not consistent with ACS. Suspect more secondary to demand ischemia. No plans to resume aspirin as he is continued to deteriorate.   Acute on chronic renal disease stage III: Acute renal failure secondary secondary to ATN. Creatinine improved with supportive care. Nephrology is consulted during this hospital stay, recommendations were not to restart ACEs/ARB on discharge-given continued deterioration he has been discontinued off all antihypertensive medications. No further blood work required.   Dysphagia: Initially Nothing by mouth-seen by speech therapy follow-and underwent modified barium swallow on 9/20-started on a puree diet-family was agreeable with starting diet with  known aspiration risk. He is completely understood today-he remains nothing by mouth. He continues to be at aspiration risk due to accumulation of secretions. Full  comfort measures in effect.    Transaminitis:? Etiology-likely secondary to sepsis or from EtOH/statin use. Acute hepatitis serology negative. LFT's downtrending.Transitioned to full comfort measures-no role for further monitoring.  Thrombocytopenia: Mild, suspect secondary to sepsis,Transitioned to full comfort measures-no role for further monitoring.  Type 2 diabetes: CBGs were controlled with Lantus and SSI-since he has been transitioned to full comfort measures, insulin/CBG monitoring discontinued.  History of CAD status post CABG: No role to resume aspirin, beta blockers and statins-comfort care  History of PAD status post bilateral BKA  AAA: distal, measured 3 cm x 3 xcm in 2014-no role for further monitoring-comfort measures in effect  Hx Dementia with delirium: Now persistently encephalopathic/unresponsive in a setting of status epilepticus. 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 Hospital course complicated by atrial fibrillation, delirium and most lately complicated by status epilepticus. Following seizure activity, patient remained encephalopathic, now he is completely unresponsive and hypoxic. Palliative care has been consulted, after discussion with family he has been transitioned to full comfort measures. He currently appears uncomfortable, I will go ahead and start him on a morphine infusion. Expect inpatient that in the next few days. Spoke with daughter at bedside, she is aware of impending demise of her father.  DVT Prophylaxis: Not needed as comfort care  Code Status: DNR  Family  Communication: Daughter at bedside  Disposition Plan: Remain inpatient-suspect inpatient death in the next 24-48 hours  Antimicrobial agents: See below  Procedures: 9/16 rt ij>>>self 9/18 9/16 ETT>>>9/18  CONSULTS:  cardiology and pulmonary/intensive care  Time spent: 25 minutes-Greater than 50% of this time was spent in counseling, explanation of diagnosis, planning of further management, and coordination of care.  MEDICATIONS: Anti-infectives    Start     Dose/Rate Route Frequency Ordered Stop   12/01/15 1600  ampicillin-sulbactam (UNASYN) 1.5 g in sodium chloride 0.9 % 50 mL IVPB  Status:  Discontinued     1.5 g 100 mL/hr over 30 Minutes Intravenous Every 12 hours 12/01/15 1542 12/02/15 0925   11/23/15 1030  cefTRIAXone (ROCEPHIN) 1 g in dextrose 5 % 50 mL IVPB     1 g 100 mL/hr over 30 Minutes Intravenous Every 24 hours 11/23/15 0926 11/28/15 1114   11/23/15 0900  piperacillin-tazobactam (ZOSYN) IVPB 3.375 g  Status:  Discontinued     3.375 g 12.5 mL/hr over 240 Minutes Intravenous Every 8 hours 11/23/15 0802 11/23/15 0926   11/23/15 0900  vancomycin (VANCOCIN) IVPB 750 mg/150 ml premix  Status:  Discontinued     750 mg 150 mL/hr over 60 Minutes Intravenous Every 24 hours 11/23/15 0804 11/23/15 0921   11/23/15 0800  vancomycin (VANCOCIN) IVPB 1000 mg/200 mL premix  Status:  Discontinued     1,000 mg 200 mL/hr over 60 Minutes Intravenous Every 48 hours 12/02/2015 1621 11/23/15 0804   11/22/15 1400  ceFEPIme (MAXIPIME) 1 g in dextrose 5 % 50 mL IVPB  Status:  Discontinued     1 g 100 mL/hr over 30 Minutes Intravenous Every 24 hours 11/07/2015 1621 11/23/15 0800   11/06/2015 1115  ceFEPIme (MAXIPIME) 2 g in dextrose 5 % 50 mL IVPB     2 g 100 mL/hr over 30 Minutes Intravenous  Once 12/02/2015 1103 11/12/2015 1152   11/22/2015 1115  vancomycin (VANCOCIN) IVPB 1000 mg/200 mL premix  1,000 mg 200 mL/hr over 60 Minutes Intravenous  Once Dec 14, 2015 1103 Dec 14, 2015 1257      Scheduled  Meds: . levETIRAcetam  1,000 mg Intravenous Q12H  . phenytoin (DILANTIN) IV  100 mg Intravenous Q8H   Continuous Infusions: . dextrose 5 % 1,000 mL infusion 10 mL/hr at 12/02/15 0942  . morphine     PRN Meds:.acetaminophen, acetaminophen, antiseptic oral rinse, atropine, diphenhydrAMINE, glycopyrrolate, haloperidol **OR** haloperidol **OR** haloperidol lactate, ipratropium-albuterol, LORazepam, morphine, polyvinyl alcohol   PHYSICAL EXAM: Vital signs: Vitals:   12/01/15 2124 12/02/15 0453 12/02/15 0719 12/02/15 0959  BP: (!) 119/47 (!) 107/53  (!) 124/31  Pulse: (!) 46 66    Resp: 16 15  12   Temp: 98 F (36.7 C) 97.7 F (36.5 C)  98 F (36.7 C)  TempSrc: Oral Oral  Axillary  SpO2: 94% 93% (!) 77% 91%  Weight: 63.5 kg (139 lb 15.9 oz)     Height:       Filed Weights   11/27/15 2011 11/29/15 2104 12/01/15 2124  Weight: 65.4 kg (144 lb 2.9 oz) 63.9 kg (140 lb 14 oz) 63.5 kg (139 lb 15.9 oz)   Body mass index is 42.72 kg/m.   General appearance:Unresponsive, 100% nonrebreather mask.  Lungs: Coarse breath sounds all over-transmitted upper airway sounds CVS: S1-S2 regular Abdomen: Soft, nontender Bilateral BKA  I have personally reviewed following labs and imaging studies  LABORATORY DATA: CBC:  Recent Labs Lab 11/26/15 0246 11/28/15 0343 11/30/15 0635 12/01/15 0427  WBC 9.9 6.9 12.2* 12.9*  HGB 15.0 13.9 12.6* 12.5*  HCT 45.2 43.5 40.8 41.4  MCV 96.0 98.0 101.5* 104.8*  PLT 125* 142* 68* 131*    Basic Metabolic Panel:  Recent Labs Lab 11/26/15 0246 11/27/15 1202 11/28/15 0343 11/30/15 0635 12/01/15 0427  NA 144 147* 147* 152* 145  K 3.9 3.9 3.4* 3.5 3.5  CL 112* 111 104 112* 104  CO2 23 28 33* 35* 35*  GLUCOSE 97 193* 243* 70 244*  BUN 50* 42* 37* 34* 36*  CREATININE 1.26* 1.17 1.08 0.88 1.04  CALCIUM 8.7* 8.7* 8.9 8.2* 7.9*    GFR: Estimated Creatinine Clearance: 33.2 mL/min (by C-G formula based on SCr of 1.04 mg/dL).  Liver Function  Tests:  Recent Labs Lab 11/26/15 0246 11/27/15 1202 11/28/15 0343  AST 42* 20 21  ALT 349* 232* 199*  ALKPHOS 70 64 67  BILITOT 0.9 1.1 1.6*  PROT 5.3* 5.2* 5.3*  ALBUMIN 2.6* 2.7* 2.7*   No results for input(s): LIPASE, AMYLASE in the last 168 hours. No results for input(s): AMMONIA in the last 168 hours.  Coagulation Profile: No results for input(s): INR, PROTIME in the last 168 hours.  Cardiac Enzymes: No results for input(s): CKTOTAL, CKMB, CKMBINDEX, TROPONINI in the last 168 hours.  BNP (last 3 results) No results for input(s): PROBNP in the last 8760 hours.  HbA1C: No results for input(s): HGBA1C in the last 72 hours.  CBG:  Recent Labs Lab 12/01/15 1711 12/01/15 2008 12/02/15 0001 12/02/15 0324 12/02/15 0722  GLUCAP 141* 98 66 95 75    Lipid Profile: No results for input(s): CHOL, HDL, LDLCALC, TRIG, CHOLHDL, LDLDIRECT in the last 72 hours.  Thyroid Function Tests: No results for input(s): TSH, T4TOTAL, FREET4, T3FREE, THYROIDAB in the last 72 hours.  Anemia Panel: No results for input(s): VITAMINB12, FOLATE, FERRITIN, TIBC, IRON, RETICCTPCT in the last 72 hours.  Urine analysis:    Component Value Date/Time   COLORURINE YELLOW December 14, 2015 2210  APPEARANCEUR CLOUDY (A) 12-09-2015 2210   LABSPEC 1.020 09-Dec-2015 2210   PHURINE 5.0 12/09/2015 2210   GLUCOSEU NEGATIVE 12/09/15 2210   HGBUR NEGATIVE 2015-12-09 2210   BILIRUBINUR SMALL (A) 12/09/15 2210   KETONESUR NEGATIVE 12/09/15 2210   PROTEINUR 30 (A) 12/09/15 2210   UROBILINOGEN 0.2 06/23/2011 1739   NITRITE NEGATIVE 2015/12/09 2210   LEUKOCYTESUR NEGATIVE December 09, 2015 2210    Sepsis Labs: Lactic Acid, Venous    Component Value Date/Time   LATICACIDVEN 2.01 (HH) Dec 09, 2015 1111    MICROBIOLOGY: No results found for this or any previous visit (from the past 240 hour(s)).  RADIOLOGY STUDIES/RESULTS: Ct Head Wo Contrast  Result Date: 11/28/2015 CLINICAL DATA:  Syncope with  questionable seizure. EXAM: CT HEAD WITHOUT CONTRAST TECHNIQUE: Contiguous axial images were obtained from the base of the skull through the vertex without intravenous contrast. COMPARISON:  Head CT June 23, 2011 and brain MRI June 24, 2011 FINDINGS: Brain: Mild diffuse atrophy is stable. There is no intracranial mass, hemorrhage, extra-axial fluid collection, midline shift. There is a prior left frontal lobe infarct with encephalomalacia in this area, stable. There is extensive small vessel disease throughout the centra semiovale bilaterally. Small vessel disease is noted throughout much of the left external capsule. There is small vessel disease in each thalamus. No acute appearing infarct is evident. Vascular: No hypervascular lesions are evident. There is extensive carotid siphon calcification bilaterally. There is a focal area of calcification in the distal left vertebral artery. Foci of calcification are also noted in the carotid artery slightly proximal to the siphon on each side. Skull: The bony calvarium appears intact. Sinuses/Orbits: Orbits appear symmetric bilaterally. Visualized paranasal sinuses are clear. There is leftward deviation of the nasal septum. Other: Mastoid air cells are clear. IMPRESSION: Stable atrophy. Extensive supratentorial small vessel disease. Prior left frontal lobe infarct with encephalomalacia. Small vessel disease in each thalamus and left external capsule, stable. No acute infarct evident. No hemorrhage or mass effect. Extensive arterial vascular calcification noted. Deviated nasal septum noted. Electronically Signed   By: Bretta Bang III M.D.   On: 11/28/2015 16:03   Mr Brain Wo Contrast  Result Date: 11/28/2015 CLINICAL DATA:  Seizures today, RIGHT extremity twitching. History of hypertension, hyperlipidemia, diabetes, stroke. EXAM: MRI HEAD WITHOUT CONTRAST TECHNIQUE: Multiplanar, multiecho pulse sequences of the brain and surrounding structures were obtained  without intravenous contrast. COMPARISON:  CT HEAD November 28, 2015 at 1552 hours and MRI of the brain June 24, 2011 FINDINGS: Most sequences are moderately motion degraded. BRAIN: Tubular reduced diffusion RIGHT frontal lobe pre central gyrus measuring less than 1 cm in transaxial dimension with corresponding low ADC values. No susceptibility artifact to suggest hemorrhage. Faint susceptibility artifact in LEFT frontal lobe encephalomalacia with mild ex vacuo dilatation LEFT lateral ventricle. Moderate ventriculomegaly on the basis of global parenchymal brain volume loss. Old small RIGHT cerebellar infarcts. Confluent supratentorial and infratentorial FLAIR T2 hyperintensities. Superimposed patchy abnormal FLAIR T2 hyperintense signal extending to the parietal occipital cortices and cerebellar hemispheres. Symmetric patchy bright T2 signal in the thalamus. No midline shift, mass effect or masses. No abnormal extra-axial fluid collections. Motion degraded thin slice coronal T2 prohibits evaluation of the hippocampi. VASCULAR: Normal major intracranial vascular flow voids present at skull base. SKULL AND UPPER CERVICAL SPINE: No abnormal sellar expansion. No suspicious calvarial bone marrow signal. Craniocervical junction maintained. SINUSES/ORBITS: The mastoid air-cells and included paranasal sinuses are well-aerated. The included ocular globes and orbital contents are non-suspicious. OTHER: None. IMPRESSION:  Acute small RIGHT frontal lobe infarct. 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 These results will be called to the ordering clinician or representative by the Radiologist Assistant, and communication documented in the PACS or zVision Dashboard. Electronically Signed   By: Awilda Metro  M.D.   On: 11/28/2015 23:00   US Renal  Result Date: Dec 14, 2015 CLINICAL DATA:  Renal failure, acute on chronic, hypertension, diabetes mellitus, hyperlipidemia, coronary artery disease post MI, CHF, COPD, paroxysmal atrial fibrillation EXAM: RENAL / URINARY TRACT ULTRASOUND COMPLETE COMPARISON:  Abdomen ultrasound 03/28/2013 FINDINGS: Right Kidney: Length: 11.6 cm. Normal cortical thickness. Increased cortical echogenicity. Several tiny RIGHT renal cysts. No solid mass, hydronephrosis or shadowing calcification. Left Kidney: Length: 10.3 cm. Suboptimal visualization due to body habitus and limited positioning (patient on ventilator) poorly visualized. Normal cortical thickness. No gross evidence of mass or hydronephrosis. Bladder: Poorly distended, with suboptimal assessment of wall thickness. Prostate enlargement with gland measuring 4.8 x 3.9 x 3.1 cm. IMPRESSION: Prostatic enlargement. Tiny RIGHT renal cyst. Question medical renal disease changes. Suboptimal visualization of LEFT kidney as above. Electronically Signed   By: Ulyses Southward M.D.   On: 12/14/2015 16:26   Dg Chest Port 1 View  Result Date: 11/28/2015 CLINICAL DATA:  Hypoxia EXAM: PORTABLE CHEST 1 VIEW COMPARISON:  Chest radiograph from one day prior. FINDINGS: Sternotomy wires appear aligned and intact. Stable cardiomediastinal silhouette with normal heart size and aortic atherosclerosis. No pneumothorax. Stable small bilateral pleural effusions. No overt pulmonary edema. Stable mild bibasilar lung opacities, favor atelectasis. IMPRESSION: 1. Stable small bilateral pleural effusions . 2. Stable mild bibasilar lung opacities, favor atelectasis. 3. Aortic atherosclerosis. Electronically Signed   By: Delbert Phenix M.D.   On: 11/28/2015 16:41   Dg Chest Port 1 View  Result Date: 11/27/2015 CLINICAL DATA:  Cough and congestion with shortness of Breath EXAM: PORTABLE CHEST 1 VIEW COMPARISON:  11/24/2019 FINDINGS: Cardiac shadow is stable.  Postoperative changes are again seen. Left pleural effusion is again identified and stable. Underlying basilar atelectasis is noted. Smaller right pleural effusion is seen. Changes of prior left rib fractures are noted. IMPRESSION: Bilateral pleural effusions with atelectasis left greater than right. The overall appearance is stable from the prior exam. Electronically Signed   By: Alcide Clever M.D.   On: 11/27/2015 10:28   Dg Chest Port 1 View  Result Date: 11/24/2015 CLINICAL DATA:  Acute respiratory failure.  Shortness of breath EXAM: PORTABLE CHEST 1 VIEW COMPARISON:  11/23/2015 FINDINGS: Postoperative changes in the mediastinum. Interval removal of endotracheal tube, enteric tube, and central venous catheters. Normal heart size and pulmonary vascularity. Bilateral pleural effusions with basilar infiltration or atelectasis. Old left rib fractures. Calcified and tortuous aorta. No pneumothorax. Degenerative changes in the shoulders. IMPRESSION: Bilateral pleural effusions with basilar atelectasis or infiltration. Electronically Signed   By: Burman Nieves M.D.   On: 11/24/2015 02:24   Dg Chest Port 1 View  Result Date: 11/23/2015 CLINICAL DATA:  Status post intubation EXAM: PORTABLE CHEST 1 VIEW COMPARISON:  11/22/2015 FINDINGS: Cardiac shadow is stable. Endotracheal tube, nasogastric catheter and right jugular line are again seen and stable. Lungs are well aerated bilaterally. Increasing right basilar atelectasis is noted. Persistent left basilar infiltrate is noted. Old rib fractures are seen on the left. IMPRESSION: Stable left basilar infiltrate. Increasing right basilar atelectasis. Tubes and lines as  described. Electronically Signed   By: Alcide Clever M.D.   On: 11/23/2015 07:55   Dg Chest Port 1 View  Result Date: 11/22/2015 CLINICAL DATA:  75 year old male with respiratory failure. EXAM: PORTABLE CHEST 1 VIEW COMPARISON:  11/27/2015 and prior radiograph FINDINGS: An endotracheal tube with tip  4.8 cm above the carina, NG tube entering the stomach with tip off the field of view and right IJ central venous catheter with tip overlying the mid -lower SVC again noted. Cardiomediastinal silhouette is unchanged. CABG changes again noted. Left lower lung airspace disease and small left pleural effusion again noted. There has been little interval change since the prior study. . IMPRESSION: No significant change. Continued left lower lung airspace disease and small left pleural effusion. Electronically Signed   By: Harmon Pier M.D.   On: 11/22/2015 09:02   Dg Chest Port 1 View  Result Date: 11/24/2015 CLINICAL DATA:  Intubated EXAM: PORTABLE CHEST 1 VIEW COMPARISON:  Chest radiograph from earlier today. FINDINGS: Endotracheal tube tip is 4.1 cm above the carina. Right internal jugular central venous catheter terminates in the lower third of the superior vena cava. Sternotomy wires appear aligned and intact. Stable cardiomediastinal silhouette with normal heart size and atherosclerotic thoracic aorta. No pneumothorax. Stable small left pleural effusion and patchy left lung base opacity. No pulmonary edema. IMPRESSION: 1. Well-positioned support structures as described. No pneumothorax. 2. Stable patchy left lung base opacity, which could represent aspiration, pneumonia or atelectasis. 3. Probable stable small left pleural effusion. Electronically Signed   By: Delbert Phenix M.D.   On: 11/23/2015 18:02   Dg Chest Portable 1 View  Result Date: 11/27/2015 CLINICAL DATA:  Productive cough x 2-3 days, worsening of SOB last night; hx CHF, COPD, HTN, dementia, smoker; pt was very unstable, confused, tech was not able to position pt for better AP view. EXAM: PORTABLE CHEST 1 VIEW COMPARISON:  06/23/2011 FINDINGS: Cardiac silhouette is normal in size. There stable changes from previous CABG surgery. No mediastinal or hilar masses. There is hazy opacity in the left lung base. This a reflect atelectasis or pneumonia.  Lungs otherwise clear. No convincing pleural effusion. No gross pneumothorax. The bony thorax is demineralized. There are old healed rib fractures on the left. IMPRESSION: 1. Hazy left lung base opacity which may reflect pneumonia or atelectasis. No other evidence of acute cardiopulmonary disease. Electronically Signed   By: Amie Portland M.D.   On: 11/17/2015 10:46   Dg Swallowing Func-speech Pathology  Result Date: 11/25/2015 Objective Swallowing Evaluation: Type of Study: MBS-Modified Barium Swallow Study Patient Details Name: NIKOLOZ HUY MRN: [id number] Date of Birth: Nov 21, 1940 Today's Date: 11/25/2015 Time: SLP Start Time (ACUTE ONLY): 1200-SLP Stop Time (ACUTE ONLY): 1218 SLP Time Calculation (min) (ACUTE ONLY): 18 min Past Medical History: Past Medical History: Diagnosis Date . CHF (congestive heart failure) (HCC)   S/p CABG  . Chronic kidney disease   Baseline Cr appears to be 1.2-1.4  . COPD (chronic obstructive pulmonary disease) (HCC)  . Coronary artery disease  . CVA (cerebral infarction)   CT head 06/2011 with remote left frontal infarct, remote basal ganglia  lacunar infarcts bilaterally . Dementia  . Diabetes mellitus  . Hyperlipidemia  . Hypertension  . Myocardial infarction (HCC)  . Occlusion and stenosis of carotid artery without mention of cerebral infarction  . Paroxysmal a-fib (HCC)  . PVD (peripheral vascular disease) (HCC)   S/p bilateral BKA's  . Tobacco abuse   States 2 PPD currently  Past Surgical History: Past Surgical History: Procedure Laterality Date . Coronary artery bypass x3  01/04/2002 . Lt below-the-knee amputation  12/07/2009 . RT below-the-knee amputation  07/31/2008 HPI: 75 year old smoker ETOH abuse, COPD, CVA, dementia, bilateral amputee, CABG, HTN nursing home resident admitted for respiratory distress.Intubated 9/16-9/18. CXR Bilateral pleural effusions with basilar atelectasis or infiltration. No prior ST notes. No Data Recorded Assessment / Plan / Recommendation  CHL IP CLINICAL IMPRESSIONS 11/25/2015 Therapy Diagnosis Mild pharyngeal phase dysphagia;Mild oral phase dysphagia;Moderate oral phase dysphagia Clinical Impression Mild-mod oral and mild pharyngeal dysphagia due to lingual weakness/discoordination resulting in decreased manipulation and delayed propulsion due to holding boluses. Pharyngeal deficits due to sensory impairment leading to reduced tongue base retraction and decreased laryngeal elevation leaving mild vallecular and pyriform sinus residue. Verbal cues for second swallow reduced amount. Pt coughed prior to barium consumption and frequently throughout study without encroachment of po's into laryngeal vestibule. Increasing aspiration risk is pt's current confused state. Recommend Dys 1 texture and thin liquids, no straws, small sips, swallow twice, FULL supervision. ST will follow.   Impact on safety and function Moderate aspiration risk   [facility] IP TREATMENT RECOMMENDATION 11/25/2015 Treatment Recommendations Therapy as outlined in treatment plan below   Prognosis 11/25/2015 Prognosis for Safe Diet Advancement (No Data) Barriers to Reach Goals Cognitive deficits Barriers/Prognosis Comment -- [facility] IP DIET RECOMMENDATION 11/25/2015 SLP Diet Recommendations Dysphagia 1 (Puree) solids;Thin liquid Liquid Administration via Cup;No straw Medication Administration Crushed with puree Compensations Minimize environmental distractions;Slow rate;Small sips/bites;Multiple dry swallows after each bite/sip Postural Changes Seated upright at 90 degrees   [facility] IP OTHER RECOMMENDATIONS 11/25/2015 Recommended Consults -- Oral Care Recommendations Oral care BID Other Recommendations --   [facility] IP FOLLOW UP RECOMMENDATIONS 11/25/2015 Follow up Recommendations (No Data)   [facility] IP FREQUENCY AND DURATION 11/25/2015 Speech Therapy Frequency (ACUTE ONLY) min 2x/week Treatment Duration 2 weeks      [facility] IP ORAL PHASE 11/25/2015 Oral Phase Impaired Oral - Pudding Teaspoon -- Oral - Pudding Cup -- Oral  - Honey Teaspoon -- Oral - Honey Cup -- Oral - Nectar Teaspoon Delayed oral transit Oral - Nectar Cup Delayed oral transit Oral - Nectar Straw -- Oral - Thin Teaspoon -- Oral - Thin Cup Delayed oral transit Oral - Thin Straw WFL Oral - Puree -- Oral - Mech Soft -- Oral - Regular Delayed oral transit;Weak lingual manipulation Oral - Multi-Consistency -- Oral - Pill -- Oral Phase - Comment --  [facility] IP PHARYNGEAL PHASE 11/25/2015 Pharyngeal Phase Impaired Pharyngeal- Pudding Teaspoon -- Pharyngeal -- Pharyngeal- Pudding Cup -- Pharyngeal -- Pharyngeal- Honey Teaspoon -- Pharyngeal -- Pharyngeal- Honey Cup -- Pharyngeal -- Pharyngeal- Nectar Teaspoon WFL Pharyngeal -- Pharyngeal- Nectar Cup Pharyngeal residue - valleculae;Reduced tongue base retraction Pharyngeal -- Pharyngeal- Nectar Straw -- Pharyngeal -- Pharyngeal- Thin Teaspoon -- Pharyngeal -- Pharyngeal- Thin Cup Pharyngeal residue - valleculae;Pharyngeal residue - pyriform;Reduced laryngeal elevation;Reduced tongue base retraction Pharyngeal -- Pharyngeal- Thin Straw Pharyngeal residue - valleculae;Pharyngeal residue - pyriform;Reduced tongue base retraction;Reduced laryngeal elevation Pharyngeal -- Pharyngeal- Puree NT Pharyngeal -- Pharyngeal- Mechanical Soft -- Pharyngeal -- Pharyngeal- Regular Pharyngeal residue - valleculae;Reduced tongue base retraction Pharyngeal -- Pharyngeal- Multi-consistency -- Pharyngeal -- Pharyngeal- Pill -- Pharyngeal -- Pharyngeal Comment --  [facility] IP CERVICAL ESOPHAGEAL PHASE 11/25/2015 Cervical Esophageal Phase WFL Pudding Teaspoon -- Pudding Cup -- Honey Teaspoon -- Honey Cup -- Nectar Teaspoon -- Nectar Cup -- Nectar Straw -- Thin Teaspoon -- Thin Cup -- Thin Straw -- Puree -- Mechanical Soft -- Regular --  Multi-consistency -- Pill -- Cervical Esophageal Comment -- No flowsheet data found. Royce Macadamia 11/25/2015, 2:23 PM Breck Coons Lonell Face.Ed CCC-SLP Pager [phone]                LOS: 11 days   Jeoffrey Massed,  MD  [facility] Pager:[phone]  If 7PM-7AM, please contact night-coverage [email or web address] Password [email or web address] 12/02/2015, 2:42 PM

## 2015-12-02 NOTE — Progress Notes (Signed)
Daily Progress Note   Patient Name: [name]       Date: 12/02/2015 DOB: 1940-04-04  Age: 75 y.o. MRN#: [id number] Attending Physician: Maretta Bees, MD Primary Care Physician: Londell Moh, MD Admit Date: 11/23/2015  Reason for Consultation/Follow-up: Establishing goals of care  Subjective: [name] "[name]" has much declined today. Breathing is agonal, fingers cool to touch and he appears to be actively dying. I spoke separately with his daughter and his son and explained that he continues to decline and I fear there is nothing more that we can offer to reverse his situation. They are understandable and agree care should be refocused to keep him comfortable at end of life. All questions/concerns addressed. Emotional support provided.   Length of Stay: 11  Current Medications: Scheduled Meds:  . albuterol  5 mg Nebulization Once  . budesonide (PULMICORT) nebulizer solution  0.5 mg Nebulization BID  . ipratropium-albuterol  3 mL Nebulization TID  . levETIRAcetam  1,000 mg Intravenous Q12H  . phenytoin (DILANTIN) IV  100 mg Intravenous Q8H    Continuous Infusions: . dextrose 5 % 1,000 mL infusion 75 mL/hr at 11/30/15 1116    PRN Meds: acetaminophen, acetaminophen, fentaNYL (SUBLIMAZE) injection, glycopyrrolate, hydrALAZINE, LORazepam  Physical Exam  Constitutional: He appears well-developed.  HENT:  Head: Normocephalic and atraumatic.  Cardiovascular: Bradycardia present.   Pulmonary/Chest: No accessory muscle usage. He is in respiratory distress. He has rhonchi.  Abdominal: Soft. Normal appearance.  Neurological: He is unresponsive.            Vital Signs: BP (!) 107/53 (BP Location: Right Arm)   Pulse 66   Temp 97.7 F (36.5 C) (Oral)   Resp  15   Ht 4' (1.219 m)   Wt 63.5 kg (139 lb 15.9 oz)   SpO2 (!) 77%   BMI 42.72 kg/m  SpO2: SpO2: (!) 77 % O2 Device: O2 Device: NRB O2 Flow Rate: O2 Flow Rate (L/min): 15 L/min  Intake/output summary:  Intake/Output Summary (Last 24 hours) at 12/02/15 0926 Last data filed at 12/02/15 0800  Gross per 24 hour  Intake             3440 ml  Output              450 ml  Net  2990 ml   LBM: Last BM Date: 11/27/15 Baseline Weight: Weight: 74.7 kg (164 lb 10.9 oz) Most recent weight: Weight: 63.5 kg (139 lb 15.9 oz)       Palliative Assessment/Data: PPS: 10%     Patient Active Problem List   Diagnosis Date Noted  . Seizure (HCC)   . PAF (paroxysmal atrial fibrillation) (HCC)   . Elevated liver function tests   . Persistent atrial fibrillation (HCC)   . CAD in native artery   . Malnutrition of moderate degree 11/24/2015  . Dyspnea   . Sepsis (HCC)   . Renal failure (ARF), acute on chronic (HCC)   . Endotracheal tube present   . Acute respiratory failure (HCC) 2015/12/08  . Healthcare-associated pneumonia   . Renal failure   . Weakness 06/23/2011  . Confusion 06/23/2011  . Hypotension 06/23/2011  . Hypernatremia 06/23/2011  . Hyperchloremia 06/23/2011  . Prerenal azotemia 06/23/2011  . Macrocytosis without anemia 06/23/2011  . Tobacco abuse 06/23/2011  . 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 PVD 09/18/2007  . CELLULITIS AND ABSCESS OF UNSPECIFIED SITE 08/14/2007  . DM W/COMPLICATION NOS, TYPE II 10/04/2006  . GERD 10/04/2006  . RENAL INSUFFICIENCY, CHRONIC 10/04/2006  . INFECTION NOS, BONE, UNSPECIFIED SITE 10/04/2006  . STATUS, ARTHRODESIS 10/04/2006  . 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 male  with past medical history of COPD (smokes 3 packs/day per daughter), CAD status post CABG (2003), PAD status post bilateral BKA, dementia, paroxysmal atrial  fibrillation no longer on anticoagulation, ETOH abuse, from SNF admitted on 2015-12-08 with respiratory failure requiring intubation. Developed AF RVR and acute renal failure (resolved) as well as severe prolonged seizure activity on 9/23 and poorly responsive since.   Assessment: Lying in bed, agonal breathing, actively dying  Recommendations/Plan:  Pain/dyspnea: Morphine 1 mg/hr with bolus every 1-2 mg 15 minutes prn.   Secretions: Robinul prn.   Agitation/seizure: Ativan prn.   Goals of Care and Additional Recommendations:  Limitations on Scope of Treatment: Full Comfort Care  Code Status:    Code Status Orders        Start     Ordered   11/24/15 1050  Do not attempt resuscitation (DNR)  Continuous    Question Answer Comment  In the event of cardiac or respiratory ARREST Do not call a "code blue"   In the event of cardiac or respiratory ARREST Do not perform Intubation, CPR, defibrillation or ACLS   In the event of cardiac or respiratory ARREST Use medication by any route, position, wound care, and other measures to relive pain and suffering. May use oxygen, suction and manual treatment of airway obstruction as needed for comfort.   Comments NO pressors, NO HD, NO HEROICS      11/24/15 1049    Code Status History    Date Active Date Inactive Code Status Order ID Comments User Context   2015-12-08 12:34 PM 11/24/2015 10:49 AM Full Code [id number]  Oretha Milch, MD ED   06/23/2011  9:09 PM 06/26/2011  5:11 PM Full Code [id number]  Leta Baptist, RN ED       Prognosis:   Hours - Days  Discharge Planning:  Anticipated Hospital Death  Care plan was discussed with Dr. Jerral Ralph, family, RN at bedside.   Thank you for allowing the Palliative Medicine Team to assist in the care of this patient.   Time In: 0900 Time  Out: 1000 Total Time 60min Prolonged Time Billed  no       Greater than 50%  of this time was spent counseling and coordinating care related to the above  assessment and plan.  Ulice BoldParker, Devaeh Amadi C, NP  Please contact Palliative Medicine Team phone at [phone] for questions and concerns.

## 2015-12-02 NOTE — Care Management Important Message (Signed)
Important Message  Patient Details  Name: Bryan BunnellDonald G Scheck MRN: [id number] Date of Birth: 09/05/1940   Medicare Important Message Given:  Yes    Dorena BodoIris Leotha Voeltz 12/02/2015, 10:23 AM

## 2015-12-02 NOTE — Progress Notes (Signed)
Neurology Progress Note  Subjective: He has remained seizure-free. However, he continues to do poorly. He is still requiring a non-rebreather and is once again unresponsive. He is unable to participate with ROS due to his mental status.    Current Meds:   Current Facility-Administered Medications:  .  acetaminophen (TYLENOL) suppository 650 mg, 650 mg, Rectal, Q6H PRN, Jose Alexis FrockAngelo A de Dios, MD, 650 mg at 11/23/15 2201 .  acetaminophen (TYLENOL) tablet 650 mg, 650 mg, Oral, Q4H PRN, Oretha Milchakesh V Alva, MD .  albuterol (PROVENTIL) (2.5 MG/3ML) 0.083% nebulizer solution 5 mg, 5 mg, Nebulization, Once, Maia PlanJoshua G Long, MD .  amLODipine (NORVASC) tablet 5 mg, 5 mg, Oral, Daily, Quintella Reichertraci R Turner, MD, Stopped at 11/30/15 1025 .  ampicillin-sulbactam (UNASYN) 1.5 g in sodium chloride 0.9 % 50 mL IVPB, 1.5 g, Intravenous, Q12H, Lauren D Bajbus, RPH, 1.5 g at 12/02/15 0400 .  aspirin EC tablet 81 mg, 81 mg, Oral, Daily, Lennette Biharihomas A Kelly, MD, Stopped at 11/30/15 1026 .  budesonide (PULMICORT) nebulizer solution 0.5 mg, 0.5 mg, Nebulization, BID, Jose Angelo A Bradentonde Dios, MD, 0.5 mg at 12/01/15 2055 .  dextrose 5 % 1,000 mL infusion, , Intravenous, Continuous, Leatha Gildingostin M Gherghe, MD, Last Rate: 75 mL/hr at 11/30/15 1116 .  donepezil (ARICEPT) tablet 10 mg, 10 mg, Oral, QHS, Leatha Gildingostin M Gherghe, MD, Stopped at 11/30/15 2331 .  famotidine (PEPCID) tablet 20 mg, 20 mg, Oral, QHS, Maretta BeesShanker M Ghimire, MD, 20 mg at 11/27/15 2122 .  feeding supplement (ENSURE ENLIVE) (ENSURE ENLIVE) liquid 237 mL, 237 mL, Oral, BID BM, Costin Otelia SergeantM Gherghe, MD .  feeding supplement (PRO-STAT SUGAR FREE 64) liquid 30 mL, 30 mL, Oral, TID, Maretta BeesShanker M Ghimire, MD, 30 mL at 11/28/15 1014 .  fentaNYL (SUBLIMAZE) injection 50 mcg, 50 mcg, Intravenous, Q2H PRN, Oretha Milchakesh V Alva, MD, 50 mcg at 11/22/15 0412 .  heparin injection 5,000 Units, 5,000 Units, Subcutaneous, Q8H, Oretha Milchakesh V Alva, MD, 5,000 Units at 12/02/15 0616 .  hydrALAZINE (APRESOLINE) injection 5 mg, 5  mg, Intravenous, Q4H PRN, Leatha Gildingostin M Gherghe, MD .  insulin aspart (novoLOG) injection 0-9 Units, 0-9 Units, Subcutaneous, Q4H, Roslynn AmbleJennings E Nestor, MD, 1 Units at 12/01/15 1720 .  insulin glargine (LANTUS) injection 8 Units, 8 Units, Subcutaneous, Daily, Tasrif Ahmed, MD, 8 Units at 12/01/15 1100 .  ipratropium-albuterol (DUONEB) 0.5-2.5 (3) MG/3ML nebulizer solution 3 mL, 3 mL, Nebulization, TID, Nelda Bucksaniel J Feinstein, MD, 3 mL at 12/01/15 2056 .  levETIRAcetam (KEPPRA) 1,000 mg in sodium chloride 0.9 % 100 mL IVPB, 1,000 mg, Intravenous, Q12H, Rejeana BrockMcNeill P Kirkpatrick, MD, 1,000 mg at 12/02/15 0200 .  methylPREDNISolone sodium succinate (SOLU-MEDROL) 40 mg/mL injection 40 mg, 40 mg, Intravenous, Q12H, Maretta BeesShanker M Ghimire, MD, 40 mg at 12/02/15 [id number] .  phenytoin (DILANTIN) injection 100 mg, 100 mg, Intravenous, Q8H, Leatha Gildingostin M Gherghe, MD, 100 mg at 12/02/15 [id number]  Objective:  Temp:  [97.7 F (36.5 C)-98.4 F (36.9 C)] 97.7 F (36.5 C) (09/27 0453) Pulse Rate:  [43-66] 66 (09/27 0453) Resp:  [11-16] 15 (09/27 0453) BP: (107-142)/(33-53) 107/53 (09/27 0453) SpO2:  [93 %-98 %] 93 % (09/27 0453) FiO2 (%):  [28 %] 28 % (09/26 1513) Weight:  [63.5 kg (139 lb 15.9 oz)] 63.5 kg (139 lb 15.9 oz) (09/26 2124)  General: Thin chronically-ill-appearing male lying in bed with non-rebreather in place. He is unresponsive to verbal stimuli. He will move his arms with stimulation but this is non-purposeful. He does not open his eyes, either spontaneously  or with stimulation. He is non-verbal. He does not follow any commands.  HEENT: Neck is supple without lymphadenopathy. Non-rebreather in place. Sclerae are anicteric. There is mild conjunctival injection.  CV: Regular. Carotid pulses are 2+ and symmetric with no bruits.  Lungs: Coarse breath sounds on anterior auscultation.  Extremities: He is s/p BLE amputations. No C/C/E. Neuro: MS: As noted above. CN: Pupils are equal and reactive from 2-->1 mm bilaterally. He  blinks weakly to visual threat. Eyes are conjugate. Corneals are intact bilaterally. His face appears symmetric at rest with mildly asymmetric grimace. The remainder of his cranial nerves cannot be accurately assessed as he does not participate with the exam.  Motor: Bulk is reduced throughout. No spontaneous movement is noted. No tremor or other abnormal movements are observed.  Sensation: He grimaces and withdraws from noxious stimulation x4.  DTRs: 2+, symmetric in the UEs.   Coordination and gait: These cannot be assessed as the patient is unable to participate with the exam. Also, he is s/p BLE amputations.   Labs: Lab Results  Component Value Date   WBC 12.9 (H) 12/01/2015   HGB 12.5 (L) 12/01/2015   HCT 41.4 12/01/2015   PLT 131 (L) 12/01/2015   GLUCOSE 244 (H) 12/01/2015   CHOL  12/03/2009    83        ATP III CLASSIFICATION:  <200     mg/dL   Desirable  409-811  mg/dL   Borderline High  >=914    mg/dL   High          TRIG 50 12/03/2009   HDL 34 (L) 12/03/2009   LDLCALC  12/03/2009    39        Total Cholesterol/HDL:CHD Risk Coronary Heart Disease Risk Table                     Men   Women  1/2 Average Risk   3.4   3.3  Average Risk       5.0   4.4  2 X Average Risk   9.6   7.1  3 X Average Risk  23.4   11.0        Use the calculated Patient Ratio above and the CHD Risk Table to determine the patient's CHD Risk.        ATP III CLASSIFICATION (LDL):  <100     mg/dL   Optimal  782-956  mg/dL   Near or Above                    Optimal  130-159  mg/dL   Borderline  213-086  mg/dL   High  >578     mg/dL   Very High   ALT 469 (H) 11/28/2015   AST 21 11/28/2015   NA 145 12/01/2015   K 3.5 12/01/2015   CL 104 12/01/2015   CREATININE 1.04 12/01/2015   BUN 36 (H) 12/01/2015   CO2 35 (H) 12/01/2015   TSH 0.457 11/24/2015   INR 1.10 09/14/2010   HGBA1C 6.3 (H) 11/22/2015   CBC Latest Ref Rng & Units 12/01/2015 11/30/2015 11/28/2015  WBC 4.0 - 10.5 K/uL 12.9(H)  12.2(H) 6.9  Hemoglobin 13.0 - 17.0 g/dL 12.5(L) 12.6(L) 13.9  Hematocrit 39.0 - 52.0 % 41.4 40.8 43.5  Platelets 150 - 400 K/uL 131(L) 68(L) 142(L)    Lab Results  Component Value Date   HGBA1C 6.3 (H) 11/22/2015   Lab Results  Component Value Date  ALT 199 (H) 11/28/2015   AST 21 11/28/2015   ALKPHOS 67 11/28/2015   BILITOT 1.6 (H) 11/28/2015    Radiology:   There is no new neuroimaging for review.    A/P:   1. 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 Last seizure was on 11/28/15. Continue phenytoin and Keppra for now, watching for behavioral issues on Keppra (this can cause agitation and sometimes aggression in demented patients). Continue seizure precautions.   2. Ischemic stroke: MRI demonstrated an apparent acute ischemic infarct in the right precentral gyrus. Known risk factors for cerebrovascular disease in this patient include afib (not on anticoagulation), CAD, DM, dyslipidemia, HTN, ongoing tobacco abuse, age, and prior stroke. Continue aspirin. Avoid hypoxia. He was not on a statin but would consider adding one given his vascular history and the extent of small vessel disease on his MRI, depending upon goals of care. TTE did not reveal an embolic source. Could consider carotid Dopplers to complete workup but he is not a good candidate for intervention and would favor medical management regardless of results.   3. Acute encephalopathy: This is likely multifactorial in nature with contributions from acute hypoxic respiratory failure, acute-on-chronic renal failure, acute hepatic dysfunction, and persistent seizure. All of these are superimposed upon poor cerebral reserve and what sounds like a significant background dementia which predisposes him to encephalopathy and delirium from all causes and predicts delayed and perhaps  incomplete recovery from same. Treatment is supportive. Continue to optimize metabolic status as you are. Limit sedating medications, particularly opiates, benzos, and anything with strong anticholinergic properties.   4. Dementia: This is severe, probably vascular vs mixed (AD + vascular). This predisposes him to delirium and encephalopathy from all causes. He may take awhile to clear and his encephalopathy could persist beyond correction of all metabolic and other acute issues. Continue donepezil.   No family present at the time of the visit. I have reviewed Dr. Charlean Sanfilippo note from yesterday. He spoke with the patient's daughter about goals of care given his further decline and likelihood that he will have a poor recovery with underlying dementia and prolonged delirium. Palliative care has been consulted and this is pending at this time. I would support palliative involvement and possible hospice/comfort measures given severity of baseline dementia and high likelihood that he has a high probability of having a worse functional baseline should he survive this admission.   Rhona Leavens, MD [facility]

## 2015-12-06 NOTE — Progress Notes (Signed)
Wasted 240 cc of the Morphine drip in sink [place] Hotelsebecca Greenawald witnessed [address] Yvonnia Tango RN

## 2015-12-06 NOTE — Discharge Summary (Signed)
Death Summary  [name] WUJ:[id number] DOB: 08/30/1940 DOA: 007/20/17  PCP: Londell MohPHARR,WALTER DAVIDSON, MD Admit date: 007/20/17 Date of Death: 11/12/2015  Final Diagnoses:  Active Problems:   Acute respiratory failure (HCC)   Healthcare-associated pneumonia   Renal failure   Sepsis (HCC)   Renal failure (ARF), acute on chronic (HCC)   Endotracheal tube present   Dyspnea   Malnutrition of moderate degree   Elevated liver function tests   Persistent atrial fibrillation (HCC)   CAD in native artery   PAF (paroxysmal atrial fibrillation) (HCC)   Seizure (HCC)   Palliative care encounter   Terminal care   Brief Summary:  Patient is a 75 y.o. male resident of a skilled nursing facility, history of COPD, CAD status post CABG (2003), PAD status post bilateral BKA, dementia, paroxysmal atrial fibrillation no longer on anticoagulation admitted to the intensive care unit on 9/16 with acute hypercarbic respiratory failure likely from acute exacerbation of COPD. Initially started on BiPAP, however required intubation post admission. He was managed with empiric antibiotics and other supportive measures, and subsequently extubated on 9/18. Hospital course has also been complicated by development of atrial fibrillation with RVR requiring IV digoxin and IV amiodarone infusion, transaminitis,acute renal failure and delirium. [facility] on [facility]. Unforrtunately patient on 9/23, patient developed status epilepticus, neurology was consulted-he was started on antiepileptics. He continued to deteriorate, becoming more encephalopathic-he was subsequently seen by palliative care services, and was transitioned to full comfort measures.   Hospital Course:  Acute hypercarbic respiratory failure secondary to COPD exacerbation:Intubated on admission, subsequently extubated on 9/18. He was treated with  bronchodilators, steroids and empiric antibiotics. Was getting  better, however he had an episode of seizure with status epilepticus-subsequently became very encephalopathic. He was then transitioned to full comfort measures..   Severe sepsis secondary to Healthcare associated pneumonia: Initially improved with resolution of sepsis pathophysiology. Was treated with broad-spectrum antibiotics she completed on 9/24. Culture data negative so far. As noted above,   he had an episode of seizure with status epilepticus-subsequently became very encephalopathic. He was then transitioned to full comfort measures..    Status epilepticus: Occurred on 9/23-seen by neurology and started on anti-epileptics-he continued to be encephalopathic after having seizures, and continued to deteriorate to the extent he became unresponsive. Family discussed with palliative care team, and subsequently was transitioned to full comfort measures.   Acute metabolic encephalopathy: Initially felt secondary to sepsis and hypercarbia, somewhat improved-hospital course was complicated by delirium probably related to dementia, during the latter half of his hospital stay, encephalopathy was likely related to status epilepticus/seizures. He was started on antiepileptics, continued to become very lethargic/and subsequently became unresponsive. As noted above he was transitioned to full comfort measures.  Atrial fibrillation with RVR: Occurred while the patient was in the intensive care unit-and required rate control with IV amiodarone, IV digoxin and IV Lopressor. Patient back in sinus rhythm. Cardiology was consulted during this hospital stay, felt to be a poor long-term candidate for anticoagulation even though he had a chads2vasc score of 6. Unfortunately, with continued deterioration having seizures-he was transitioned to full comfort measures.   Mildly elevated troponin: Trend was flat and not consistent with ACS. Suspect more secondary to demand ischemia.   Acute on chronic renal disease stage  III: Acute renal failure secondary secondary to ATN. Creatinine improved with supportive care. Nephrology consulted during this hospital stay, recommendations were not to restart ACEs/ARB on discharge-given continued deterioration  he was discontinued off all antihypertensive medications.  Dysphagia: Initially kept Nothing by mouth-seen by speech therapy follow-and underwent modified barium swallow on 9/20-started on a puree diet-family was agreeable with starting diet with known aspiration risk. Given continued clinical decline-and worsening mental status-he was kept nothing by mouth.  Transaminitis:? Etiology-likely secondary to sepsis or from EtOH/statin use. Acute hepatitis serology negative. LFT's downtrended  Thrombocytopenia: Mild, suspect secondary to sepsis  Type 2 diabetes: CBGs were controlled with Lantus and SSI-since he wastransitioned to full comfort measures, insulin/CBG monitoring was discontinued.  History of CAD status post CABG  History of PAD status post bilateral BKA  AAA: distal, measured 3 cm x 3 xcm in 2014  Hx Dementia with delirium:  GERD  Malnutrition of moderate degree  Signed:  GHIMIRE,SHANKER  [facility] December 04, 2015, 2:13 PM

## 2015-12-06 NOTE — Progress Notes (Signed)
[name] also called for this change in patient

## 2015-12-06 NOTE — Progress Notes (Signed)
Neurology Progress Note  Subjective: He has remained seizure-free. Due to clinical deterioration and poor prognosis, he is now on comfort measures.   Current Meds:   Current Facility-Administered Medications:  .  acetaminophen (TYLENOL) suppository 650 mg, 650 mg, Rectal, Q6H PRN, Jose Alexis FrockAngelo A de Dios, MD, 650 mg at 11/23/15 2201 .  acetaminophen (TYLENOL) tablet 650 mg, 650 mg, Oral, Q4H PRN, Oretha Milchakesh V Alva, MD .  antiseptic oral rinse (BIOTENE) solution 15 mL, 15 mL, Topical, PRN, Maretta BeesShanker M Ghimire, MD .  atropine 1 % ophthalmic solution 4 drop, 4 drop, Sublingual, Q4H PRN, Maretta BeesShanker M Ghimire, MD .  dextrose 5 % 1,000 mL infusion, , Intravenous, Continuous, Maretta BeesShanker M Ghimire, MD, Last Rate: 10 mL/hr at 12/02/15 0942 .  diphenhydrAMINE (BENADRYL) injection 12.5 mg, 12.5 mg, Intravenous, Q4H PRN, Maretta BeesShanker M Ghimire, MD .  glycopyrrolate (ROBINUL) injection 0.2 mg, 0.2 mg, Intravenous, Q4H PRN, Ulice BoldAlicia C Parker, NP, 0.2 mg at 12/02/15 1749 .  haloperidol (HALDOL) tablet 0.5 mg, 0.5 mg, Oral, Q4H PRN **OR** haloperidol (HALDOL) 2 MG/ML solution 0.5 mg, 0.5 mg, Sublingual, Q4H PRN **OR** haloperidol lactate (HALDOL) injection 0.5 mg, 0.5 mg, Intravenous, Q4H PRN, Maretta BeesShanker M Ghimire, MD .  ipratropium-albuterol (DUONEB) 0.5-2.5 (3) MG/3ML nebulizer solution 3 mL, 3 mL, Nebulization, Q2H PRN, Maretta BeesShanker M Ghimire, MD .  levETIRAcetam (KEPPRA) 1,000 mg in sodium chloride 0.9 % 100 mL IVPB, 1,000 mg, Intravenous, Q12H, Rejeana BrockMcNeill P Kirkpatrick, MD, 1,000 mg at 12/02/15 1930 .  LORazepam (ATIVAN) injection 1 mg, 1 mg, Intravenous, Q4H PRN, Ulice BoldAlicia C Parker, NP .  morphine 250 mg in dextrose 5 % 250 mL (1 mg/mL) infusion, 1 mg/hr, Intravenous, Continuous, Ulice BoldAlicia C Parker, NP, Last Rate: 1 mL/hr at 12/02/15 1611, 1 mg/hr at 12/02/15 1611 .  morphine bolus via infusion 1-2 mg, 1-2 mg, Intravenous, Q15 min PRN, Ulice BoldAlicia C Parker, NP .  phenytoin (DILANTIN) injection 100 mg, 100 mg, Intravenous, Q8H, Leatha Gildingostin M Gherghe, MD,  100 mg at 11/09/2015 0038 .  polyvinyl alcohol (LIQUIFILM TEARS) 1.4 % ophthalmic solution 1 drop, 1 drop, Both Eyes, QID PRN, Maretta BeesShanker M Ghimire, MD  Objective:  Temp:  [98 F (36.7 C)] 98 F (36.7 C) (09/27 0959) Pulse Rate:  [58] 58 (09/27 0959) Resp:  [12] 12 (09/27 0959) BP: (124)/(31) 124/31 (09/27 0959) SpO2:  [77 %-91 %] 91 % (09/27 0959) FiO2 (%):  [100 %] 100 % (09/27 0719)  General: Thin chronically-ill-appearing male lying in bed with non-rebreather in place. He is unresponsive to verbal, tactile, and noxious stimuli. He does not open his eyes, either spontaneously or with stimulation. He is non-verbal. He does not follow any commands.  HEENT: Neck is supple without lymphadenopathy. Non-rebreather in place. Sclerae are anicteric. There is mild conjunctival injection.  CV: Regular. Carotid pulses are 2+ and symmetric with no bruits.  Lungs: Coarse breath sounds on anterior auscultation. Breath sounds relatively diminished in left base.  Extremities: He is s/p BLE amputations. No C/C/E. Neuro: MS: As noted above. CN: Pupils are equal and reactive from 2-->1 mm bilaterally. No blink to visual threat. Eyes are dysconjugate. Corneals are intact bilaterally, better on the left. He appears to have some flattening of the right nasolabial fold. The remainder of his cranial nerves cannot be accurately assessed as he does not participate with the exam.  Motor: Bulk is reduced throughout. No spontaneous movement is noted. No tremor or other abnormal movements are observed.  Sensation: He has no response to noxious stimulation x4.  DTRs: 2+, symmetric in the UEs.   Coordination and gait: These cannot be assessed as the patient is unable to participate with the exam. Also, he is s/p BLE amputations.   Labs: Lab Results  Component Value Date   WBC 12.9 (H) 12/01/2015   HGB 12.5 (L) 12/01/2015   HCT 41.4 12/01/2015   PLT 131 (L) 12/01/2015   GLUCOSE 244 (H) 12/01/2015   CHOL  12/03/2009     83        ATP III CLASSIFICATION:  <200     mg/dL   Desirable  161-096  mg/dL   Borderline High  >=045    mg/dL   High          TRIG 50 12/03/2009   HDL 34 (L) 12/03/2009   LDLCALC  12/03/2009    39        Total Cholesterol/HDL:CHD Risk Coronary Heart Disease Risk Table                     Men   Women  1/2 Average Risk   3.4   3.3  Average Risk       5.0   4.4  2 X Average Risk   9.6   7.1  3 X Average Risk  23.4   11.0        Use the calculated Patient Ratio above and the CHD Risk Table to determine the patient's CHD Risk.        ATP III CLASSIFICATION (LDL):  <100     mg/dL   Optimal  409-811  mg/dL   Near or Above                    Optimal  130-159  mg/dL   Borderline  914-782  mg/dL   High  >956     mg/dL   Very High   ALT 213 (H) 11/28/2015   AST 21 11/28/2015   NA 145 12/01/2015   K 3.5 12/01/2015   CL 104 12/01/2015   CREATININE 1.04 12/01/2015   BUN 36 (H) 12/01/2015   CO2 35 (H) 12/01/2015   TSH 0.457 11/24/2015   INR 1.10 09/14/2010   HGBA1C 6.3 (H) 11/22/2015   CBC Latest Ref Rng & Units 12/01/2015 11/30/2015 11/28/2015  WBC 4.0 - 10.5 K/uL 12.9(H) 12.2(H) 6.9  Hemoglobin 13.0 - 17.0 g/dL 12.5(L) 12.6(L) 13.9  Hematocrit 39.0 - 52.0 % 41.4 40.8 43.5  Platelets 150 - 400 K/uL 131(L) 68(L) 142(L)    Lab Results  Component Value Date   HGBA1C 6.3 (H) 11/22/2015   Lab Results  Component Value Date   ALT 199 (H) 11/28/2015   AST 21 11/28/2015   ALKPHOS 67 11/28/2015   BILITOT 1.6 (H) 11/28/2015    Radiology:   There is no new neuroimaging for review.    A/P:   1. 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 Last seizure was on 11/28/15. Continue phenytoin and Keppra, now comfort care.   2. Ischemic stroke: MRI demonstrated an apparent acute ischemic infarct in the right precentral gyrus. Known risk  factors for cerebrovascular disease in this patient include afib (not on anticoagulation), CAD, DM, dyslipidemia, HTN, ongoing tobacco abuse, age, and prior stroke. Now on full comfort measures.  3. Acute encephalopathy: This is likely multifactorial in nature with contributions from acute hypoxic respiratory failure, acute-on-chronic  renal failure, acute hepatic dysfunction, and persistent seizure. All of these are superimposed upon poor cerebral reserve and significant background dementia which predisposes him to encephalopathy and delirium from all causes and predicts delayed and perhaps incomplete recovery from same. Comfort care.   4. Dementia: This is severe, probably vascular vs mixed (AD + vascular). This predisposes him to delirium and encephalopathy from all causes. Comfort care.   No family present at the time of the visit. I have reviewed Dr. Windell Norfolk note from yesterday. The patient is now comfort care, which is appropriate given clinical status and poor prognosis for meaningful neurologic recovery. I have no additional recs at this time and will sign off. Please call if any acute issues arise.   Rhona Leavens, MD [facility]

## 2015-12-06 NOTE — Progress Notes (Signed)
Responded to page to support daughter at bedside. Patient passed.  Prayed with daughter and provided guidance, emotional and grief support.   11/09/2015 1400  Clinical Encounter Type  Visited With Patient and family together;Health care provider  Visit Type Initial;Spiritual support;Death  Referral From Nurse  Spiritual Encounters  Spiritual Needs Prayer;Emotional;Grief support  Stress Factors  Family Stress Factors Exhausted;Loss  Carel Schnee, [employer], [address]

## 2015-12-06 NOTE — Progress Notes (Signed)
In making my hourly rounds found patient not breathing, no BP, and respirations. Eyes with no reaction and sclera edema noted MD page and attempted to call patients daughter unable at this time to reach her. Left message just for her to call .

## 2015-12-06 NOTE — Progress Notes (Signed)
Agonal breathing Unresponsive No family at bedside  No recent vital signs-on 100% nonrebreather mask  On exam Agonal breathing Unresponsive Lungs: Coarse breath sounds-a lot of transmitted upper airway sounds CVS: S1-S2-appears bradycardic  Impression: Persistent encephalopathy post Status epilepticus Severe sepsis secondary to HCAP  Plan: Continue comfort measures-continue morphine infusion-suspect impending demise in the next few hours Had a long conversation with patient's daughter at bedside yesterday-currently none at bedside.

## 2015-12-06 NOTE — Progress Notes (Signed)
Nutrition Brief Note  Chart reviewed. Pt now transitioning to comfort care.  No further nutrition interventions warranted at this time.  Please re-consult as needed.   Edan Serratore A. Delorean Knutzen, RD, LDN, CDE Pager: [phone] After hours Pager: [phone]  

## 2015-12-06 NOTE — Progress Notes (Signed)
Daily Progress Note   Patient Name: Maryfrances BunnellDonald G Kimball       Date: 11/12/2015 DOB: 02/19/1941  Age: 75 y.o. MRN#: [id number] Attending Physician: Maretta BeesShanker M Ghimire, MD Primary Care Physician: Londell MohPHARR,WALTER DAVIDSON, MD Admit Date: Apr 26, 2015  Reason for Consultation/Follow-up: Establishing goals of care  Subjective: Mr. Cheral AlmasShackelford is progressing. Breathing is very shallow and irregular although no apnea noted at this time. Fingers cyanotic. Not responsive. Still on NRB and tachypneic. Joined at bedside by son and his wife. Explained exam findings and progression. Emotional support provided. Prognosis poor and could be hours.   Length of Stay: 12  Current Medications: Scheduled Meds:  . levETIRAcetam  1,000 mg Intravenous Q12H  . phenytoin (DILANTIN) IV  100 mg Intravenous Q8H    Continuous Infusions: . dextrose 5 % 1,000 mL infusion 10 mL/hr at 12/02/15 0942  . morphine 1 mg/hr (12/02/15 1611)    PRN Meds: acetaminophen, acetaminophen, antiseptic oral rinse, atropine, diphenhydrAMINE, glycopyrrolate, haloperidol **OR** haloperidol **OR** haloperidol lactate, ipratropium-albuterol, LORazepam, morphine, polyvinyl alcohol  Physical Exam  Constitutional: He appears well-developed.  HENT:  Head: Normocephalic and atraumatic.  Cardiovascular: Bradycardia present.   Pulmonary/Chest: Accessory muscle usage present. Tachypnea noted. No respiratory distress. He has decreased breath sounds. He has rhonchi.  Abdominal: Soft. Normal appearance.  Neurological: He is unresponsive.            Vital Signs: BP (!) 122/30 (BP Location: Right Arm)   Pulse 60   Temp 98.8 F (37.1 C) (Axillary)   Resp (!) 24   Ht 4' (1.219 m)   Wt 63.5 kg (139 lb 15.9 oz)   SpO2 (!) 89%   BMI 42.72 kg/m    SpO2: SpO2: (!) 89 % O2 Device: O2 Device: NRB O2 Flow Rate: O2 Flow Rate (L/min): 15 L/min  Intake/output summary:   Intake/Output Summary (Last 24 hours) at 11/26/2015 1124 Last data filed at 11/08/2015 0900  Gross per 24 hour  Intake           112.82 ml  Output              126 ml  Net           -13.18 ml   LBM: Last BM Date: 11/28/15 Baseline Weight: Weight: 74.7 kg (164 lb 10.9 oz) Most recent weight: Weight: 63.5  kg (139 lb 15.9 oz)       Palliative Assessment/Data: PPS: 10%     Patient Active Problem List   Diagnosis Date Noted  . Palliative care encounter   . Terminal care   . Seizure (HCC)   . PAF (paroxysmal atrial fibrillation) (HCC)   . Elevated liver function tests   . Persistent atrial fibrillation (HCC)   . CAD in native artery   . Malnutrition of moderate degree 11/24/2015  . Dyspnea   . Sepsis (HCC)   . Renal failure (ARF), acute on chronic (HCC)   . Endotracheal tube present   . Acute respiratory failure (HCC) 11/17/2015  . Healthcare-associated pneumonia   . Renal failure   . Weakness 06/23/2011  . Acute encephalopathy 06/23/2011  . Hypotension 06/23/2011  . Hypernatremia 06/23/2011  . Hyperchloremia 06/23/2011  . Prerenal azotemia 06/23/2011  . Macrocytosis without anemia 06/23/2011  . Tobacco abuse 06/23/2011  . 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 PVD 09/18/2007  . CELLULITIS AND ABSCESS OF UNSPECIFIED SITE 08/14/2007  . DM W/COMPLICATION NOS, TYPE II 10/04/2006  . GERD 10/04/2006  . RENAL INSUFFICIENCY, CHRONIC 10/04/2006  . INFECTION NOS, BONE, UNSPECIFIED SITE 10/04/2006  . STATUS, ARTHRODESIS 10/04/2006  . 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 male  with past medical history of COPD (smokes 3 packs/day per daughter), CAD status post CABG (2003), PAD status post bilateral BKA, dementia, paroxysmal atrial fibrillation no longer on  anticoagulation, ETOH abuse, from SNF admitted on 11/10/2015 with respiratory failure requiring intubation. Developed AF RVR and acute renal failure (resolved) as well as severe prolonged seizure activity on 9/23 and poorly responsive since.   Assessment: Lying in bed, agonal breathing, actively dying  Recommendations/Plan:  Pain/dyspnea: Morphine 1 mg/hr with bolus every 1-2 mg 15 minutes prn.   Secretions: Robinul prn.   Agitation/seizure: Ativan prn.   Goals of Care and Additional Recommendations:  Limitations on Scope of Treatment: Full Comfort Care  Code Status:    Code Status Orders        Start     Ordered   11/24/15 1050  Do not attempt resuscitation (DNR)  Continuous    Question Answer Comment  In the event of cardiac or respiratory ARREST Do not call a "code blue"   In the event of cardiac or respiratory ARREST Do not perform Intubation, CPR, defibrillation or ACLS   In the event of cardiac or respiratory ARREST Use medication by any route, position, wound care, and other measures to relive pain and suffering. May use oxygen, suction and manual treatment of airway obstruction as needed for comfort.   Comments NO pressors, NO HD, NO HEROICS      11/24/15 1049    Code Status History    Date Active Date Inactive Code Status Order ID Comments User Context   11/26/2015 12:34 PM 11/24/2015 10:49 AM Full Code [id number]  Oretha Milch, MD ED   06/23/2011  9:09 PM 06/26/2011  5:11 PM Full Code [id number]  Leta Baptist, RN ED       Prognosis:   Hours - Days  Discharge Planning:  Anticipated Hospital Death .   Thank you for allowing the Palliative Medicine Team to assist in the care of this patient.   Time In: 0930 Time Out: 0950 Total Time Prolonged Time Billed  no       Greater than 50%  of this  time was spent counseling and coordinating care related to the above assessment and plan.  Ulice Bold, NP  Please contact Palliative Medicine Team  phone at [phone] for questions and concerns.

## 2015-12-06 DEATH — deceased

## 2017-04-18 IMAGING — RF DG SWALLOWING FUNCTION - NRPT [facility]
1 series · 18 of 24 positions shown · non-contrast
Comparison: none

[Series 1: run · 22 acquisitions, 18 frames shown]
[im 1/22]
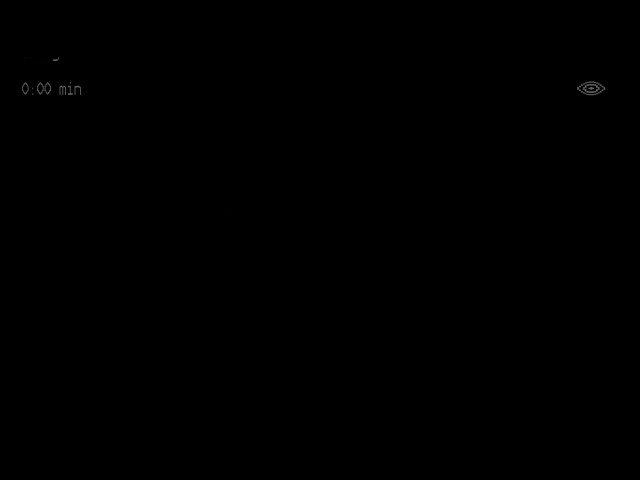
[im 3/22]
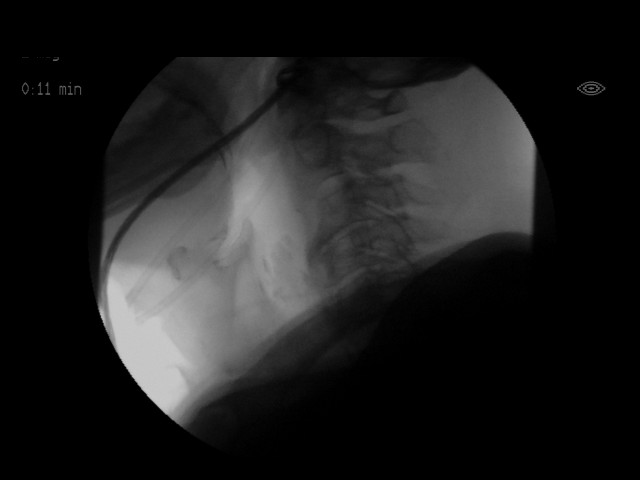
[im 3/22]
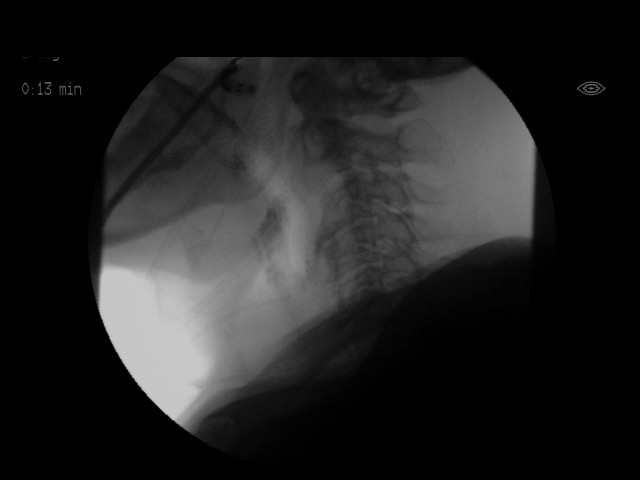
[im 4/22]
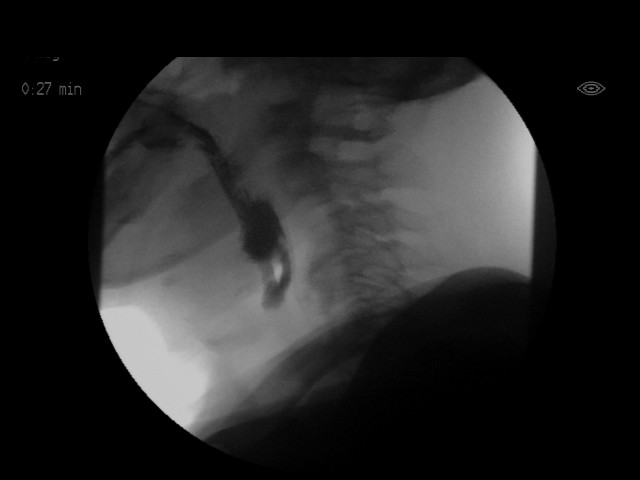
[im 6/22]
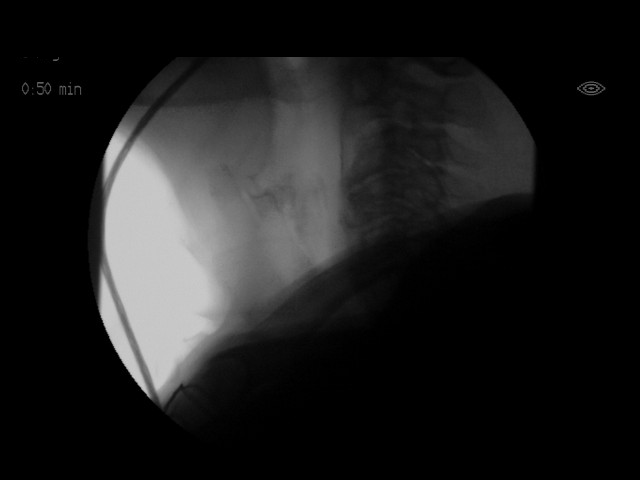
[im 7/22]
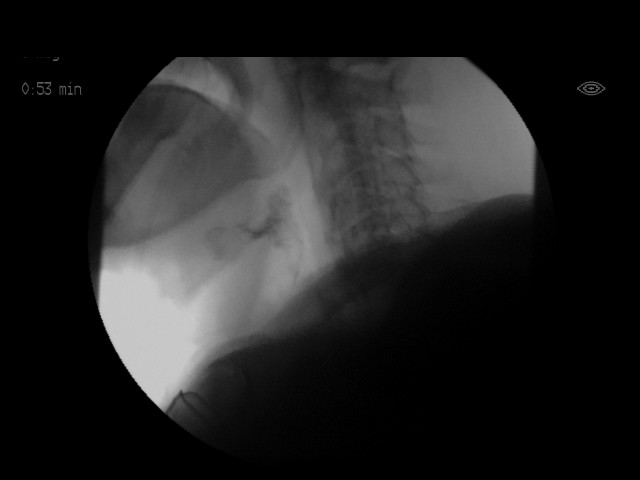
[im 8/22]
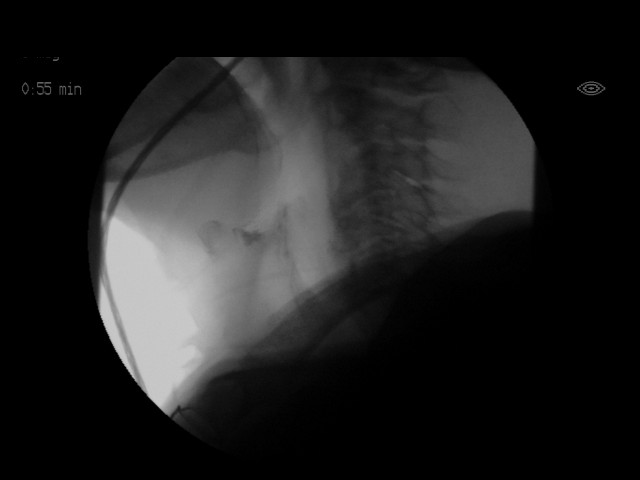
[im 10/22]
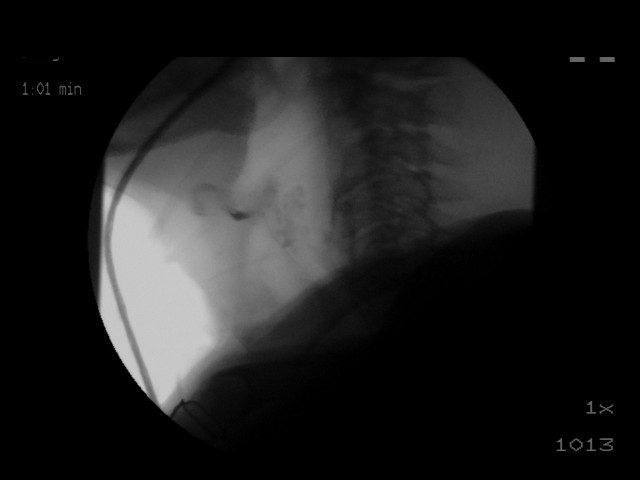
[im 11/22]
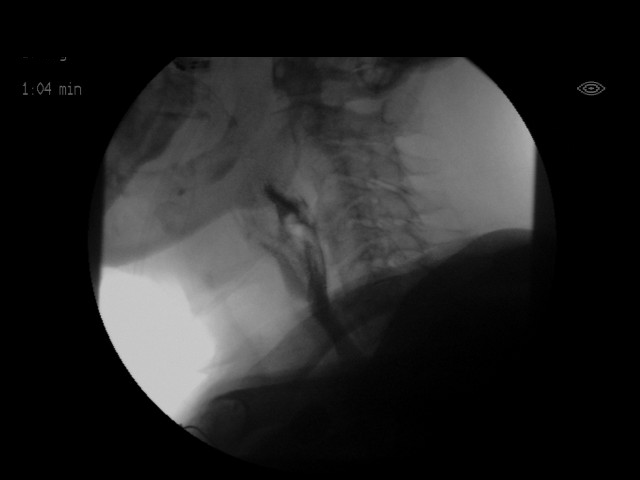
[im 12/22]
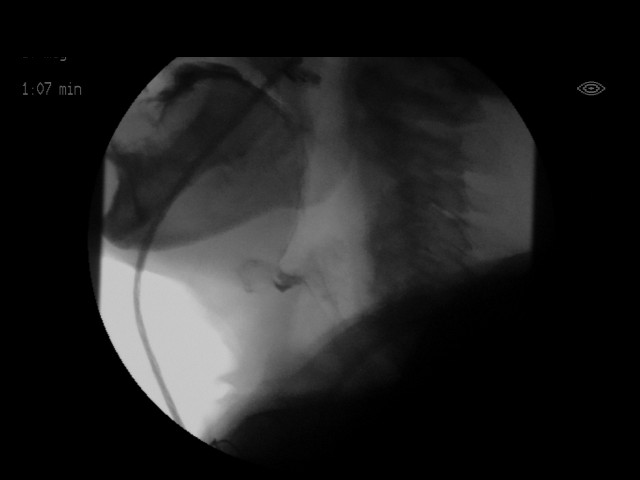
[im 14/22]
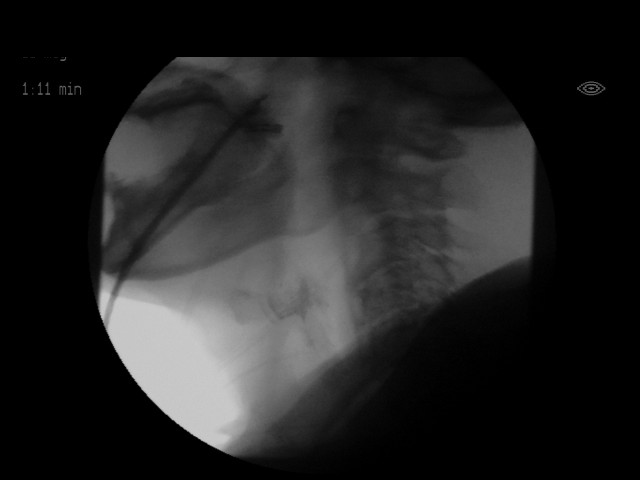
[im 15/22]
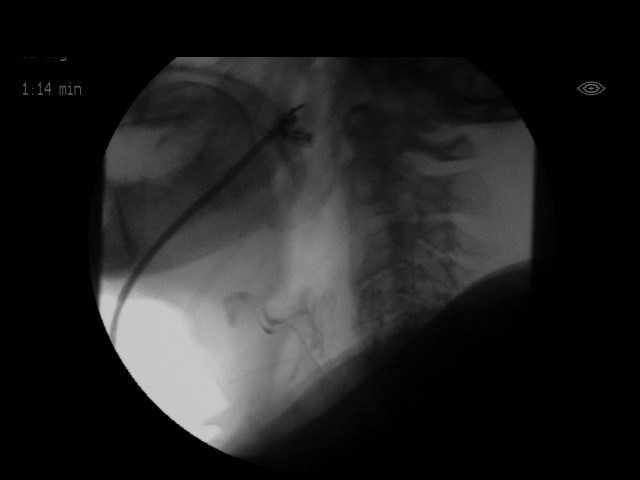
[im 16/22]
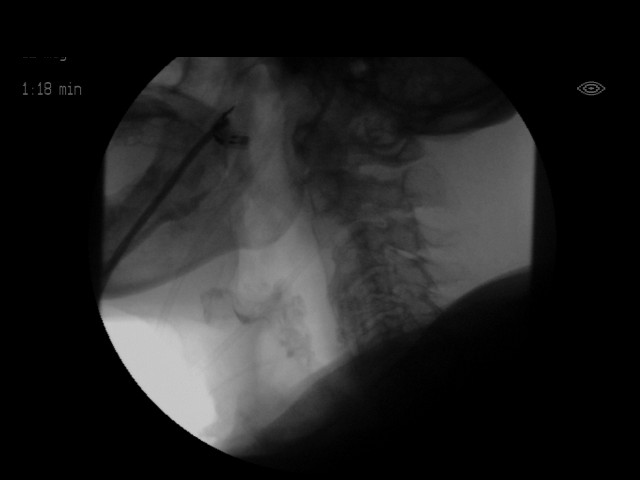
[im 18/22]
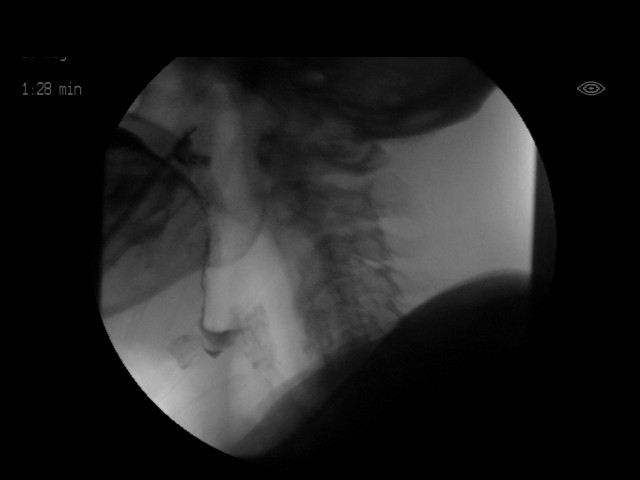
[im 19/22]
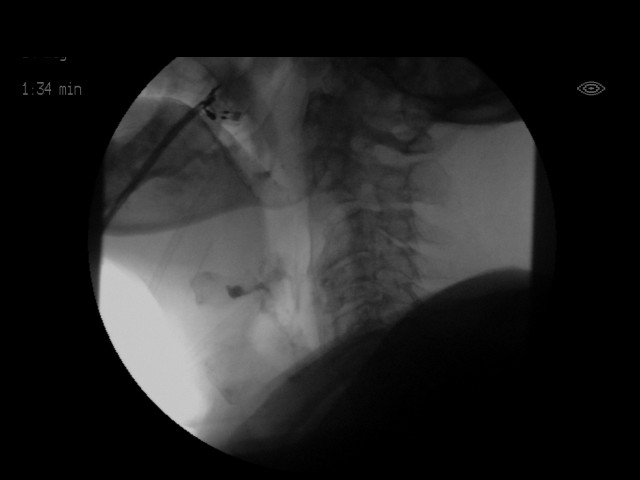
[im 20/22]
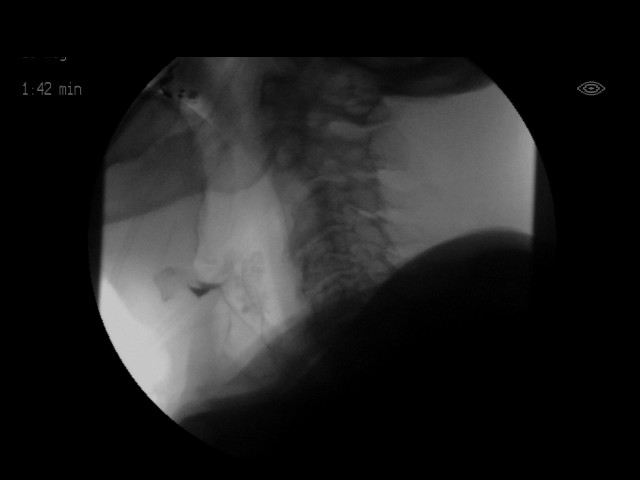
[im 21/22]
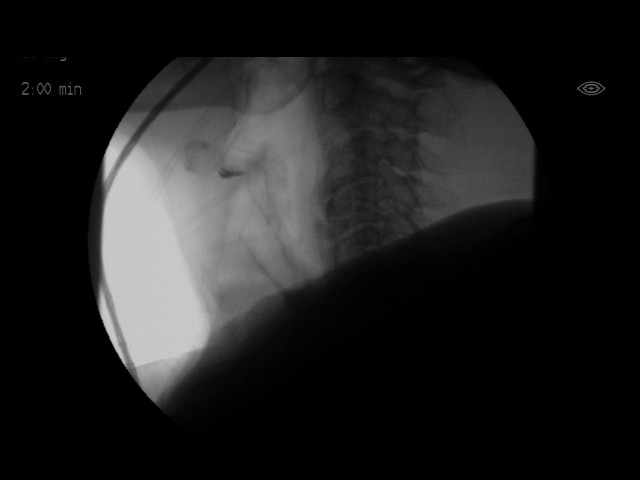
[im 22/22]
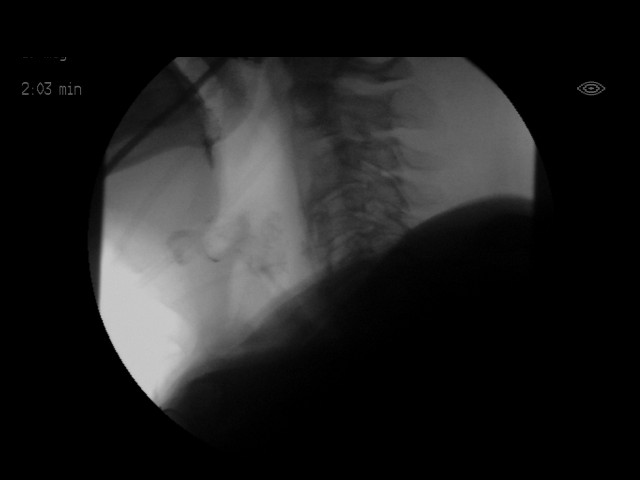

[18 of 24 positions shown; findings below may reference images not displayed]

FLUOROSCOPY FOR SWALLOWING FUNCTION STUDY:
Fluoroscopy was provided for swallowing function study, which was administered by a speech pathologist.  Final results and recommendations from this study are contained within the speech pathology report.

## 2017-04-20 IMAGING — DX DG CHEST 1V PORT
2 series · 2 of 2 positions shown · non-contrast
Comparison: 11/24/2019

CLINICAL DATA: Cough and congestion with shortness of Breath

EXAM:
PORTABLE CHEST 1 VIEW

[chest ap (1 of 2)]
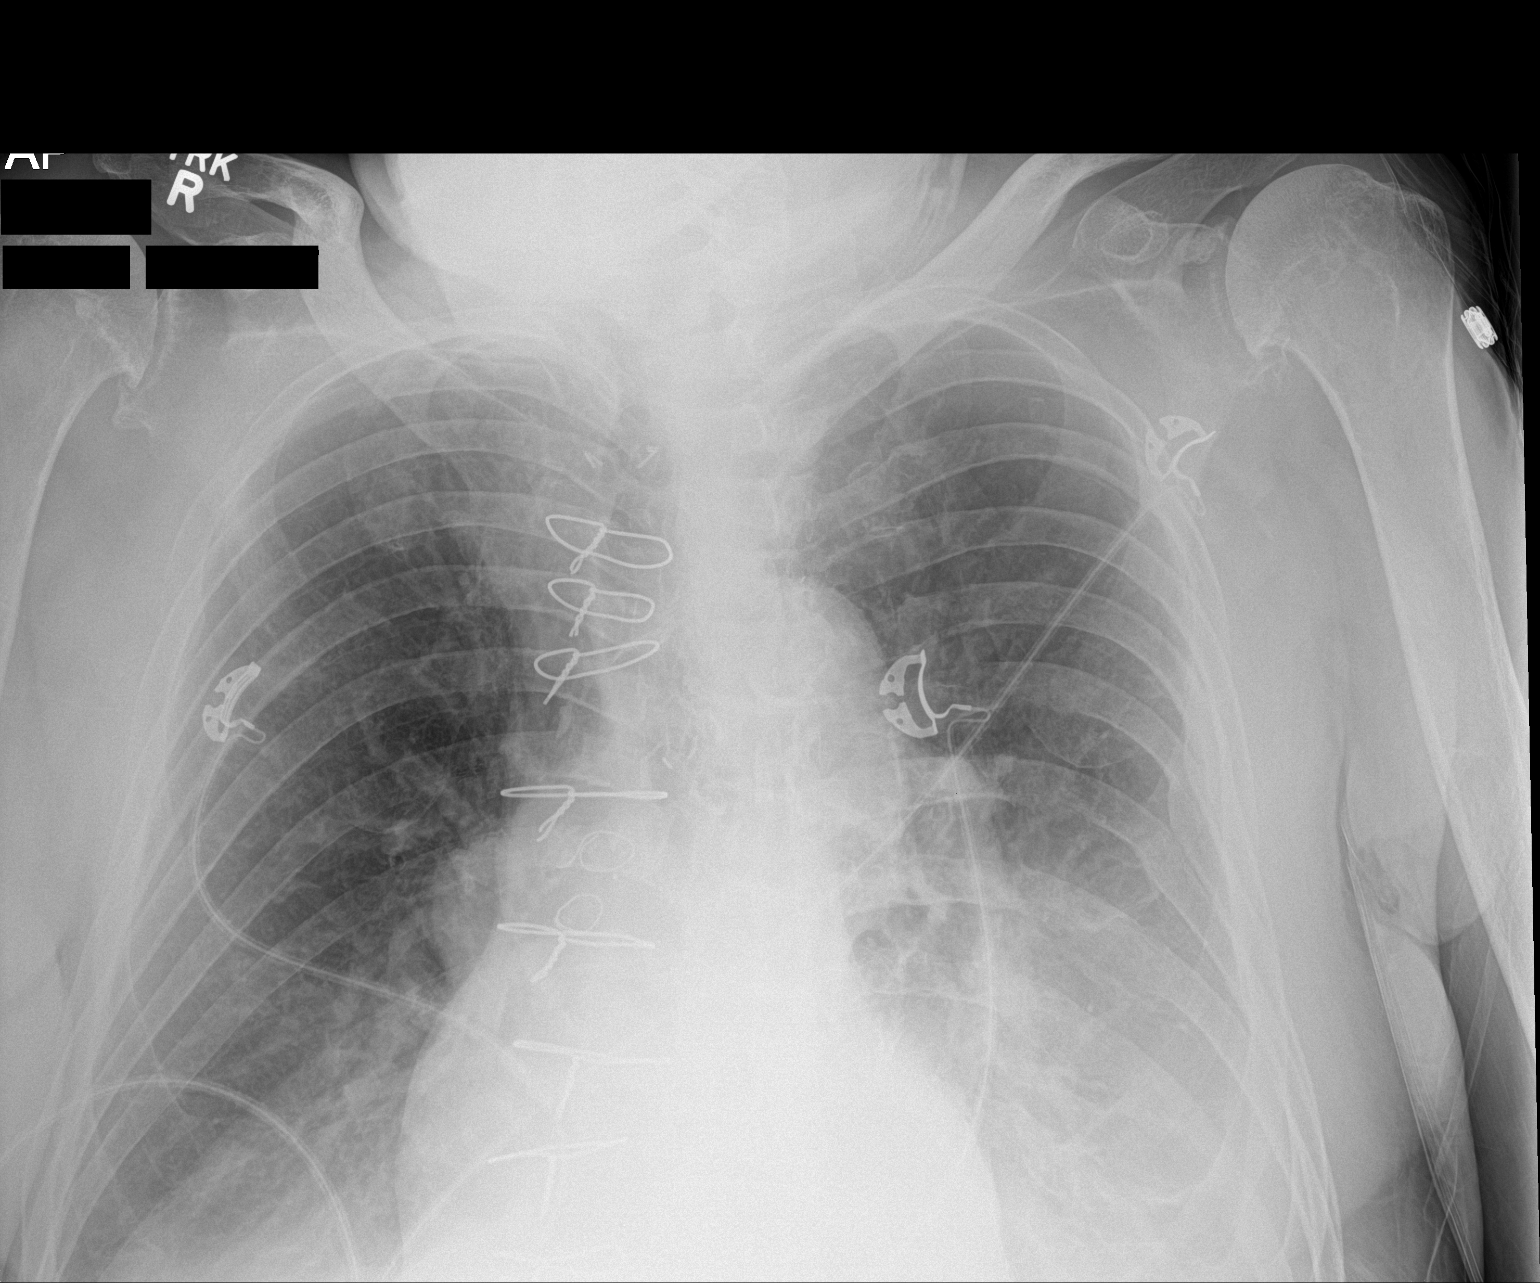

[chest ap (2 of 2)]
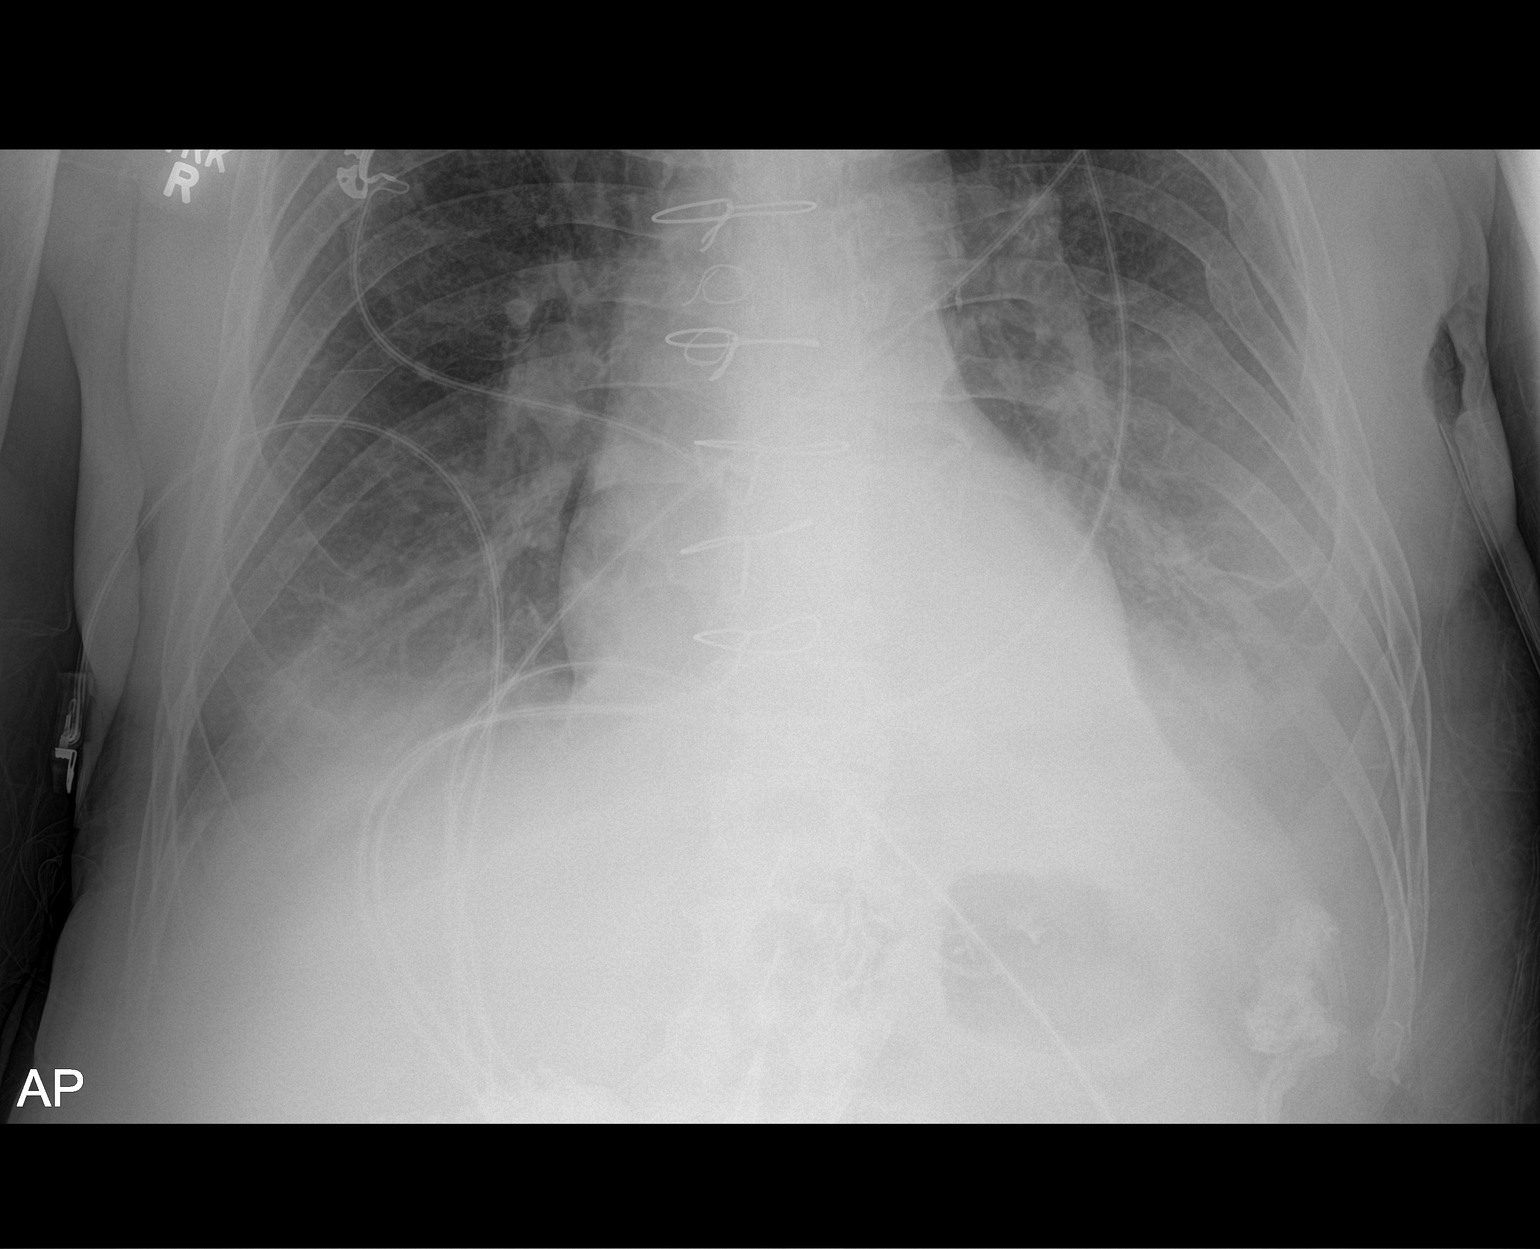

[2 of 2 positions shown; findings below may reference images not displayed]

FINDINGS: Cardiac shadow is stable. Postoperative changes are again seen. Left
pleural effusion is again identified and stable. Underlying basilar
atelectasis is noted. Smaller right pleural effusion is seen.
Changes of prior left rib fractures are noted.
IMPRESSION: Bilateral pleural effusions with atelectasis left greater than
right. The overall appearance is stable from the prior exam.
